# Patient Record
Sex: Female | Born: 1965
Health system: Southern US, Community
[De-identification: ages and names within clinical notes are randomized; demographics above are authoritative.]

## PROBLEM LIST (undated history)

## (undated) DIAGNOSIS — G4733 Obstructive sleep apnea (adult) (pediatric): Secondary | ICD-10-CM

## (undated) DIAGNOSIS — E785 Hyperlipidemia, unspecified: Secondary | ICD-10-CM

## (undated) DIAGNOSIS — R112 Nausea with vomiting, unspecified: Secondary | ICD-10-CM

## (undated) DIAGNOSIS — E0590 Thyrotoxicosis, unspecified without thyrotoxic crisis or storm: Secondary | ICD-10-CM

## (undated) DIAGNOSIS — G2581 Restless legs syndrome: Secondary | ICD-10-CM

## (undated) DIAGNOSIS — K219 Gastro-esophageal reflux disease without esophagitis: Secondary | ICD-10-CM

## (undated) DIAGNOSIS — J189 Pneumonia, unspecified organism: Secondary | ICD-10-CM

## (undated) DIAGNOSIS — I498 Other specified cardiac arrhythmias: Secondary | ICD-10-CM

## (undated) DIAGNOSIS — E538 Deficiency of other specified B group vitamins: Secondary | ICD-10-CM

## (undated) DIAGNOSIS — S129XXA Fracture of neck, unspecified, initial encounter: Secondary | ICD-10-CM

## (undated) DIAGNOSIS — D510 Vitamin B12 deficiency anemia due to intrinsic factor deficiency: Secondary | ICD-10-CM

## (undated) DIAGNOSIS — K760 Fatty (change of) liver, not elsewhere classified: Secondary | ICD-10-CM

## (undated) DIAGNOSIS — Z136 Encounter for screening for cardiovascular disorders: Secondary | ICD-10-CM

## (undated) DIAGNOSIS — G709 Myoneural disorder, unspecified: Secondary | ICD-10-CM

## (undated) DIAGNOSIS — G901 Familial dysautonomia [Riley-Day]: Secondary | ICD-10-CM

## (undated) DIAGNOSIS — G90A Postural orthostatic tachycardia syndrome (POTS): Secondary | ICD-10-CM

## (undated) DIAGNOSIS — I10 Essential (primary) hypertension: Secondary | ICD-10-CM

## (undated) DIAGNOSIS — N281 Cyst of kidney, acquired: Secondary | ICD-10-CM

## (undated) DIAGNOSIS — I951 Orthostatic hypotension: Secondary | ICD-10-CM

## (undated) DIAGNOSIS — R000 Tachycardia, unspecified: Secondary | ICD-10-CM

## (undated) DIAGNOSIS — Z9889 Other specified postprocedural states: Secondary | ICD-10-CM

## (undated) DIAGNOSIS — K5792 Diverticulitis of intestine, part unspecified, without perforation or abscess without bleeding: Secondary | ICD-10-CM

## (undated) DIAGNOSIS — I499 Cardiac arrhythmia, unspecified: Secondary | ICD-10-CM

## (undated) DIAGNOSIS — E041 Nontoxic single thyroid nodule: Secondary | ICD-10-CM

## (undated) DIAGNOSIS — R569 Unspecified convulsions: Secondary | ICD-10-CM

## (undated) DIAGNOSIS — G4730 Sleep apnea, unspecified: Secondary | ICD-10-CM

## (undated) DIAGNOSIS — K7460 Unspecified cirrhosis of liver: Secondary | ICD-10-CM

## (undated) DIAGNOSIS — K7581 Nonalcoholic steatohepatitis (NASH): Secondary | ICD-10-CM

## (undated) DIAGNOSIS — B2790 Infectious mononucleosis, unspecified without complication: Secondary | ICD-10-CM

## (undated) HISTORY — DX: Diverticulitis of intestine, part unspecified, without perforation or abscess without bleeding: K57.92

## (undated) HISTORY — DX: Nontoxic single thyroid nodule: E04.1

## (undated) HISTORY — PX: CHOLECYSTECTOMY: SHX55

## (undated) HISTORY — DX: Hyperlipidemia, unspecified: E78.5

## (undated) HISTORY — DX: Infectious mononucleosis, unspecified without complication: B27.90

## (undated) HISTORY — DX: Deficiency of other specified B group vitamins: E53.8

## (undated) HISTORY — DX: Unspecified cirrhosis of liver: K74.60

## (undated) HISTORY — DX: Fracture of neck, unspecified, initial encounter: S12.9XXA

## (undated) HISTORY — PX: APPENDECTOMY: SHX54

## (undated) HISTORY — DX: Restless legs syndrome: G25.81

## (undated) HISTORY — PX: ABDOMINAL HYSTERECTOMY: SHX81

## (undated) HISTORY — DX: Encounter for screening for cardiovascular disorders: Z13.6

## (undated) HISTORY — DX: Nonalcoholic steatohepatitis (NASH): K75.81

## (undated) HISTORY — DX: Cyst of kidney, acquired: N28.1

## (undated) HISTORY — DX: Familial dysautonomia (riley-day): G90.1

## (undated) HISTORY — DX: Gastro-esophageal reflux disease without esophagitis: K21.9

## (undated) HISTORY — DX: Obstructive sleep apnea (adult) (pediatric): G47.33

## (undated) HISTORY — DX: Sleep apnea, unspecified: G47.30

## (undated) HISTORY — DX: Vitamin B12 deficiency anemia due to intrinsic factor deficiency: D51.0

## (undated) HISTORY — PX: CARDIOVASCULAR STRESS TEST: SHX262

## (undated) HISTORY — PX: ANKLE SURGERY: SHX546

## (undated) HISTORY — PX: TONSILLECTOMY: SUR1361

## (undated) HISTORY — PX: OTHER SURGICAL HISTORY: SHX169

## (undated) HISTORY — PX: TONSILLECTOMY: SHX5217

## (undated) HISTORY — PX: CARDIAC CATHETERIZATION: SHX172

## (undated) HISTORY — PX: DOPPLER ECHOCARDIOGRAPHY: SHX263

---

## 1997-10-10 ENCOUNTER — Encounter: Payer: Self-pay | Admitting: Family Medicine

## 1997-10-10 LAB — CONVERTED CEMR LAB: Pap Smear: NORMAL

## 1999-03-11 ENCOUNTER — Ambulatory Visit (HOSPITAL_COMMUNITY): Admission: RE | Admit: 1999-03-11 | Discharge: 1999-03-11 | Payer: Self-pay | Admitting: Family Medicine

## 2000-01-12 DIAGNOSIS — Z136 Encounter for screening for cardiovascular disorders: Secondary | ICD-10-CM

## 2000-01-12 HISTORY — DX: Encounter for screening for cardiovascular disorders: Z13.6

## 2000-01-15 ENCOUNTER — Encounter: Admission: RE | Admit: 2000-01-15 | Discharge: 2000-01-15 | Payer: Self-pay | Admitting: Family Medicine

## 2000-01-15 ENCOUNTER — Encounter: Payer: Self-pay | Admitting: Family Medicine

## 2000-02-23 ENCOUNTER — Encounter: Payer: Self-pay | Admitting: Family Medicine

## 2000-02-23 ENCOUNTER — Encounter: Admission: RE | Admit: 2000-02-23 | Discharge: 2000-02-23 | Payer: Self-pay | Admitting: Family Medicine

## 2000-03-13 HISTORY — PX: ESOPHAGOGASTRODUODENOSCOPY: SHX1529

## 2000-06-22 ENCOUNTER — Ambulatory Visit (HOSPITAL_COMMUNITY): Admission: RE | Admit: 2000-06-22 | Discharge: 2000-06-22 | Payer: Self-pay | Admitting: Internal Medicine

## 2001-04-13 HISTORY — PX: BLADDER REPAIR: SHX76

## 2001-04-28 ENCOUNTER — Encounter: Admission: RE | Admit: 2001-04-28 | Discharge: 2001-04-28 | Payer: Self-pay | Admitting: Internal Medicine

## 2001-06-02 ENCOUNTER — Observation Stay (HOSPITAL_COMMUNITY): Admission: RE | Admit: 2001-06-02 | Discharge: 2001-06-03 | Payer: Self-pay | Admitting: Urology

## 2001-10-03 ENCOUNTER — Ambulatory Visit (HOSPITAL_BASED_OUTPATIENT_CLINIC_OR_DEPARTMENT_OTHER): Admission: RE | Admit: 2001-10-03 | Discharge: 2001-10-03 | Payer: Self-pay | Admitting: Urology

## 2002-02-28 ENCOUNTER — Encounter: Payer: Self-pay | Admitting: Family Medicine

## 2002-02-28 ENCOUNTER — Encounter: Admission: RE | Admit: 2002-02-28 | Discharge: 2002-02-28 | Payer: Self-pay | Admitting: Family Medicine

## 2002-12-13 HISTORY — PX: OTHER SURGICAL HISTORY: SHX169

## 2003-01-29 ENCOUNTER — Encounter: Payer: Self-pay | Admitting: Family Medicine

## 2003-01-29 ENCOUNTER — Emergency Department (HOSPITAL_COMMUNITY): Admission: EM | Admit: 2003-01-29 | Discharge: 2003-01-29 | Payer: Self-pay | Admitting: Emergency Medicine

## 2003-04-14 DIAGNOSIS — B2790 Infectious mononucleosis, unspecified without complication: Secondary | ICD-10-CM

## 2003-04-14 HISTORY — DX: Infectious mononucleosis, unspecified without complication: B27.90

## 2003-08-13 ENCOUNTER — Encounter: Admission: RE | Admit: 2003-08-13 | Discharge: 2003-08-13 | Payer: Self-pay | Admitting: Cardiology

## 2004-01-20 ENCOUNTER — Emergency Department (HOSPITAL_COMMUNITY): Admission: EM | Admit: 2004-01-20 | Discharge: 2004-01-20 | Payer: Self-pay

## 2004-05-09 ENCOUNTER — Ambulatory Visit: Payer: Self-pay | Admitting: Family Medicine

## 2004-06-02 ENCOUNTER — Ambulatory Visit: Payer: Self-pay | Admitting: Internal Medicine

## 2004-08-21 ENCOUNTER — Ambulatory Visit: Payer: Self-pay | Admitting: Family Medicine

## 2004-08-23 ENCOUNTER — Emergency Department: Payer: Self-pay | Admitting: Emergency Medicine

## 2004-08-26 ENCOUNTER — Other Ambulatory Visit: Admission: RE | Admit: 2004-08-26 | Discharge: 2004-08-26 | Payer: Self-pay | Admitting: Obstetrics and Gynecology

## 2004-09-05 ENCOUNTER — Emergency Department: Payer: Self-pay | Admitting: Internal Medicine

## 2004-09-15 ENCOUNTER — Encounter: Admission: RE | Admit: 2004-09-15 | Discharge: 2004-09-15 | Payer: Self-pay | Admitting: Family Medicine

## 2004-09-15 LAB — HM MAMMOGRAPHY: HM Mammogram: NORMAL

## 2005-01-15 ENCOUNTER — Ambulatory Visit: Payer: Self-pay | Admitting: Family Medicine

## 2005-01-30 ENCOUNTER — Ambulatory Visit: Payer: Self-pay | Admitting: Gastroenterology

## 2005-02-11 ENCOUNTER — Ambulatory Visit: Payer: Self-pay | Admitting: Gastroenterology

## 2005-02-11 ENCOUNTER — Encounter (INDEPENDENT_AMBULATORY_CARE_PROVIDER_SITE_OTHER): Payer: Self-pay | Admitting: Specialist

## 2005-02-17 ENCOUNTER — Encounter: Admission: RE | Admit: 2005-02-17 | Discharge: 2005-04-12 | Payer: Self-pay | Admitting: Gastroenterology

## 2005-02-18 ENCOUNTER — Ambulatory Visit: Payer: Self-pay | Admitting: Gastroenterology

## 2005-02-25 ENCOUNTER — Ambulatory Visit: Payer: Self-pay | Admitting: Gastroenterology

## 2005-03-17 ENCOUNTER — Ambulatory Visit: Payer: Self-pay | Admitting: Gastroenterology

## 2005-03-24 ENCOUNTER — Ambulatory Visit: Payer: Self-pay | Admitting: Cardiology

## 2005-04-02 ENCOUNTER — Ambulatory Visit: Payer: Self-pay | Admitting: Gastroenterology

## 2005-05-12 ENCOUNTER — Ambulatory Visit: Payer: Self-pay | Admitting: Gastroenterology

## 2005-05-21 ENCOUNTER — Ambulatory Visit: Payer: Self-pay | Admitting: Gastroenterology

## 2005-05-22 ENCOUNTER — Ambulatory Visit (HOSPITAL_COMMUNITY): Admission: RE | Admit: 2005-05-22 | Discharge: 2005-05-22 | Payer: Self-pay | Admitting: Gastroenterology

## 2005-05-25 ENCOUNTER — Ambulatory Visit (HOSPITAL_COMMUNITY): Admission: RE | Admit: 2005-05-25 | Discharge: 2005-05-26 | Payer: Self-pay | Admitting: Urology

## 2005-11-21 ENCOUNTER — Emergency Department (HOSPITAL_COMMUNITY): Admission: EM | Admit: 2005-11-21 | Discharge: 2005-11-21 | Payer: Self-pay | Admitting: Family Medicine

## 2005-12-03 ENCOUNTER — Ambulatory Visit: Payer: Self-pay | Admitting: Family Medicine

## 2005-12-18 ENCOUNTER — Ambulatory Visit: Payer: Self-pay | Admitting: Internal Medicine

## 2005-12-30 ENCOUNTER — Ambulatory Visit: Payer: Self-pay | Admitting: Family Medicine

## 2006-02-09 ENCOUNTER — Ambulatory Visit: Payer: Self-pay | Admitting: Gastroenterology

## 2006-07-13 HISTORY — PX: CARDIOVASCULAR STRESS TEST: SHX262

## 2006-08-05 ENCOUNTER — Encounter: Payer: Self-pay | Admitting: Family Medicine

## 2006-08-17 ENCOUNTER — Encounter: Payer: Self-pay | Admitting: Family Medicine

## 2006-09-02 ENCOUNTER — Encounter: Payer: Self-pay | Admitting: Family Medicine

## 2006-09-08 ENCOUNTER — Encounter: Payer: Self-pay | Admitting: Family Medicine

## 2006-09-08 DIAGNOSIS — R079 Chest pain, unspecified: Secondary | ICD-10-CM | POA: Insufficient documentation

## 2006-09-08 DIAGNOSIS — E785 Hyperlipidemia, unspecified: Secondary | ICD-10-CM | POA: Insufficient documentation

## 2006-09-08 DIAGNOSIS — E538 Deficiency of other specified B group vitamins: Secondary | ICD-10-CM | POA: Insufficient documentation

## 2006-09-08 DIAGNOSIS — K219 Gastro-esophageal reflux disease without esophagitis: Secondary | ICD-10-CM | POA: Insufficient documentation

## 2006-09-08 DIAGNOSIS — G4733 Obstructive sleep apnea (adult) (pediatric): Secondary | ICD-10-CM | POA: Insufficient documentation

## 2006-09-08 DIAGNOSIS — G43909 Migraine, unspecified, not intractable, without status migrainosus: Secondary | ICD-10-CM | POA: Insufficient documentation

## 2006-09-08 DIAGNOSIS — N3946 Mixed incontinence: Secondary | ICD-10-CM | POA: Insufficient documentation

## 2006-09-09 ENCOUNTER — Ambulatory Visit: Payer: Self-pay | Admitting: Family Medicine

## 2006-09-09 DIAGNOSIS — I10 Essential (primary) hypertension: Secondary | ICD-10-CM | POA: Insufficient documentation

## 2006-09-09 DIAGNOSIS — R7989 Other specified abnormal findings of blood chemistry: Secondary | ICD-10-CM | POA: Insufficient documentation

## 2006-09-09 DIAGNOSIS — R4589 Other symptoms and signs involving emotional state: Secondary | ICD-10-CM | POA: Insufficient documentation

## 2006-10-24 ENCOUNTER — Encounter: Payer: Self-pay | Admitting: Family Medicine

## 2006-11-04 ENCOUNTER — Encounter: Payer: Self-pay | Admitting: Family Medicine

## 2006-11-19 ENCOUNTER — Ambulatory Visit: Payer: Self-pay | Admitting: Cardiology

## 2006-12-02 ENCOUNTER — Encounter: Payer: Self-pay | Admitting: Family Medicine

## 2007-03-31 ENCOUNTER — Telehealth: Payer: Self-pay | Admitting: Family Medicine

## 2007-07-08 ENCOUNTER — Encounter: Payer: Self-pay | Admitting: Family Medicine

## 2007-07-21 ENCOUNTER — Encounter: Payer: Self-pay | Admitting: Family Medicine

## 2008-01-09 ENCOUNTER — Encounter: Payer: Self-pay | Admitting: Family Medicine

## 2008-01-25 ENCOUNTER — Encounter (INDEPENDENT_AMBULATORY_CARE_PROVIDER_SITE_OTHER): Payer: Self-pay | Admitting: *Deleted

## 2008-04-19 ENCOUNTER — Encounter: Payer: Self-pay | Admitting: Family Medicine

## 2008-06-06 ENCOUNTER — Encounter: Payer: Self-pay | Admitting: Family Medicine

## 2008-08-06 ENCOUNTER — Telehealth: Payer: Self-pay | Admitting: Family Medicine

## 2008-12-20 ENCOUNTER — Encounter: Payer: Self-pay | Admitting: Family Medicine

## 2009-04-13 ENCOUNTER — Emergency Department (HOSPITAL_COMMUNITY): Admission: EM | Admit: 2009-04-13 | Discharge: 2009-04-13 | Payer: Self-pay | Admitting: Emergency Medicine

## 2009-08-02 ENCOUNTER — Encounter: Payer: Self-pay | Admitting: Family Medicine

## 2009-08-05 ENCOUNTER — Telehealth: Payer: Self-pay | Admitting: Family Medicine

## 2009-10-22 ENCOUNTER — Encounter: Admission: RE | Admit: 2009-10-22 | Discharge: 2009-10-22 | Payer: Self-pay | Admitting: Obstetrics and Gynecology

## 2009-12-19 ENCOUNTER — Encounter: Payer: Self-pay | Admitting: Family Medicine

## 2010-05-04 ENCOUNTER — Encounter: Payer: Self-pay | Admitting: Cardiology

## 2010-05-13 NOTE — Letter (Signed)
Summary: [facility]   Imported By: Edmonia James 01/07/2010 08:19:36  _____________________________________________________________________  External Attachment:    Type:   Image     Comment:   External Document

## 2010-05-13 NOTE — Letter (Signed)
Summary: [facility]   Imported By: Edmonia James 01/10/2010 09:23:49  _____________________________________________________________________  External Attachment:    Type:   Image     Comment:   External Document

## 2010-05-13 NOTE — Progress Notes (Signed)
Summary: Rx Vitamin B12  Phone Note From Pharmacy Call back at [phone]   Caller: [facility] Call For: Dr. Glori Bickers  Summary of Call: Received fax from pharmacy stating that a refill is needed on Cyanocobalamin injection 1037mg monthly.  Patient has not had B12 levels drawn recently, please advise. Initial call taken by: NSherrian DiversCMA (Deborra Medina,  August 05, 2009 11:00 AM  Follow-up for Phone Call        that is ok- she has level done at another office  px written on EMR for call in  Follow-up by: MAllena EaringMD,  August 05, 2009 12:57 PM    Prescriptions: CYANOCOBALAMIN 1000 MCG/ML SOLN (CYANOCOBALAMIN) take injections as directed monthly  #3 months x 3   Entered by:   LChristena DeemCMA ([place]   Authorized by:   MAllena EaringMD   Signed by:   LChristena DeemCMA (Deborra Medina on 08/05/2009   Method used:   [facility]        [facility]       [address]      [place]  [address]      Ph: [phone]      Fax: [phone]  RxID:   [id number]

## 2010-06-29 LAB — BASIC METABOLIC PANEL
Calcium: 9.6 mg/dL (ref 8.4–10.5)
GFR calc non Af Amer: >60 mL/min (ref >60)
Sodium: 139 mEq/L (ref 135–145)

## 2010-06-29 LAB — CBC
HCT: 43 % (ref 36.0–46.0)
Hemoglobin: 14.8 g/dL (ref 12.0–15.0)
Platelets: 232 10*3/uL (ref 150–400)
WBC: 6.8 10*3/uL (ref 4.0–10.5)

## 2010-06-29 LAB — DIFFERENTIAL
Eosinophils Relative: 2 % (ref 0–5)
Lymphocytes Relative: 30 % (ref 12–46)
Lymphs Abs: 2.1 10*3/uL (ref 0.7–4.0)
Monocytes Absolute: 0.4 10*3/uL (ref 0.1–1.0)
Monocytes Relative: 6 % (ref 3–12)
Neutro Abs: 4.2 10*3/uL (ref 1.7–7.7)

## 2010-08-14 ENCOUNTER — Ambulatory Visit (INDEPENDENT_AMBULATORY_CARE_PROVIDER_SITE_OTHER): Payer: BC Managed Care – PPO | Admitting: Family Medicine

## 2010-08-14 ENCOUNTER — Encounter: Payer: Self-pay | Admitting: Family Medicine

## 2010-08-14 ENCOUNTER — Telehealth: Payer: Self-pay | Admitting: Gastroenterology

## 2010-08-14 ENCOUNTER — Ambulatory Visit: Payer: Self-pay | Admitting: Family Medicine

## 2010-08-14 VITALS — BP 116/82 | HR 92 | Temp 98.4°F | Wt 199.0 lb

## 2010-08-14 DIAGNOSIS — R109 Unspecified abdominal pain: Secondary | ICD-10-CM

## 2010-08-14 LAB — CBC WITH DIFFERENTIAL/PLATELET
Basophils Absolute: 0 10*3/uL (ref 0.0–0.1)
Basophils Relative: 0 % (ref 0–1)
Eosinophils Absolute: 0.1 10*3/uL (ref 0.0–0.7)
Eosinophils Relative: 1 % (ref 0–5)
HCT: 45.3 % (ref 36.0–46.0)
Hemoglobin: 15.7 g/dL — ABNORMAL HIGH (ref 12.0–15.0)
Lymphocytes Relative: 35 % (ref 12–46)
MCHC: 34.7 g/dL (ref 30.0–36.0)
Monocytes Absolute: 0.5 10*3/uL (ref 0.1–1.0)
Monocytes Relative: 7 % (ref 3–12)
Neutro Abs: 4.2 10*3/uL (ref 1.7–7.7)
Neutrophils Relative %: 57 % (ref 43–77)
RBC: 5.17 MIL/uL — ABNORMAL HIGH (ref 3.87–5.11)
WBC: 7.3 10*3/uL (ref 4.0–10.5)

## 2010-08-14 LAB — POCT URINALYSIS DIPSTICK
Leukocytes, UA: NEGATIVE
Protein, UA: NEGATIVE
Urobilinogen, UA: NEGATIVE
pH, UA: 6

## 2010-08-14 LAB — COMPREHENSIVE METABOLIC PANEL
ALT: 46 U/L — ABNORMAL HIGH (ref 0–35)
CO2: 31 mEq/L (ref 19–32)
Calcium: 10.3 mg/dL (ref 8.4–10.5)
Chloride: 98 mEq/L (ref 96–112)
Creat: 0.83 mg/dL (ref 0.40–1.20)
Glucose, Bld: 148 mg/dL — ABNORMAL HIGH (ref 70–99)
Total Protein: 7.4 g/dL (ref 6.0–8.3)

## 2010-08-14 NOTE — Patient Instructions (Signed)
See Rosaria Ferries about your referral before your leave today. I'll talk to you when they call the report to me.

## 2010-08-14 NOTE — Telephone Encounter (Signed)
Per patient's chart in [facility], she has been seeing Dr. Thana Farr at [facility]. Called patient and requested she call them first for this problem.

## 2010-08-14 NOTE — Assessment & Plan Note (Signed)
>  45 min spent with patient counseling and coordinating care.  Labs reviewed.  She was sent for CT and I have d/w radiology and pt.  She has no overt signs of acute appendicitis, but she does have a prominent appendix with mild LA per report in the RLQ.  I have advised pt to go to ER and have eval there, esp given her h/o RLQ pain.  She may benefit from surgical eval.  I have called the [facility] ER about patient's pending arrival.  I will await further recs from [facility].  I appreciate help of all involved.

## 2010-08-14 NOTE — Progress Notes (Signed)
Abdominal pain.  H/o NASH followed at [facility].  Last week with some pain near the umbilicus that radiated to the R side.  She had had RUQ pain like that before (but not the pain at umbilicus) and now she had some RLQ pain additionally.  Better now than this AM.  H/o diverticulitis 2 years ago, LLQ.  She felt episodically hot but had no fevers over the last few days.  No nausea now, had some prev.  No vomiting.  No change in stools. No BRBPR.  No dark tarry stools.  No burning with urination.  S/p hysterectomy.    Meds, vitals, and allergies reviewed.   ROS: See HPI.  Otherwise, noncontributory.  nad but uncomfortable. Mmm rrr ctab Abdomen soft, ttp at umbilicus and RUQ, more ttp in RLQ.  No peritoneal sign.  Normal bs Ext well perfused

## 2010-08-15 ENCOUNTER — Encounter: Payer: Self-pay | Admitting: Family Medicine

## 2010-08-15 ENCOUNTER — Observation Stay: Payer: Self-pay | Admitting: General Surgery

## 2010-08-15 DIAGNOSIS — K37 Unspecified appendicitis: Secondary | ICD-10-CM | POA: Insufficient documentation

## 2010-08-19 ENCOUNTER — Encounter: Payer: Self-pay | Admitting: Family Medicine

## 2010-08-19 ENCOUNTER — Ambulatory Visit: Payer: Self-pay | Admitting: General Surgery

## 2010-08-29 NOTE — Op Note (Signed)
[facility]  Patient:    [name], [name] Visit Number: [id number] MRN: [id number]          Service Type: Attending:  Shane Crutch. Gaynelle Arabian, M.D. Dictated by:   Shane Crutch Gaynelle Arabian, M.D. Proc. Date: 06/02/01   CC:         Mardene Celeste A. Rocky Link, M.D. [facility]   Operative Report  PREOPERATIVE DIAGNOSES: 1. Urinary incontinence. 2. Vaginal vault prolapse.  POSTOPERATIVE DIAGNOSES: 1. Urinary incontinence. 2. Vaginal vault prolapse.  OPERATION:  SPARC pubovaginal sling and anterior vaginal vault repair.  SURGEON:  Sigmund I. Gaynelle Arabian, M.D.  ANESTHESIA:  General (LMA).  PREPARATION:  After appropriate preanesthesia, the patient is brought to the operating room and placed on the operating table in the dorsal supine position where a general LMA anesthesia was introduced.  She was re-placed in the low Allen stirrup dorsal lithotomy position where the pubis was prepped with Betadine solution and draped in the usual fashion.  REVIEW OF HISTORY:  This 45 year old female, had a combination of hysterectomy and bladder repair with apparent anterior vaginal vault repair and Raz urethropexy in [place], [place], in approximately 1994.  The patient was apparently obstructed at that time, developing severe urinary urgency and frequency.  She was seen at [facility] postoperatively and had not done well since that time.  She has remained with urge incontinence and currently complains of a mixed stress and urge incontinence.  She was originally evaluated in 1998, with a diagnosis of type 3 urinary incontinence, losing urine when she coughs, laughs, and sneezes.  She was not prepared at that time to have surgical repair but has since been counseled to have a permanent urethropexy and anterior vaginal vault repair.  The patient does not smoke, and was ______.  She did have significant bleeding with her prior urologic procedure, necessitating  transfusion.  Note that the patient has a stable, compliant bladder on urodynamics with a leak point pressure of only 16 cc per second and a total bladder volume of 400 cc.  DESCRIPTION OF PROCEDURE:  A total of 8 cc of 0.5% plain Marcaine with epinephrine 1:200,000 was injected in two areas, in the mid urethra area, and also along the longitudinal portion of the vaginal vault prolapse.  Incision was made vertically, subcutaneous tissue dissected laterally.  Two separate incisions were then made in the pubic area, approximately two fingerbreadths from the midline on either side.  Each incision was approximately 1 cm in length.  The [facility] needles were then placed retropubically and through the mid urethral individual.  Once both needles were in position, cystoscopy was performed which showed no suture within the bladder or the urethra.  The Foley was replaced, and the [facility] sling was placed without difficulty, cut in perfect position.  It is noted that the sling was placed with a right-angle clamp behind the urethral loop at all times, and there was a loop left in the [facility] material after placement.  The sides were then cut below the skin and subcu fat layer and could not be palpated or visualized from the suprapubic wound sites.  The wound sites were closed with 4-0 Vicryl.  Benzoin and Steri-Strips and sterile dressing.  It was elected to place a 2 cm x 7 cm portion of Tutoplast fascia over the sling, and this was accomplished without difficulty.  The incision site was then closed in two layers with 4-0 Vicryl suture.  Incision was then made in the anterior vaginal prolapse longitudinally, subcutaneous tissue dissected  lateralward.  Three separate 3-0 Vicryl horizontal mattress sutures were then placed, reducing the cystocele, and a 4 cm x 7 cm portion of Tutoplast fascia was then used to cover the repair. The anterior vaginal wall was then closed in two layers with 3-0  Vicryl suture, interrupted and running.  Following this, vaginal packing was placed. The patient was given IV Toradol, awakened, and taken to the recovery room in good condition. Dictated by:   Shane Crutch Gaynelle Arabian, M.D. Attending:  Shane Crutch. Gaynelle Arabian, M.D. DD:  06/02/01 TD:  06/02/01 Job: [id number]

## 2010-08-29 NOTE — Procedures (Signed)
[facility]  Patient:    [name], [name]                     MRN: [id number] Proc. Date: 06/29/00 Adm. Date:  [id number] Disc. Date: [id number] Attending:  Donnajean Lopes CC:         Loura Pardon, M.D. [facility]   Procedure Report  PROCEDURE PERFORMED:  Esophageal manometry.  1 - Upper esophageal sphincter.  Appears to be normal coordination between pharyngeal contraction and cricopharyngeal relaxation.  2 - Lower esophageal sphincter.  Mean pressure is normal at 14 mmHg with normal relaxation to swallowing.  3 - Motility pattern.  There are normally propagated peristaltic waves of normal amplitude and duration throughout the length of the esophagus.  Mean distal esophageal amplitude is 104 mmHg and there is 100% peristalsis.  ASSESSMENT:  This is normal esophageal manometry without evidence of esophageal motility disorder to account for this patients atypical chest pain.  She also had a normal 24-hour pH probe study performed in our office on May 24, 2000.  RECOMMENDATIONS:  I will refer this patient back to primary care for further evaluation.  I can see no evidence of esophageal abnormalities in this patient.  She also had normal endoscopy performed on March 31, 2000. DD:  06/29/00 TD:  06/30/00 Job: [id number]

## 2010-08-29 NOTE — Op Note (Signed)
[facility]  Patient:    [name], [name] Visit Number: [id number] MRN: [id number]          Service Type: NES Location: [place] Attending Physician:  Veverly Fells Dictated by:   Shane Crutch Gaynelle Arabian, M.D. Proc. Date: 10/03/01 Admit Date:  10/03/2001                             Operative Report  PREOPERATIVE DIAGNOSIS:  Sphincteric urinary incontinence.  POSTOPERATIVE DIAGNOSIS:  Sphincteric urinary incontinence.  OPERATION PERFORMED:  Injection of collagen periurethrally and vaginal scar revision.  SURGEON:  Sigmund I. Gaynelle Arabian, M.D.  ANESTHESIA:  General (standby).  PREPARATION:  After appropriate preanesthesia, the patient was placed on the operating table in the dorsal supine position. The patient had IV sedation given, and her legs were placed in the dorsal lithotomy position where the pubis was prepped with Betadine solution and draped in the usual fashion.  DESCRIPTION OF PROCEDURE:  Vaginal inspection revealed that the patient had immediate neurogenic component to her bladder, with spasming of urine and incontinence when sedated. Her recent pubovaginal sling appeared in good position, but the posterior portion of her vaginal vault appeared quite weak.  Cystoscopy was accomplished, and two vials of collagen were injected circumferentially, yielding a donut shaped mucosal form, which definitely stops the incontinence. In addition, scar was identified from previous sling, and 1 cc of 0.5% Marcaine with epinephrine 1:200,000 was injected into the scarred area, and scar revision was accomplished with incision of the scar, with the finding of scar tissue within. Debridement was accomplished and revision of the scar was accomplished. This area was closed with running 4-0 Vicryl suture.  The patient was given a B&O suppository, awakened and taken to the recovery room in good condition. Dictated by:   Shane Crutch Gaynelle Arabian,  M.D. Attending Physician:  Veverly Fells DD:  10/03/01 TD:  10/03/01 Job: [id number]

## 2010-08-29 NOTE — Letter (Signed)
February 09, 2006    Kara Locket. Love, M.D.  [address], [place] [address]   RE:  [name], [name]  MRN:  [id number]  /  DOB:  [birth year]   Dear Clair Gulling:   I would appreciate it if you would see my most pleasant patient, [name], for evaluation of her neurological complaints.   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  Her liver enzymes have normalized with rather progressive slow  weight loss.  She also has acid reflux symptoms for which she takes AcipHex  20 mg a day.  As part of her workup she has been found to have pernicious  anemia with positive serologies and low B12 levels that have responded to  B12 parenteral replacement therapy.  She also has a history of essential  hypertension and takes Cardizem 180 mg a day and hydrochlorothiazide daily.  She is status post hysterectomy.   She comes in today complaining of dizziness, fatigue, right leg weakness,  and also apparently her family and she have both noticed problems with  expressive aphasia.  She generally does not feel well and is chronically  fatigued.  Her weight today is 164 pounds, which is down some 30 pounds from  several years ago.  Blood pressure is 110/76 and pulse was 72 and regular.  Her chest was clear and she appeared to be a in regular rhythm without  murmurs, gallops or rubs.  There was no hepatosplenomegaly, abdominal masses  or tenderness.   I have sent Izora Gala by the lab to check a followup CBC and liver profile.  I  have asked her to see you for further neurological evaluation to exclude MS  or some other type of demyelinating neurological syndrome.  I look forward  to your evaluation and will continue her other medications as listed above.    Sincerely,      Loralee Pacas. Sharlett Iles, MD, Quentin Ore,  FAGA    DRP/MedQ  DD: 02/09/2006  DT: 02/09/2006  Job #: [id number]   CC:    Marne A. Glori Bickers, MD

## 2010-08-29 NOTE — Op Note (Signed)
[name], [name]                 ACCOUNT NO.:  [id number]   MEDICAL RECORD NO.:  [id number]          PATIENT TYPE:  OIB   LOCATION:  8144                         FACILITY:  [facility]   PHYSICIAN:  Sigmund I. Gaynelle Arabian, M.D.DATE OF BIRTH:  October 03, 1965   DATE OF PROCEDURE:  05/25/2005  DATE OF DISCHARGE:                                 OPERATIVE REPORT   PREOPERATIVE DIAGNOSIS:  Recurrent stress urinary continence.   POSTOPERATIVE DIAGNOSIS:  Recurrent stress urinary continence.   OPERATIONS:  [facility] [employer] pubovaginal sling.   SURGEON:  Sigmund I. Gaynelle Arabian, M.D.   ANESTHESIA:  General LMA.   PREPARATION:  After appropriate preanesthesia, the patient is brought to the  operating room, placed on the operating table in the dorsal supine position  where general LMA anesthesia was introduced. She was then replaced in dorsal  lithotomy position where the pubis was prepped with Betadine solution and  draped in usual fashion.   REVIEW OF HISTORY:  This 45 year old female, has a history of recurrent  stress urinary incontinence, despite SPARC pubovaginal sling in 2001. She  has a weak point pressure of only 54 cm of water with severe volume loss  with coughing and straining. She is now for repeat pubovaginal sling.   DESCRIPTION OF PROCEDURE:  With the patient in dorsal lithotomy position and  the pubis prepped and draped in the usual fashion, 4 mL of  Marcaine 0.5%  was injected into the mid urethral tissue. An incision was made in the mid  urethra and scar tissue was dissected retropubically. A large amount of scar  tissue was noted to be present, except in the retropubic space, where there  was minimal.   Two previously made incision sites were identified, and reincised  suprapubically, and the Ireland needle was placed retropubically in front of an  empty bladder, behind the pubic symphysis. With both needles in place,  cystoscopy was accomplished, and showed that there was  no evidence of any  damage to the bladder.   At no time we will was the [facility] mesh identifiable.   The Ireland mesh was placed without difficulty, and placed behind a right-angle  clamp for tensioning. The Ireland wings were then cut subcutaneously, and after  antibiotic irrigation, the periurethral wound closed in a single layer  with running 3-0 Vicryl suture. The suprapubic wounds were closed with  Dermabond. The patient tolerated the procedure well and was awakened and  taken to the recovery room in good condition. The Foley was left in place  and packing was left in place.      Sigmund I. Gaynelle Arabian, M.D.  Electronically Signed     SIT/MEDQ  D:  05/25/2005  T:  05/25/2005  Job:  [id number]

## 2010-09-02 ENCOUNTER — Other Ambulatory Visit: Payer: Self-pay | Admitting: *Deleted

## 2010-09-02 NOTE — Telephone Encounter (Signed)
Do sched f/u this summer  I thought her B12 was monthly instead of weekly ? -- when did it change? , am I just confused ?

## 2010-09-02 NOTE — Telephone Encounter (Signed)
I thought B12 shot was once monthly instead of weekly? - please update me F/u this summer

## 2010-09-02 NOTE — Telephone Encounter (Signed)
[facility] [place].  Pt is due for follow up, has only been in for one sick visit in last couple of years.

## 2010-09-04 NOTE — Telephone Encounter (Signed)
Left message on pt cell phone to call our office back. (home # disconnected and work# gentleman answered did not know who pt was).

## 2010-09-05 NOTE — Telephone Encounter (Signed)
Patient notified as instructed by telephone. Pt said she has been on B12 injection once a week for years. Pt cannot remember if Dr Sharlett Iles or neurologist or gastroenterologist at [facility] changed her to weekly injections. Pt said she has pernicious anemia. Pt has med to give injections and said next week would be fine for refill. I explained to pt I would call her back next week.

## 2010-09-05 NOTE — Telephone Encounter (Signed)
If she is unreachable -- cannot fill px -- please ask pharmacist to get [place] a working phone # for her Thanks

## 2010-09-08 NOTE — Telephone Encounter (Signed)
I really need the note from that doctor documenting that frequent of an injection and also find out why we are px it .... Were not doing before? Or am I wrong?

## 2010-09-09 ENCOUNTER — Ambulatory Visit: Payer: Self-pay | Admitting: General Surgery

## 2010-09-09 NOTE — Telephone Encounter (Signed)
Left message on cell phone for pt to call back. No answer at work # and home phone disconnected.

## 2010-09-09 NOTE — Telephone Encounter (Signed)
Left message for pt to call back  °

## 2010-09-10 LAB — PATHOLOGY REPORT

## 2010-09-10 NOTE — Telephone Encounter (Signed)
Patient notified as instructed by telephone. Pt is not sure how the refills started coming to Dr Glori Bickers and is not which dr ordered weekly. Pt said she has to have weekly b12 because her liver does not store B12 due to her pernicious anemia and Karlene Lineman. Pt said Dr Erling Cruz the neurologist had told her she had to have B12 injections so pt will contact Dr. Tressia Danas office to see if he will prescribe B12 injections weekly. If pt has any problem getting refill she will call our office back.

## 2010-09-18 ENCOUNTER — Other Ambulatory Visit: Payer: Self-pay | Admitting: *Deleted

## 2010-09-18 NOTE — Telephone Encounter (Signed)
Pt was to call the doctor that had prescribed med initially because of frequency of med. Notified [facility] also.

## 2010-09-18 NOTE — Telephone Encounter (Signed)
Rena--I don't fully remember-- but wasn't she going to get this from her neurologist or other specialist ?

## 2010-11-17 ENCOUNTER — Ambulatory Visit (INDEPENDENT_AMBULATORY_CARE_PROVIDER_SITE_OTHER): Payer: BC Managed Care – PPO | Admitting: Family Medicine

## 2010-11-17 ENCOUNTER — Telehealth: Payer: Self-pay | Admitting: *Deleted

## 2010-11-17 ENCOUNTER — Encounter: Payer: Self-pay | Admitting: Family Medicine

## 2010-11-17 LAB — BASIC METABOLIC PANEL: Glucose: 164 mg/dL

## 2010-11-17 NOTE — Telephone Encounter (Signed)
I agree if Dr. Glori Bickers agrees.

## 2010-11-17 NOTE — Patient Instructions (Signed)
We'll contact you with your lab report. Don't change your meds for now.  Take care.

## 2010-11-17 NOTE — Telephone Encounter (Signed)
Patient would like to switch from Dr. Glori Bickers to Dr. Damita Dunnings. Please advise.

## 2010-11-17 NOTE — Progress Notes (Signed)
She had elevated calcium and sugar at [facility].  She has NASH and is followed at [facility].  Repeat sugar and renal panel was done this AM at [facility].  I don't have the records from that yet.    She needs follow up on the calcium here today.  On HCTZ, dose recently cut to 12.37m a day.  On vitamin D occ.  On moranga, and that supplement has some extra calcium.  Occ feeling of soreness in arms but no muscle spasms.  No FCNAVD.  She's having some sensation of palpitations and is in the midst of w/u with cards.    Meds, vitals, and allergies reviewed.   ROS: See HPI.  Otherwise, noncontributory.  PMH and SH reviewed.  GEN: nad, alert and oriented HEENT: mucous membranes moist NECK: supple w/o LA CV: rrr. PULM: ctab, no inc wob ABD: soft, +bs EXT: no edema SKIN: no acute rash

## 2010-11-18 ENCOUNTER — Encounter: Payer: Self-pay | Admitting: Family Medicine

## 2010-11-18 LAB — HEPATIC FUNCTION PANEL
ALT: 27 U/L (ref 0–35)
AST: 23 U/L (ref 0–37)
Albumin: 4.6 g/dL (ref 3.5–5.2)
Alkaline Phosphatase: 33 U/L — ABNORMAL LOW (ref 39–117)

## 2010-11-18 LAB — PTH, INTACT AND CALCIUM: PTH: 16.8 pg/mL (ref 14.0–72.0)

## 2010-11-18 NOTE — Telephone Encounter (Signed)
That is fine with me.

## 2010-11-18 NOTE — Assessment & Plan Note (Signed)
>  25 min spent with face to face with patient, >50% counseling.  This could be due to false elevation, inc in dietary intake (ie supplement), dec in excretion (ie due to renal/HCTZ), or bone turnover (will check PTH and LFTs).  All d/w pt.  Will address on f/u labs.  Okay for outpatient f/u.

## 2010-11-19 NOTE — Telephone Encounter (Signed)
Left message for patient to return my call.

## 2010-11-24 ENCOUNTER — Telehealth: Payer: Self-pay | Admitting: Family Medicine

## 2010-11-24 ENCOUNTER — Encounter: Payer: Self-pay | Admitting: Family Medicine

## 2010-11-24 ENCOUNTER — Ambulatory Visit (INDEPENDENT_AMBULATORY_CARE_PROVIDER_SITE_OTHER): Payer: BC Managed Care – PPO | Admitting: Family Medicine

## 2010-11-24 VITALS — BP 122/80 | HR 76 | Temp 98.5°F | Wt 190.8 lb

## 2010-11-24 DIAGNOSIS — R739 Hyperglycemia, unspecified: Secondary | ICD-10-CM

## 2010-11-24 DIAGNOSIS — R7309 Other abnormal glucose: Secondary | ICD-10-CM

## 2010-11-24 DIAGNOSIS — R7989 Other specified abnormal findings of blood chemistry: Secondary | ICD-10-CM

## 2010-11-24 DIAGNOSIS — F411 Generalized anxiety disorder: Secondary | ICD-10-CM

## 2010-11-24 DIAGNOSIS — F419 Anxiety disorder, unspecified: Secondary | ICD-10-CM

## 2010-11-24 LAB — HEMOGLOBIN A1C: Hgb A1c MFr Bld: 6.1 % (ref 4.6–6.5)

## 2010-11-24 NOTE — Telephone Encounter (Signed)
D/w pt today at [place].

## 2010-11-24 NOTE — Progress Notes (Signed)
Had labs done at [facility].  Glucose 164 on 09/18/10, fasting.  Her K was elevated and it was redrawn- this result is pending.  She was told to stop her supplements.  I don't have the repeat value yet.  She has NASH and had coffee with cream this AM.  She is already on a low card diet.   She had f/u with the cards pending about the monitor for palpitations.  Per patient she had PVCs on monitor.  Pt was advised to f/u here about her symptoms.  She is fatigued, with episodes of chest tightness and SOB.  She was asked by cards about possible anxiety dx.  She has job related pressures.  No SI/HI.    Meds, vitals, and allergies reviewed.   ROS: See HPI.  Otherwise, noncontributory.  GEN: nad, alert and oriented, wearing cardiac monitor NECK: supple w/o LA CV: rrr. PULM: ctab, no inc wob ABD: soft, +bs EXT: no edema SKIN: no acute rash

## 2010-11-24 NOTE — Patient Instructions (Signed)
You can get your results through our phone system.  Follow the instructions on the blue card. I want you to get a physical set up here after you have your appointment at [facility] (ie this fall). Take care.

## 2010-11-24 NOTE — Telephone Encounter (Signed)
Please call pt.  After I have her labs from cardiology (and after she's had her f/u at [facility]), I'd like her to be schedule for CPE.  Thanks.

## 2010-11-24 NOTE — Telephone Encounter (Signed)
Please call pt.  She is nearly diabetic.  Likely prediabetic.  She doesn't needs meds for sugar at this point.  Goal is diet/exercise and then follow up at [facility].  It is very possible that her symptoms could be related to/exacerbated by anxiety.  One options is counseling, and/or another would be medicine.  I would very much consider counseling first (ie with Dr. Rexene Edison or another psychologist).  I would try that first before starting other meds.  Thanks.

## 2010-11-25 NOTE — Assessment & Plan Note (Signed)
D/w pt.  See notes on labs.

## 2010-11-25 NOTE — Telephone Encounter (Signed)
Please notify pt.  I saw the [place] labs.  Her calcium was one tenth of a point above normal.  Given the labs done here, this doesn't need further w/u.  Thanks.  I'll await update from patient.

## 2010-11-25 NOTE — Telephone Encounter (Signed)
Pt advised.

## 2010-11-25 NOTE — Telephone Encounter (Signed)
Patient notified as instructed by telephone. Patient stated that she would like to talk with her husband about this before making a decision as to what to do. Patient stated that she has an appointment at [facility] in two weeks and will touch base with you after that visit. Patient wanted you to know that she got a call from Dr. Theodosia Blender office yesterday and was told that her calcium level is elevated. Patient stated that she requested that they send you a copy of the lab work. Patient said that she will call back in two weeks after her appointment at [facility].

## 2010-11-25 NOTE — Assessment & Plan Note (Signed)
>  25 min spent with face to face with patient, >50% counseling.  Possible dx with palpitations.  Benign exam o/w.  See phone note.

## 2010-12-08 ENCOUNTER — Emergency Department (HOSPITAL_COMMUNITY): Payer: BC Managed Care – PPO

## 2010-12-08 ENCOUNTER — Observation Stay (HOSPITAL_COMMUNITY)
Admission: EM | Admit: 2010-12-08 | Discharge: 2010-12-10 | DRG: 143 | Disposition: A | Discharge status: Home / Self Care | Payer: BC Managed Care – PPO | Attending: Emergency Medicine | Admitting: Emergency Medicine

## 2010-12-08 DIAGNOSIS — R32 Unspecified urinary incontinence: Secondary | ICD-10-CM | POA: Insufficient documentation

## 2010-12-08 DIAGNOSIS — R079 Chest pain, unspecified: Principal | ICD-10-CM | POA: Insufficient documentation

## 2010-12-08 DIAGNOSIS — K7689 Other specified diseases of liver: Secondary | ICD-10-CM | POA: Insufficient documentation

## 2010-12-08 DIAGNOSIS — R5381 Other malaise: Secondary | ICD-10-CM | POA: Insufficient documentation

## 2010-12-08 DIAGNOSIS — R5383 Other fatigue: Secondary | ICD-10-CM | POA: Insufficient documentation

## 2010-12-08 DIAGNOSIS — K219 Gastro-esophageal reflux disease without esophagitis: Secondary | ICD-10-CM | POA: Insufficient documentation

## 2010-12-08 DIAGNOSIS — R0602 Shortness of breath: Secondary | ICD-10-CM | POA: Insufficient documentation

## 2010-12-08 DIAGNOSIS — I10 Essential (primary) hypertension: Secondary | ICD-10-CM | POA: Insufficient documentation

## 2010-12-08 LAB — COMPREHENSIVE METABOLIC PANEL
Albumin: 4.2 g/dL (ref 3.5–5.2)
Alkaline Phosphatase: 39 U/L (ref 39–117)
BUN: 16 mg/dL (ref 6–23)
CO2: 30 mEq/L (ref 19–32)
Chloride: 102 mEq/L (ref 96–112)
Creatinine, Ser: 0.77 mg/dL (ref 0.50–1.10)
GFR calc Af Amer: >60 mL/min (ref >60)
GFR calc non Af Amer: >60 mL/min (ref >60)
Glucose, Bld: 137 mg/dL — ABNORMAL HIGH (ref 70–99)
Potassium: 3.8 mEq/L (ref 3.5–5.1)
Total Bilirubin: 1 mg/dL (ref 0.3–1.2)

## 2010-12-08 LAB — CBC
Hemoglobin: 14.7 g/dL (ref 12.0–15.0)
MCH: 30.1 pg (ref 26.0–34.0)
MCV: 87.3 fL (ref 78.0–100.0)
Platelets: 273 10*3/uL (ref 150–400)
RBC: 4.89 MIL/uL (ref 3.87–5.11)
WBC: 7.2 10*3/uL (ref 4.0–10.5)

## 2010-12-08 LAB — POCT I-STAT TROPONIN I
Troponin i, poc: 0.02 ng/mL (ref 0.00–0.08)
Troponin i, poc: 0 ng/mL (ref 0.00–0.08)

## 2010-12-09 ENCOUNTER — Observation Stay (HOSPITAL_COMMUNITY): Admit: 2010-12-09 | Payer: BC Managed Care – PPO

## 2010-12-09 LAB — LIPID PANEL
Cholesterol: 233 mg/dL — ABNORMAL HIGH (ref 0–200)
Total CHOL/HDL Ratio: 5.8 RATIO
Triglycerides: 167 mg/dL — ABNORMAL HIGH (ref <150)
VLDL: 33 mg/dL (ref 0–40)

## 2010-12-09 LAB — TSH: TSH: 1.557 u[IU]/mL (ref 0.350–4.500)

## 2010-12-09 LAB — CK TOTAL AND CKMB (NOT AT ARMC): Total CK: 71 U/L (ref 7–177)

## 2010-12-09 LAB — HEMOGLOBIN A1C
Hgb A1c MFr Bld: 5.7 % — ABNORMAL HIGH (ref <5.7)
Mean Plasma Glucose: 117 mg/dL — ABNORMAL HIGH (ref <117)

## 2010-12-09 LAB — TROPONIN I: Troponin I: <0.3 ng/mL (ref <0.30)

## 2010-12-09 LAB — CARDIAC PANEL(CRET KIN+CKTOT+MB+TROPI): CK, MB: 1.7 ng/mL (ref 0.3–4.0)

## 2010-12-10 ENCOUNTER — Inpatient Hospital Stay (HOSPITAL_COMMUNITY): Payer: BC Managed Care – PPO

## 2010-12-10 LAB — CARDIAC PANEL(CRET KIN+CKTOT+MB+TROPI): Total CK: 58 U/L (ref 7–177)

## 2010-12-10 MED ORDER — TECHNETIUM TC 99M TETROFOSMIN IV KIT
30.0000 | PACK | Freq: Once | INTRAVENOUS | Status: AC | PRN
  Administered 2010-12-10: 30 via INTRAVENOUS

## 2010-12-10 MED ORDER — TECHNETIUM TC 99M TETROFOSMIN IV KIT
10.0000 | PACK | Freq: Once | INTRAVENOUS | Status: AC | PRN
  Administered 2010-12-10: 10 via INTRAVENOUS

## 2010-12-11 ENCOUNTER — Encounter: Payer: Self-pay | Admitting: Family Medicine

## 2010-12-11 ENCOUNTER — Ambulatory Visit (INDEPENDENT_AMBULATORY_CARE_PROVIDER_SITE_OTHER): Payer: BC Managed Care – PPO | Admitting: Family Medicine

## 2010-12-11 VITALS — BP 116/84 | HR 76 | Temp 98.1°F | Wt 187.0 lb

## 2010-12-11 DIAGNOSIS — R079 Chest pain, unspecified: Secondary | ICD-10-CM

## 2010-12-11 NOTE — Progress Notes (Signed)
Review of recent history.  Was on HCTZ for extended period of time.  Had routine labs with elevated Ca++ and potassium, resolved with lower dose of HCTZ.  Since then she has had exertional SOB, chest pain (positional, improved but not resolved with maalox), and fatigue.  She has had mult normal calciums level and PTH wnl in meantime.  Potassium has normalized.  Also prev with benign holter with occ PVC.    Recently in hospital for chest pain.  Labs d/w pt.  Calcium and bilirubin wnl in hospital.  Likely prediabetic, but this isn't new.  A1c <6.  D dimer prev neg and ischemic w/u negative (nuclear test w/o ischemia, troponins were negative).  Also with small effusion on echo.    Still with SOB and chest pain.  Fatigued.    She has f/u with cardiology for second opinion tomorrow.  In the interval, she took some maalox with some partial relief of chest pain.  She continues to have some positional component of the chest pain.  She notes that she didn't have SOB or chest pain until after the dec in HCTZ.   She asked about getting a second opinion about the effusion.    She's had (and continues to have) fatigue and exertional SOB that has limited her ability to work.    ROS:  See HPI.  Otherwise negative.  Filed Vitals:   12/11/10 1124  BP: 116/84  Pulse: 76  Temp: 98.1 F (36.7 C)   Current Outpatient Prescriptions on File Prior to Visit  Medication Sig Dispense Refill  . atenolol (TENORMIN) 50 MG tablet Take 50 mg by mouth daily.       . Cholecalciferol (VITAMIN D) 1000 UNITS capsule Take 1,000 Units by mouth daily.        . cyanocobalamin (,VITAMIN B-12,) 1000 MCG/ML injection Inject 1,000 mcg into the muscle once a week.       . esomeprazole (NEXIUM) 40 MG capsule Take 40 mg by mouth daily before breakfast.         Also on HCTZ 12.8m a day.   Allergies  Allergen Reactions  . Barbiturates     REACTION: reaction not known  . Cefuroxime Axetil     REACTION: GI upset  . Clarithromycin      REACTION: nausea  . Oxybutynin     REACTION: fluid retention, headaches   Past Medical History  Diagnosis Date  . GERD (gastroesophageal reflux disease)   . Hyperlipidemia   . Sleep apnea   . Thyroid nodule   . RLS (restless legs syndrome)   . Compression fracture of cervical vertebra     C5-6  . Treadmill stress test negative for angina pectoris 01/2000  . Mononucleosis 2005  . NASH (nonalcoholic steatohepatitis)   . Kidney stones   . B12 deficiency     Past Surgical History  Procedure Date  . Abdominal hysterectomy   . Bladder repair 2003  . Esophagogastroduodenoscopy 03/2000    Negative  . Stress cardiolite 12/2002    Negative  . Cardiovascular stress test 07/2006    Negative, (equivocal), increased uptake of radiotracer mid back  . [place]abdominal 08/2003 & 01/2005    Fatty liver (both times)  . Appendectomy   . Cardiovascular stress test     11/2010, no ischemia  . Doppler echocardiography     small effusion, but normal o/w    History   Social History  . Marital Status: Married    Spouse Name: N/A  Number of Children: 2  . Years of Education: N/A   Occupational History  .    [employer]     Social History Main Topics  . Smoking status: Never Smoker   . Smokeless tobacco: Never Used  . Alcohol Use: No  . Drug Use: No  . Sexually Active: Not on file   Other Topics Concern  . Not on file   Social History Narrative   Married.    GEN: nad, alert and oriented HEENT: mucous membranes moist NECK: supple w/o LA CV: rrr.  no murmur, rub.  No ectopy, chest wall not ttp PULM: ctab, no inc wob ABD: soft, +bs EXT: no edema SKIN: no acute rash  Recent labs reviewed from gyn clinic.  Minimal elevation in bili at 1.4.  A1c is 6, estradiol and progesterone at expected levels.  CBC unremarkable, TSH/T4 wnl, and K/Ca++ normal.  Cr 0.85.

## 2010-12-11 NOTE — Assessment & Plan Note (Addendum)
>  45 min spent with face to face with patient, >50% counseling and/or coordinating care.  Will see Dr. Gwenlyn Found tomorrow.  I would like cards input on the importance of the cardiac effusion.  I think this is the current crux of the matter.  I don't think this is large enough to affect cardiac output and cause SOB, but it may be related to the pain she is having.  If it is causing the symptoms, I would like to know if it should be treated with NSAIDS or possibly with other agents (ie higher dose of HCTZ).  If treated with NSAIDS, she'll likely need continued treatment for GERD.  If HCTZ is increased, we'll likely have to monitor her electrolytes.    If cards feels that the effusion is not causative for her sx, then I would like to avoid inc in HCTZ and NSAID use due to the above potential for electrolyte changes and GERD exacerbation.  If cards feels that the effusion is not causative for her sx, then we'll need to investigate other causes, ie GERD, hiatal hernia.  In any event, pt continues to have SOB, CP and fatigue that limit her ability to work (ie walk distances required for work), so she'll get her work papers sent to me to fill out.    I have reviewed recent labs from Dr. Helane Rima.  I appreciate help of all involved.

## 2010-12-11 NOTE — Discharge Summary (Signed)
NAMEFAREEDA, [name]                 ACCOUNT NO.:  [id number]  MEDICAL RECORD NO.:  [id number]  LOCATION:  2001                         FACILITY:  [facility]  PHYSICIAN:  Jerline Pain, MD      DATE OF BIRTH:  11/16/1965  DATE OF ADMISSION:  12/08/2010 DATE OF DISCHARGE:  12/10/2010                              DISCHARGE SUMMARY   CARDIOLOGIST:  Fransico Him, MD  PRIMARY PHYSICIAN:  Dr. Marian Sorrow.  FINAL DIAGNOSES: 1. Chest pain. 2. Shortness of breath. 3. Fatigue. 4. Gastroesophageal reflux disease. 5. Prior premature ventricular contractions. 6. Nonalcoholic steatohepatitis or nonalcoholic fatty liver disease. 7. Urinary incontinence.  STUDIES:  Nuclear stress test performed on December 10, 2010, showed normal ejection fraction of 76% with no ischemia.  Labs demonstrated normal cardiac biomarkers.  D-dimer was normal.  Hemoglobin A1c 5.7. TSH was normal.  LDL-cholesterol was 160, triglycerides 167, HDL 40. Liver enzymes were all normal on this admission.  EKG showed sinus rhythm rate 61 with PR interval of 232 milliseconds, first-degree AV block, no ST-segment changes.  No changes on EKG throughout this admission.  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  Her pain was noticed during the hospitalization, last when lying down in bed, described as a sharp like pain somewhat of a pressure that seemed to be better when sitting up in the chair.  She normally is very active and recently has been very fatigued.  No recent fevers or chills, melena.  She has had a hysterectomy in the past, sees GI for nonalcoholic fatty liver disease. An echocardiogram was done during this admission, which showed a  very trivial pericardial effusion, but normal pumping function.  EKG showed no evidence of pericarditis.  She was afebrile throughout the admission. Chest pain did not seem to be worse with deep inspiration.  I had a lengthy discussion with her about her symptoms and she is concerned about what is causing the symptoms.  We went over her nuclear stress test as well as echocardiogram findings.  I stated that with the positional change in her chest discomfort perhaps there is a musculoskeletal component or even perhaps a pericardial inflammation component.  Empiric treatment for either with ibuprofen 600 mg three times a day for the next 7 days with meals, I have told her would be perhaps beneficial.  She is still concerned about the intermittent shortness of breath.  Some day, she has good days and some days, she has bad days.  Thus far, her workup has been unremarkable and reassuring including D-dimer.  I encouraged her to refrain from heavy caffeine use or supplement use and to try to increase her exercise slowly.  She has been tested twice for sleep apnea and they are both been negative. Obviously, she was concerned about her symptoms and concerned about her ability to perform her tasks at work.  Based upon her hospitalization, there does not appear  to be any cardiac condition at this point that would warrant discontinuation from work.  I asked her to continue to work closely with her primary physician in this regard.  DISCHARGE MEDICATIONS: 1. Ibuprofen 600 mg three times a day for 7 days. 2. Atenolol 25 mg every morning. 3. Hydrochlorothiazide 25 mg half tablet daily. 4. Nexium 40 mg every day. 5. Vitamin B12 monthly.  PRIOR MEDICATION ALLERGIES: 1. MEPERIDINE. 2. CODEINE. 3. VICODIN. 4. OXYCODONE, all of them cause nausea and vomiting.  I have encouraged her to seek her primary doctor, Dr. Damita Dunnings over the next week.  She did state that he was going to be on paternity  leave throughout September and I encouraged her to see one of his partners.  I have also made her an appointment in 1 month for follow up of chest pain symptoms with Dr. Radford Pax.  Discharge time 35 minutes spent with the patient, the patient instructions, review of medical records.     Jerline Pain, MD     MCS/MEDQ  D:  12/10/2010  T:  12/10/2010  Job:  [id number]  cc:   Fransico Him, M.D. Dr. Damita Dunnings  Electronically Signed by Candee Furbish MD on 12/11/2010 06:16:38 AM

## 2010-12-11 NOTE — Patient Instructions (Signed)
Keep the appointment with Dr. Gwenlyn Found and I'll send the notes along.  Take care.  Don't change your meds in the meantime, except for talking maalox as needed.

## 2010-12-19 ENCOUNTER — Ambulatory Visit (HOSPITAL_COMMUNITY)
Admission: RE | Admit: 2010-12-19 | Discharge: 2010-12-19 | Disposition: A | Discharge status: Home / Self Care | Payer: BC Managed Care – PPO | Source: Ambulatory Visit | Attending: Cardiovascular Disease | Admitting: Cardiovascular Disease

## 2010-12-19 DIAGNOSIS — R002 Palpitations: Secondary | ICD-10-CM | POA: Insufficient documentation

## 2010-12-19 DIAGNOSIS — R0609 Other forms of dyspnea: Secondary | ICD-10-CM | POA: Insufficient documentation

## 2010-12-19 DIAGNOSIS — R079 Chest pain, unspecified: Secondary | ICD-10-CM | POA: Insufficient documentation

## 2010-12-19 DIAGNOSIS — R0989 Other specified symptoms and signs involving the circulatory and respiratory systems: Secondary | ICD-10-CM | POA: Insufficient documentation

## 2010-12-19 LAB — GLUCOSE, CAPILLARY: Glucose-Capillary: 119 mg/dL — ABNORMAL HIGH (ref 70–99)

## 2010-12-22 ENCOUNTER — Encounter: Payer: Self-pay | Admitting: Family Medicine

## 2010-12-23 ENCOUNTER — Telehealth: Payer: Self-pay | Admitting: *Deleted

## 2010-12-23 NOTE — Telephone Encounter (Signed)
Note continued from below, disability forms have been faxed over, these are on your desk. Pt says this needs to be completed asap, since you will be going out soon.

## 2010-12-23 NOTE — Telephone Encounter (Signed)
Pt is asking if you have gotten any short term disability papers from her insurance company.  She says they have told her they have tried to fax them for a week and they are not going through.    I gave her [facility]'s fax number to give to them.

## 2010-12-24 ENCOUNTER — Ambulatory Visit: Payer: BC Managed Care – PPO | Admitting: Family Medicine

## 2010-12-24 ENCOUNTER — Encounter: Payer: Self-pay | Admitting: Family Medicine

## 2010-12-24 ENCOUNTER — Ambulatory Visit (INDEPENDENT_AMBULATORY_CARE_PROVIDER_SITE_OTHER): Payer: BC Managed Care – PPO | Admitting: Family Medicine

## 2010-12-24 DIAGNOSIS — R0602 Shortness of breath: Secondary | ICD-10-CM

## 2010-12-24 DIAGNOSIS — R079 Chest pain, unspecified: Secondary | ICD-10-CM

## 2010-12-24 NOTE — Telephone Encounter (Signed)
I'l address the hard copy, please scan and then give to pt. Thanks.

## 2010-12-24 NOTE — Progress Notes (Signed)
H/o exertional CP/SOB, h/o some SOB at rest.  Now s/p cath, which was normal.  She's back on HCTZ for 62m for 1 week, recheck with cards pending.  Nexium helped the tightness in chest.  Much improved in last week but not resolved fully.  Still tired and gets out of breath (with exertion usually), but is getting better.  CP improved. Palpitations are better now.    Lipids were up on recent check.  She has NASH- consider recheck fasting sugar after she works on weight.  We talked about this today.    Cath report and [facility] records reviewed.  Past Medical History  Diagnosis Date  . GERD (gastroesophageal reflux disease)   . Hyperlipidemia   . Sleep apnea   . Thyroid nodule   . RLS (restless legs syndrome)   . Compression fracture of cervical vertebra     C5-6  . Treadmill stress test negative for angina pectoris 01/2000  . Mononucleosis 2005  . NASH (nonalcoholic steatohepatitis)   . Kidney stones   . B12 deficiency   . Diverticulitis     04/2008   Past Surgical History  Procedure Date  . Abdominal hysterectomy   . Bladder repair 2003  . Esophagogastroduodenoscopy 03/2000    Negative  . Stress cardiolite 12/2002    Negative  . Cardiovascular stress test 07/2006    Negative, (equivocal), increased uptake of radiotracer mid back  . [place]dominal 08/2003 & 01/2005    Fatty liver (both times)  . Appendectomy   . Cardiovascular stress test     11/2010, no ischemia  . Doppler echocardiography     small effusion, but normal o/w  . Tonsillectomy   . Pubic vaginal sling     2002, 2006  . Cardiac catheterization     12/2010 with Dr. BGwenlyn Found normal   History   Social History  . Marital Status: Married    Spouse Name: N/A    Number of Children: 2  . Years of Education: N/A   Occupational History  .    .[employer]    Social History Main Topics  . Smoking status: Never Smoker   . Smokeless tobacco: Never Used  . Alcohol Use: No  . Drug Use: No  . Sexually Active: Not on file   Other  Topics Concern  . Not on file   Social History Narrative   Married.    Current Outpatient Prescriptions on File Prior to Visit  Medication Sig Dispense Refill  . atenolol (TENORMIN) 50 MG tablet Take 1-1/2 tablets by mouth each day.      . Cholecalciferol (VITAMIN D) 1000 UNITS capsule Take 1,000 Units by mouth daily.        . cyanocobalamin (,VITAMIN B-12,) 1000 MCG/ML injection Inject 1,000 mcg into the muscle every 30 (thirty) days.       .Marland Kitchenesomeprazole (NEXIUM) 40 MG capsule Take 40 mg by mouth 2 (two) times daily.       . hydrochlorothiazide (,MICROZIDE/HYDRODIURIL,) 12.5 MG capsule Take 12.5 mg by mouth daily.         Allergies  Allergen Reactions  . Barbiturates     REACTION: reaction not known  . Cefuroxime Axetil     REACTION: GI upset  . Clarithromycin     REACTION: nausea  . Oxybutynin     REACTION: fluid retention, headaches   ROS:  See HPI.  Otherwise negative.  nad ncat Mmm Neck supple w/o bruit RRR ctab abd benign Ext w/o edema

## 2010-12-24 NOTE — Patient Instructions (Signed)
We'll get your papers ready and I'll get Lugene to call you when they are ready.  I want to recheck your sugar at a fasting office visit in 1/13.  21mn visit.  Try to gradually lose weight.   Take care.

## 2010-12-25 ENCOUNTER — Encounter: Payer: Self-pay | Admitting: Family Medicine

## 2010-12-25 DIAGNOSIS — R0602 Shortness of breath: Secondary | ICD-10-CM | POA: Insufficient documentation

## 2010-12-25 NOTE — Assessment & Plan Note (Signed)
See below

## 2010-12-25 NOTE — Assessment & Plan Note (Signed)
Not related to anxiety, improved on PPI with neg cath.  She isn't fully w/o symptoms.  She has f/u with cards pending.  I would continue as is for now, and anticipate her being out of work for 1-2 more weeks (to allow symptom resolution and f/u with cards).  If resolved in 1-2 weeks, then likely return to work at that point.  D/w pt and she agreed with tentative plan.  >25 min spent with face to face with patient, >50% counseling and/or coordinating care.

## 2010-12-25 NOTE — Telephone Encounter (Signed)
Form copied for scanning.  Patient advised that paperwork is ready and placed at front desk for pick up.

## 2010-12-28 NOTE — H&P (Signed)
[name], [name]                 ACCOUNT NO.:  [id number]  MEDICAL RECORD NO.:  [id number]  LOCATION:  1899                         FACILITY:  [facility]  PHYSICIAN:  Fransico Him, M.D.     DATE OF BIRTH:  1965-10-18  DATE OF ADMISSION:  12/08/2010 DATE OF DISCHARGE:                             HISTORY & PHYSICAL   REFERRING PHYSICIAN:  Dr. Alvino Chapel.  CHIEF COMPLAINT:  Chest pain, shortness of breath.  HISTORY OF PRESENT ILLNESS:  This is a 45 year old female with history of hypertension and PVCs, who presented to the emergency room with complaints of chest pain and shortness of breath.  Her symptoms have been occurring intermittently for 2 weeks.  She describes the pain as a band-like sensation around her chest.  This discomfort is also associated with sharp small pain.  Discomfort can last up to 1 hour at a time.  It is somewhat exacerbated by lying down.  She also complains of shortness of breath with severe dyspnea on exertion to the point she cannot do her activities of daily living.  She is very active at baseline, but has noticed severe exertional fatigue.  Yesterday around 1:30 p.m., she was unloading her dishwasher and developed chest pressure and presented to the emergency room.  All cardiac enzymes thus far have been negative which includes three sets of troponins.  She denies any fever, chills, nausea, vomiting, cough, or syncope.  She is also complained of palpitations and fluttering in her chest and is currently wearing a heart monitor, but the only thing her heart monitor thus far showed up in our office PVCs.  PAST MEDICAL HISTORY:  Includes hypertension, lower extremity edema, GERD, palpitations with PVCs, nonalcoholic steatosis hepatitis syndrome, urinary incontinence.  SOCIAL HISTORY:  She is married.  She denies any tobacco or alcohol use. No IV drug use.  PAST SURGICAL HISTORY:  Total abdominal hysterectomy.  ALLERGIES:  DEMEROL and CODEINE causes  vomiting.  MEDICATIONS:  Include B12, HCTZ 25 mg one half tablet daily, atenolol 25 mg b.i.d., Nexium 40 mg daily.  REVIEW OF SYSTEMS:  Other than what was stated in the HPI is negative.  PHYSICAL EXAM:  VITAL SIGNS:  Blood pressure is 128/80, heart rate 72, respirations 18. GENERAL:  He is a well-developed, well-nourished white female in no acute distress. HEENT: Benign. NECK:  Supple without lymphadenopathy.  Carotid upstrokes are +2 bilaterally, no bruits. LUNGS:  Clear to auscultation throughout. HEART:  Regular rate and rhythm.  No murmurs, rubs or gallops. Normal S1 and S2. ABDOMEN:  Soft, nontender, nondistended.  Normoactive bowel sounds.  No hepatosplenomegaly. EXTREMITIES:  No edema.  LABORATORY DATA:  Sodium 139, potassium 3.8, chloride 102, bicarb 30, BUN 16, creatinine 0.79.  Glucose 137.  White cell count 7.2, hemoglobin 14.7, hematocrit 42.7, platelet count 213.  LFTs are normal.  Troponins are 0.02 and 0.  Full cardiac enzymes included CPK 71, MB 1.7, troponin less than 0.3.  Chest x-ray shows no active disease.  EKG shows normal sinus rhythm with no ST changes.  ASSESSMENT: 1. Chest pain with typical and atypical components associated with     shortness of breath and exertional fatigue.  Cardiac  enzymes are     negative x1.  EKG is nonischemic. 2. Hypertension, uncontrol. 3. Palpitations with premature ventricular contractions diagnosed on     monitor.  She is currently on atenolol.  An atenolol recently was     increased to b.i.d. without any improvement of palpitations. 4. Nonalcoholic steatohepatitis.  PLAN:  Admit to telemetry bed.  Cycle cardiac enzymes.  N.p.o. after midnight.  Lexiscan and Cardiolite in morning if all enzymes are negative, this scheduled as well today.  Check a 2-D echocardiogram to rule out pericardial effusion.  Continue current medications.  We will start aspirin 325 mg daily and IV heparin drip per pharmacy until rule out  complete.     Fransico Him, M.D.     TT/MEDQ  D:  12/09/2010  T:  12/09/2010  Job:  [id number]  Electronically Signed by Fransico Him M.D. on 12/28/2010 03:14:49 PM

## 2010-12-31 ENCOUNTER — Telehealth: Payer: Self-pay | Admitting: *Deleted

## 2010-12-31 NOTE — Telephone Encounter (Signed)
Pt is asking the problems she has been having could be caused by asthma.  States she is still having shortness of breath with exertion.  Appointment made to see Dr. Council Mechanic this afternoon.

## 2011-01-01 ENCOUNTER — Encounter: Payer: Self-pay | Admitting: Family Medicine

## 2011-01-01 ENCOUNTER — Ambulatory Visit (INDEPENDENT_AMBULATORY_CARE_PROVIDER_SITE_OTHER): Payer: BC Managed Care – PPO | Admitting: Family Medicine

## 2011-01-01 DIAGNOSIS — R079 Chest pain, unspecified: Secondary | ICD-10-CM

## 2011-01-01 DIAGNOSIS — R0602 Shortness of breath: Secondary | ICD-10-CM

## 2011-01-01 MED ORDER — ATENOLOL 50 MG PO TABS: ORAL_TABLET | ORAL | Status: DC

## 2011-01-01 NOTE — Assessment & Plan Note (Signed)
?  Viral cosochondritis vs pericarditis. Just had significant cardiac workup, including Echo. Will follow for now. Pt reports she had this discomfort before the cardiac workup.

## 2011-01-01 NOTE — Progress Notes (Signed)
  Subjective:    Patient ID: [name], female    DOB: June 10, 1965, 45 y.o.   MRN: [id number]  HPI Pt of Dr Josefine Class here with her husband as acute appt for SOB. She was seen by Dr Damita Dunnings recently and felt to be improving in regard to SOB with exertion and Chest Pain. She was recently seen for cardiac issues, had workup to include Echo and Cath and told has great heart. She has had no further problems with tachycardia, now on slightly increased dose of Atenolol, 46m, 1/2 in the AM, whole at night. However, walking into church on Sunday and then singing, she felt like she might pass out. She feels like she is having acute tightness which began about the first of August, described as a band around her chest that tightens. She also has a pain in a very focal area just to the right of the sternum, approx the level of T4.  She has been on Atenolol for years. She has no known asthma or other lung disorder. She does not think she wheezes but certainly has difficulty inspiring air.     Review of SystemsNoncontributory except as above.       Objective:   Physical Exam  Constitutional: She appears well-developed and well-nourished. No distress.       Comfortable and nontoxic appearing.  HENT:  Head: Normocephalic and atraumatic.  Right Ear: External ear normal.  Left Ear: External ear normal.  Nose: Nose normal.  Mouth/Throat: Oropharynx is clear and moist. No oropharyngeal exudate.  Eyes: Conjunctivae and EOM are normal. Pupils are equal, round, and reactive to light.  Neck: Normal range of motion. Neck supple. No thyromegaly present.  Cardiovascular: Normal rate, regular rhythm and normal heart sounds.        Pain with A/P pressure at the right T4-5/sternal junction.  Pulmonary/Chest: Effort normal and breath sounds normal. She has no wheezes. She has no rales. She exhibits tenderness (see above.).  Abdominal: Soft. Bowel sounds are normal. She exhibits no distension. There is no tenderness.    Lymphadenopathy:    She has no cervical adenopathy.  Skin: She is not diaphoretic.          Assessment & Plan:

## 2011-01-01 NOTE — Assessment & Plan Note (Addendum)
Will try treating as asthmatic component as clinical trial.  Trial of Advair bid regularly. Given one month of samples. Discussed and demonstrated technique. If worsens while trying this, return. COuld be early presentation of cardiomyopathy. Will refer back to cardiology if no improvement from the respiratory side.

## 2011-01-01 NOTE — Patient Instructions (Addendum)
Start Advair today, twice a day, as demonstrated. RTC one month for followup.  Return sooner if sxs worsen. 30 mins spent with pt and husband.

## 2011-01-07 ENCOUNTER — Telehealth: Payer: Self-pay | Admitting: *Deleted

## 2011-01-07 NOTE — Telephone Encounter (Signed)
Pt would like to discuss her work status with you.  She saw her cardiologist yesterday, he did labs, 24 hour urine.  She is waiting to hear back from those.  She was very sick yesterday with palpitations, exhaustion, foggy head.  She feels better today but doesn't think she's ready to go back to work.  Do you need to see her again, or discuss this over the phone.

## 2011-01-07 NOTE — Telephone Encounter (Signed)
I called and LMOVM.  I'll call her tomorrow.

## 2011-01-08 NOTE — Telephone Encounter (Signed)
I called pt.  Her Ca++ was elevated again and she is awaiting f/u labs from this week with cards.  She continues to have episodic profound exhaustion/fatigue and palpitations, "some days are better than others."  Her BP has been low on the  BB and HCTZ.  I asked her to notify cards about this.  She had palpitations on the advair, so I advised her to stop this.  It is reasonable to extend her time out of work due to the sx above. She'll let me know if she needs other forms for work.

## 2011-01-11 NOTE — Cardiovascular Report (Signed)
[name], [name] NO.:  [id number]  MEDICAL RECORD NO.:  [id number]  LOCATION:  [facility]                         FACILITY:  [facility]  PHYSICIAN:  Quay Burow, M.D.   DATE OF BIRTH:  10-Sep-1965  DATE OF PROCEDURE:  12/19/2010 DATE OF DISCHARGE:                           CARDIAC CATHETERIZATION   PROCEDURE:  Diagnostic coronary arteriogram.  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  She and her husband live in [place].  She is referred through the courtesy of Dr. Dian Queen for cardiovascular evaluation because of onset of chest heaviness associated dyspnea on exertion and palpitations.  She did have a normal 2-D echo and Myoview stress test.  Risk factors include family history with a father that had an MI in his 82s as well as moderate hyperlipidemia.  DESCRIPTION OF PROCEDURE:  The patient was brought to the second floor [facility] cardiac cath lab in the postabsorptive state.  She is premedicated with p.o. Valium, IV Versed, and fentanyl.  Her right wrist was prepped and shaved in the usual sterile fashion.  Xylocaine 1% was used for local anesthesia.  A 5-French sheath was inserted into the right radial artery using standard back-wall pullback technique.  The patient received 5000 units of heparin intravenously.  A 5 mL of radial cocktail was administered via the side-arm sheath.  A 5-French TIG-4 catheter along with pigtail catheter were used for selective coronary angiography and left ventriculography respectively.  Visipaque dye was used for  the entirety of the case.  Retrograde aortic, left ventricular, and pull-back pressures were recorded.  HEMODYNAMICS: 1. Aortic systolic pressure 88, diastolic pressure 59. 2. Left ventricular systolic pressure 93 and  end-diastolic pressure 3.     The patient was mildly hypotensive during the case and she did have     a low EDP and she was given IV fluid bolus.  SELECTIVE CORONARY ANGIOGRAPHY: 1. Left main normal. 2. LAD normal. 3. Left circumflex normal. 4. Right coronary artery was dominant and normal.  Left Ventriculography; RAO left ventriculogram was performed using 25 mL of Visipaque dye at 12 mL per second.  The overall LVEF was estimated greater than 60% without focal wall motion abnormalities.  IMPRESSION:  [name] coronary anatomy is completely normal as is her left ventricular function.  I believe her Carlton Adam was accurate and her symptoms noncardiac.  The right radial sheath was pulled and TR band was placed on the right wrist with 12 mL of air to achieve patent hemostasis.  The patient left the lab in stable condition.  She will remain in the hospital for the next 2 hours while her TR band is gradually deflated and removed.  She will be discharged home.  She will see me back in the office in 1 week for a followup.  Dr. Claudie Revering notified of these results.     Quay Burow, M.D.     Geralynn Rile  D:  12/19/2010  T:  12/19/2010  Job:  [id number]  cc:   Second Floor  [facility] Michelle L. Helane Rima, M.D. Elveria Rising Damita Dunnings, M.D. Nelda Severe Kellie Simmering, M.D.  Electronically Signed by Quay Burow M.D. on 01/11/2011 11:45:21 AM

## 2011-01-16 ENCOUNTER — Ambulatory Visit (INDEPENDENT_AMBULATORY_CARE_PROVIDER_SITE_OTHER): Payer: BC Managed Care – PPO | Admitting: Family Medicine

## 2011-01-16 ENCOUNTER — Encounter: Payer: Self-pay | Admitting: Family Medicine

## 2011-01-16 DIAGNOSIS — R0602 Shortness of breath: Secondary | ICD-10-CM

## 2011-01-16 NOTE — Progress Notes (Signed)
BP has been different laying, sitting, standing (pre-metoprolol and even with metoprolol added on).  Pulse is also different with position change.  She can get dizzy with heart racing with exertion.  Was started on metoprolol and her pulse has not been racing as much on the 55m dose.  Breathing is some better but she had to catch her breath after waking into the clinic today.  She still gets lightheaded.  The sx are bad enough to keep her out of work still.    Resting heart rate has been up to 111, even early in the day.  She notes inc in pulse with standing.   Relatives with POTTS and sister with h/o tachycardia.    Meds, vitals, and allergies reviewed.   ROS: See HPI.  Otherwise, noncontributory.  GEN: nad, alert and oriented HEENT: mucous membranes moist NECK: supple w/o LA CV: rrr with pulse ~80 sitting but pulse noticeably increased with standing.  PULM: ctab, no inc wob EXT: no edema SKIN: no acute rash

## 2011-01-16 NOTE — Patient Instructions (Signed)
Continue the 15m of toprol.  If you continue to have high pulse rates, let Dr. BGwenlyn Foundknow.  See if he thinks the EP eval would be useful.

## 2011-01-18 ENCOUNTER — Encounter: Payer: Self-pay | Admitting: Family Medicine

## 2011-01-18 NOTE — Assessment & Plan Note (Signed)
She still needs to be out of work due to [employer].  She may need a higher dose on the betablocker.  This is her first day on the 11m dose.  Goal to limit tachycardia and not induce hypotension.  She'll continue with the higher dose of metoprolol and will notify cards if not improved. She may or may not need EP eval, but I'll await further input from cards.  All d/w pt and she understood. >25 min spent with face to face with patient, >50% counseling.

## 2011-01-21 ENCOUNTER — Telehealth: Payer: Self-pay | Admitting: *Deleted

## 2011-01-21 DIAGNOSIS — R000 Tachycardia, unspecified: Secondary | ICD-10-CM

## 2011-01-21 NOTE — Telephone Encounter (Signed)
Referral ordered

## 2011-01-21 NOTE — Telephone Encounter (Signed)
Pt states her pulse is still running around 110.  She would like referral to specialist that deals with electrical impulses.  She wants to see Dr. Rayann Heman in [place], who Dr. Naida Sleight office recommends.  Dr Alvester Chou is out of the office until Tuesday, so she would like for you to get the referral process started.

## 2011-01-22 NOTE — Telephone Encounter (Signed)
Had to send an in basket message to Lorenda Hatchet his scheduler and also his nurse Janan Halter to request the consult. I will be waiting to hear back from their office  About the appt with Dr Rayann Heman.

## 2011-01-27 ENCOUNTER — Telehealth: Payer: Self-pay | Admitting: *Deleted

## 2011-01-27 NOTE — Telephone Encounter (Signed)
Noted, appreciate help of all involved.

## 2011-01-27 NOTE — Telephone Encounter (Signed)
Pt called to let you know that she saw Dr. Alvester Chou and he is to send you his note.  He is re-working her meds to see if that helps her condition.  He is also wondering if dehydration is the issue.  She cancelled her appt with Dr. Rayann Heman because Dr. Alvester Chou told her that if he doesn't fix her he would want her to see someone at [facility]- he thinks she will get more attention there.

## 2011-01-29 ENCOUNTER — Telehealth: Payer: Self-pay | Admitting: *Deleted

## 2011-01-29 NOTE — Telephone Encounter (Signed)
Pt called to report that she is going to see Dr. Alvester Chou on 10/31.  She feels that she needs to stay out of work until then.  She says she is feeling better with the changes that he has made in her meds.  [employer] is to send disability papers for you to complete, supposed to be sending them today.

## 2011-01-29 NOTE — Telephone Encounter (Signed)
Will address hard copy.

## 2011-02-06 ENCOUNTER — Institutional Professional Consult (permissible substitution): Payer: BC Managed Care – PPO | Admitting: Internal Medicine

## 2011-02-12 ENCOUNTER — Ambulatory Visit (INDEPENDENT_AMBULATORY_CARE_PROVIDER_SITE_OTHER): Payer: BC Managed Care – PPO | Admitting: Family Medicine

## 2011-02-12 ENCOUNTER — Encounter: Payer: Self-pay | Admitting: Family Medicine

## 2011-02-12 DIAGNOSIS — R0602 Shortness of breath: Secondary | ICD-10-CM

## 2011-02-12 NOTE — Patient Instructions (Signed)
See how you feel with the new dose of medicine and let me know if you have concerns.

## 2011-02-12 NOTE — Progress Notes (Signed)
Seen at uro for cystitis, improved now but had reaction to macrobid.   Tachycardia.

## 2011-02-12 NOTE — Progress Notes (Signed)
Seen at uro for cystitis, improved now but had reaction to macrobid.   Tachycardia.  Saw Dr. Gwenlyn Found yesterday.  Per pt, Dr. Gwenlyn Found would like for pt to be seen at [facility] if she didn't improve. She's now on toprol xl 38m po bid.  She still notes a difference in sx/pulse between sitting and standing but she's improved.  She'd like to get back to work and per pt Dr. BGwenlyn Foundwas okay with this.  She's not planning on flying out of town in the near future.  She has f/u with cards in about 2 weeks. She still has some fatigue (but much better than prev) and pulse had been up to 120s last week.  No syncope. No presyncope no (she did have this prev).  As her BB was increased, the sx decreased as did the difference between standing and sitting pulse.  Meds, vitals, and allergies reviewed.   ROS: See HPI.  Otherwise, noncontributory.  nad ncat Mmm rrr ctab Ext w/o edema

## 2011-02-13 ENCOUNTER — Encounter: Payer: Self-pay | Admitting: Family Medicine

## 2011-02-13 NOTE — Assessment & Plan Note (Signed)
Likely related to tachycardia. Improved. Continue current meds, f/u prn here for this, keep f/u with cards.  Return to work next week after she's had time to adjust to new BB dose.

## 2011-02-16 ENCOUNTER — Telehealth: Payer: Self-pay | Admitting: *Deleted

## 2011-02-16 NOTE — Telephone Encounter (Signed)
Noted  

## 2011-02-16 NOTE — Telephone Encounter (Signed)
Pt just wanted you to know that she has not yet gone back to work.  Her heart rate has been going up while she does normal activities and Dr. Gwenlyn Found is following this.  She doesn't want to go back to soon, and then have to stay out again and start all over.  She will keep you updated, since you are the one who is completing her forms.

## 2011-02-16 NOTE — Telephone Encounter (Signed)
Noted, please get pt to request Dr. Kennon Holter notes sent here.  I'll complete another work note for her when needed.  Thanks.

## 2011-02-16 NOTE — Telephone Encounter (Signed)
Records requested

## 2011-02-19 ENCOUNTER — Telehealth: Payer: Self-pay | Admitting: *Deleted

## 2011-02-19 NOTE — Telephone Encounter (Signed)
I'll await the forms and an update from patient.  Hold off on scheduling her for now.  Thanks.

## 2011-02-19 NOTE — Telephone Encounter (Signed)
Note continued from below.  Pt called back, her insurance has suggested that she go back to work on a 30 day trial period.  Her employer will have to agree to this first.  Her insurance company will be faxing a form for you to complete.  Pt is asking that you hold off on completing this form until she hears back from her employer and advises [place].  Pt says she will be glad to come in to discuss this with you if needed.

## 2011-02-19 NOTE — Telephone Encounter (Signed)
Pt called with update-  Dr Gwenlyn Found is referring her to doctor at [facility], but her appt is 3 weeks away.  She wants to go back to work on Monday, if you are agreeable.  She says that if she has a set back then she will start the process all over again. Also, she wants you to know that she saw her dermatologist today for the rash that she has.

## 2011-02-19 NOTE — Telephone Encounter (Signed)
Patient notified as instructed by telephone. Pt will call back with further info after she talks with her employer.

## 2011-02-20 NOTE — Telephone Encounter (Signed)
Patient advised as instructed via telephone.  She said thank you.

## 2011-02-20 NOTE — Telephone Encounter (Signed)
I have the papers and I'll fill them out, get them sent in.  Plan to go back to work on Monday.

## 2011-02-20 NOTE — Telephone Encounter (Signed)
Pt called back, states her employer has agreed for her to come back for a 30 day trial.  If she has problems within that time she can go back out on the same claim.  [employer] was supposed to have faxed paperwork yesterday.  If they did, she needs this filled out and faxed back in so that she can go back to work on Monday.  Please let me know if you havent seen this and I will let her know.

## 2011-02-22 NOTE — Telephone Encounter (Signed)
I'll address the hard copy.

## 2011-02-24 ENCOUNTER — Telehealth: Payer: Self-pay | Admitting: *Deleted

## 2011-02-24 NOTE — Telephone Encounter (Signed)
Pt is asking if the date on her work note from 11/1 can be changed to state that she can go back to work on 11/12.  She has gone back, and her employer needs a note.  She said the note is very nice and the only thing she wants changed is the date.  She wants this faxed to [facility], attn Gennette Pac, fax number [phone].

## 2011-02-25 NOTE — Telephone Encounter (Signed)
Please fax the printed note. I signed the hard copy and it's in my office.  Thanks.

## 2011-02-25 NOTE — Telephone Encounter (Signed)
Faxed and sent for scanning.

## 2011-04-08 ENCOUNTER — Encounter: Payer: Self-pay | Admitting: Family Medicine

## 2011-04-08 DIAGNOSIS — R000 Tachycardia, unspecified: Secondary | ICD-10-CM

## 2011-04-08 DIAGNOSIS — I951 Orthostatic hypotension: Secondary | ICD-10-CM

## 2011-04-08 DIAGNOSIS — I498 Other specified cardiac arrhythmias: Secondary | ICD-10-CM | POA: Insufficient documentation

## 2011-04-08 DIAGNOSIS — G90A Postural orthostatic tachycardia syndrome (POTS): Secondary | ICD-10-CM | POA: Insufficient documentation

## 2011-08-28 ENCOUNTER — Other Ambulatory Visit: Payer: Self-pay | Admitting: Obstetrics and Gynecology

## 2011-11-12 ENCOUNTER — Ambulatory Visit: Payer: BC Managed Care – PPO

## 2011-12-10 ENCOUNTER — Encounter: Payer: Self-pay | Admitting: *Deleted

## 2011-12-10 ENCOUNTER — Encounter: Payer: BC Managed Care – PPO | Attending: Endocrinology | Admitting: *Deleted

## 2011-12-10 VITALS — Ht 64.5 in | Wt 187.3 lb

## 2011-12-10 DIAGNOSIS — E119 Type 2 diabetes mellitus without complications: Secondary | ICD-10-CM | POA: Insufficient documentation

## 2011-12-10 DIAGNOSIS — Z713 Dietary counseling and surveillance: Secondary | ICD-10-CM | POA: Insufficient documentation

## 2011-12-10 NOTE — Progress Notes (Signed)
Hg A1C 6.1%

## 2011-12-10 NOTE — Patient Instructions (Signed)
Goals:  Follow Diabetes Meal Plan as instructed  Eat 3 meals and 2 snacks, every 3-5 hrs  Limit carbohydrate intake to 30-45 grams carbohydrate/meal  Limit carbohydrate intake to 15 grams carbohydrate/snack  Add lean protein foods to meals/snacks  Monitor glucose levels as instructed by your doctor  Aim for 30 mins of physical activity daily  Bring food record and glucose log to your next nutrition visit 

## 2011-12-10 NOTE — Progress Notes (Signed)
  Patient was seen on 12/10/11 for the first of a series of three diabetes self-management courses at the [facility]. The following learning objectives were met by the patient during this course:   Defines the role of glucose and insulin  Identifies type of diabetes and pathophysiology  Defines the diagnostic criteria for diabetes and prediabetes  States the risk factors for Type 2 Diabetes  States the symptoms of Type 2 Diabetes  Defines Type 2 Diabetes treatment goals  Defines Type 2 Diabetes treatment options  States the rationale for glucose monitoring  Identifies A1C, glucose targets, and testing times  Identifies proper sharps disposal  Defines the purpose of a diabetes food plan  Identifies carbohydrate food groups  Defines effects of carbohydrate foods on glucose levels  Identifies carbohydrate choices/grams/food labels  States benefits of physical activity and effect on glucose  Review of suggested activity guidelines  Handouts given during class include:  Type 2 Diabetes: Basics Book  [employer] [place] and Activity Log   Follow-Up Plan: Attend core 2 and 3

## 2011-12-15 ENCOUNTER — Encounter: Payer: BC Managed Care – PPO | Attending: Endocrinology | Admitting: *Deleted

## 2011-12-15 DIAGNOSIS — E119 Type 2 diabetes mellitus without complications: Secondary | ICD-10-CM

## 2011-12-15 DIAGNOSIS — Z713 Dietary counseling and surveillance: Secondary | ICD-10-CM | POA: Insufficient documentation

## 2011-12-16 ENCOUNTER — Encounter: Payer: BC Managed Care – PPO | Admitting: Dietician

## 2011-12-16 ENCOUNTER — Encounter: Payer: Self-pay | Admitting: *Deleted

## 2011-12-16 DIAGNOSIS — E119 Type 2 diabetes mellitus without complications: Secondary | ICD-10-CM

## 2011-12-16 NOTE — Patient Instructions (Signed)
Goals:  Follow Diabetes Meal Plan as instructed  Eat 3 meals and 2 snacks, every 3-5 hrs  Limit carbohydrate intake to 30-45 grams carbohydrate/meal  Limit carbohydrate intake to 15 grams carbohydrate/snack  Add lean protein foods to meals/snacks  Monitor glucose levels as instructed by your doctor  Aim for 30 mins of physical activity daily  Bring food record and glucose log to your next nutrition visit 

## 2011-12-16 NOTE — Progress Notes (Signed)
  Patient was seen on 12/15/11 for the second of a series of three diabetes self-management courses at the [facility]. The following learning objectives were met by the patient during this course:   Explain basic nutrition maintenance and quality assurance  Describe causes, symptoms and treatment of hypoglycemia and hyperglycemia  Explain how to manage diabetes during illness  Describe the importance of good nutrition for health and healthy eating strategies  List strategies to follow meal plan when dining out  Describe the effects of alcohol on glucose and how to use it safely  Describe problem solving skills for day-to-day glucose challenges  Describe strategies to use when treatment plan needs to change  Identify important factors involved in successful weight loss  Describe ways to remain physically active  Describe the impact of regular activity on insulin resistance   Handouts given in class:  Refrigerator magnet for Sick Day Guidelines  [facility] Oral medication/insulin handout  Follow-Up Plan: Patient will attend the final class of the ADA Diabetes Self-Care Education.

## 2011-12-17 NOTE — Progress Notes (Signed)
  Patient was seen on 12/16/2011 for the third of a series of three diabetes self-management courses at the [facility]. The following learning objectives were met by the patient during this course:    Describe how diabetes changes over time   Identify diabetes complications and ways to prevent them   Describe strategies that can promote heart health including lowering blood pressure and cholesterol   Describe strategies to lower dietary fat and sodium in the diet   Identify physical activities that benefit cardiovascular health   Evaluate success in meeting personal goal   Describe the belief that they can live successfully with diabetes day to day   Establish 2-3 goals that they will plan to diligently work on until they return for the free 50-monthfollow-up visit  The following handouts were given in class:  3 Month Follow Up Visit handout  Goal setting handout  Class evaluation form  Your patient has established the following 3 month goals for diabetes self-care:  Count carbs at most of my meals and snacks.  Be active 15 minutes or more 5 times per week.  To help manage my stress, I will take off work at least one day a week.  Test my glucose at least 3 times a day.   Follow-Up Plan: Patient will attend a 3 month follow-up visit for diabetes self-management education.

## 2012-03-16 ENCOUNTER — Ambulatory Visit: Payer: BC Managed Care – PPO | Admitting: Dietician

## 2012-03-16 ENCOUNTER — Encounter: Payer: Self-pay | Admitting: Family Medicine

## 2012-03-16 ENCOUNTER — Ambulatory Visit (INDEPENDENT_AMBULATORY_CARE_PROVIDER_SITE_OTHER): Payer: BC Managed Care – PPO | Admitting: Family Medicine

## 2012-03-16 VITALS — BP 110/74 | HR 64 | Temp 98.9°F | Wt 195.5 lb

## 2012-03-16 DIAGNOSIS — M549 Dorsalgia, unspecified: Secondary | ICD-10-CM

## 2012-03-16 DIAGNOSIS — R202 Paresthesia of skin: Secondary | ICD-10-CM

## 2012-03-16 DIAGNOSIS — M545 Low back pain, unspecified: Secondary | ICD-10-CM | POA: Insufficient documentation

## 2012-03-16 DIAGNOSIS — R209 Unspecified disturbances of skin sensation: Secondary | ICD-10-CM

## 2012-03-16 NOTE — Patient Instructions (Addendum)
Take aleve with food.  See Rosaria Ferries about your referral before you leave today. Take care.

## 2012-03-16 NOTE — Progress Notes (Signed)
Episodic pain since May.  2 aleve bid, some relief but not 100% relief.  L sided, rare R sided lower back pain.  Has been going to chiropractor.  Pain occ radiating down L leg and now with tingling in the bottom of the L foot mult times.  No weakness, no trauma.  Worse if prolonged sitting.    Meds, vitals, and allergies reviewed.   ROS: See HPI.  Otherwise, noncontributory.  nad ncat rrr ctab Back w/o midline pain but ttp in L>R paraspinal L spine muscles SLR neg Distally w/o true weakness but resisted L hip flexion limited by pain DTR wnl BLE

## 2012-03-16 NOTE — Assessment & Plan Note (Addendum)
With tingling in L foot and recurrent pain, ongoing for months.  Will arrange MRI, continue aleve in meantime and will await MRI.  She agrees.   Addendum- see MRI, refer to neurosurgery.

## 2012-03-20 ENCOUNTER — Ambulatory Visit
Admission: RE | Admit: 2012-03-20 | Discharge: 2012-03-20 | Disposition: A | Discharge status: Home / Self Care | Payer: BC Managed Care – PPO | Source: Ambulatory Visit | Attending: Family Medicine | Admitting: Family Medicine

## 2012-03-20 DIAGNOSIS — R202 Paresthesia of skin: Secondary | ICD-10-CM

## 2012-03-20 DIAGNOSIS — M549 Dorsalgia, unspecified: Secondary | ICD-10-CM

## 2012-03-21 NOTE — Addendum Note (Signed)
Addended by: Tonia Ghent on: 03/21/2012 09:05 AM   Modules accepted: Orders

## 2012-03-30 ENCOUNTER — Other Ambulatory Visit: Payer: Self-pay | Admitting: Neurosurgery

## 2012-04-08 ENCOUNTER — Encounter (HOSPITAL_COMMUNITY): Payer: Self-pay | Admitting: Respiratory Therapy

## 2012-04-15 ENCOUNTER — Encounter (HOSPITAL_COMMUNITY)
Admission: RE | Admit: 2012-04-15 | Discharge: 2012-04-15 | Payer: BC Managed Care – PPO | Source: Ambulatory Visit | Attending: Neurosurgery | Admitting: Neurosurgery

## 2012-04-15 NOTE — Pre-Procedure Instructions (Signed)
[place]  04/15/2012   Your procedure is scheduled on:  January 8  Report to [facility] at 06:30 AM.  Call this number if you have problems the morning of surgery: [phone]   Remember:   Do not eat or drink:After Midnight.  Take these medicines the morning of surgery with A SIP OF WATER: Propranolol   STOP Aleve and Multiple vitamins today  Do not wear jewelry, make-up or nail polish.  Do not wear lotions, powders, or perfumes. You may wear deodorant.  Do not shave 48 hours prior to surgery. Men may shave face and neck.  Do not bring valuables to the hospital.  Contacts, dentures or bridgework may not be worn into surgery.  Leave suitcase in the car. After surgery it may be brought to your room.  For patients admitted to the hospital, checkout time is 11:00 AM the day of discharge.   Special Instructions: Shower using CHG 2 nights before surgery and the night before surgery.  If you shower the day of surgery use CHG.  Use special wash - you have one bottle of CHG for all showers.  You should use approximately 1/3 of the bottle for each shower.   Please read over the following fact sheets that you were given: Pain Booklet, Coughing and Deep Breathing and Surgical Site Infection Prevention

## 2012-04-18 ENCOUNTER — Ambulatory Visit (HOSPITAL_COMMUNITY)
Admission: RE | Admit: 2012-04-18 | Discharge: 2012-04-18 | Disposition: A | Discharge status: Home / Self Care | Payer: BC Managed Care – PPO | Source: Ambulatory Visit | Attending: Neurosurgery | Admitting: Neurosurgery

## 2012-04-18 ENCOUNTER — Encounter (HOSPITAL_COMMUNITY)
Admission: RE | Admit: 2012-04-18 | Discharge: 2012-04-18 | Disposition: A | Discharge status: Home / Self Care | Payer: BC Managed Care – PPO | Source: Ambulatory Visit | Attending: Neurosurgery | Admitting: Neurosurgery

## 2012-04-18 ENCOUNTER — Encounter (HOSPITAL_COMMUNITY): Payer: Self-pay

## 2012-04-18 DIAGNOSIS — Z01812 Encounter for preprocedural laboratory examination: Secondary | ICD-10-CM | POA: Insufficient documentation

## 2012-04-18 DIAGNOSIS — E119 Type 2 diabetes mellitus without complications: Secondary | ICD-10-CM | POA: Insufficient documentation

## 2012-04-18 DIAGNOSIS — I10 Essential (primary) hypertension: Secondary | ICD-10-CM | POA: Insufficient documentation

## 2012-04-18 DIAGNOSIS — Z01810 Encounter for preprocedural cardiovascular examination: Secondary | ICD-10-CM | POA: Insufficient documentation

## 2012-04-18 DIAGNOSIS — Z01818 Encounter for other preprocedural examination: Secondary | ICD-10-CM | POA: Insufficient documentation

## 2012-04-18 HISTORY — DX: Nausea with vomiting, unspecified: R11.2

## 2012-04-18 HISTORY — DX: Myoneural disorder, unspecified: G70.9

## 2012-04-18 HISTORY — DX: Thyrotoxicosis, unspecified without thyrotoxic crisis or storm: E05.90

## 2012-04-18 HISTORY — DX: Cardiac arrhythmia, unspecified: I49.9

## 2012-04-18 HISTORY — DX: Essential (primary) hypertension: I10

## 2012-04-18 HISTORY — DX: Pneumonia, unspecified organism: J18.9

## 2012-04-18 HISTORY — DX: Other specified postprocedural states: Z98.890

## 2012-04-18 LAB — COMPREHENSIVE METABOLIC PANEL
Alkaline Phosphatase: 38 U/L — ABNORMAL LOW (ref 39–117)
Chloride: 100 mEq/L (ref 96–112)
Creatinine, Ser: 0.75 mg/dL (ref 0.50–1.10)
GFR calc Af Amer: >90 mL/min (ref >90)
GFR calc non Af Amer: >90 mL/min (ref >90)
Potassium: 4.1 mEq/L (ref 3.5–5.1)
Sodium: 137 mEq/L (ref 135–145)
Total Protein: 7.8 g/dL (ref 6.0–8.3)

## 2012-04-18 LAB — CBC
HCT: 45.6 % (ref 36.0–46.0)
Hemoglobin: 15.6 g/dL — ABNORMAL HIGH (ref 12.0–15.0)
MCHC: 34.2 g/dL (ref 30.0–36.0)
RBC: 5.21 MIL/uL — ABNORMAL HIGH (ref 3.87–5.11)
WBC: 8 10*3/uL (ref 4.0–10.5)

## 2012-04-18 LAB — SURGICAL PCR SCREEN
MRSA, PCR: NEGATIVE
Staphylococcus aureus: NEGATIVE

## 2012-04-18 NOTE — Progress Notes (Signed)
PATIENT SEES DR BERRY - CARDIOLOGIST. DR Lurene Shadow AT [facility] FOR "POTTS" PATIENT STATES SHE HAS HAD 2 SLEEP STUDIES, FIRST SHOWED MILD SLEEP APNEA.  LAST SLEEP STUDY DONE 2-3 YEARS AGO AT [facility] WAS NEGATIVE PER PATIENT.

## 2012-04-18 NOTE — Progress Notes (Signed)
REQUESTED SLEEP STUDY DONE AT [facility]  2-3 YEARS AGO , PATIENT STATED WAS NEGATIVE

## 2012-04-18 NOTE — Pre-Procedure Instructions (Signed)
[place]  04/18/2012   Your procedure is scheduled on: Wednesday  04/20/12   Report to [facility] at 630 AM.  Call this number if you have problems the morning of surgery: [phone]   Remember:   Do not eat food OR DRINK :After Midnight.   Take these medicines the morning of surgery with A SIP OF WATER: INDERAL (PROPANOLOL) (STOP ASPIRIN, COUMADIN, PLAVIX, EFFIENT, HERBAL MEDICINES)   Do not wear jewelry, make-up or nail polish.  Do not wear lotions, powders, or perfumes. You may wear deodorant.  Do not shave 48 hours prior to surgery. Men may shave face and neck.  Do not bring valuables to the hospital.  Contacts, dentures or bridgework may not be worn into surgery.  Leave suitcase in the car. After surgery it may be brought to your room.  For patients admitted to the hospital, checkout time is 11:00 AM the day of discharge.   Patients discharged the day of surgery will not be allowed to drive home.  Name and phone number of your driver:  Special Instructions: Shower using CHG 2 nights before surgery and the night before surgery.  If you shower the day of surgery use CHG.  Use special wash - you have one bottle of CHG for all showers.  You should use approximately 1/3 of the bottle for each shower.   Please read over the following fact sheets that you were given: Pain Booklet, Coughing and Deep Breathing, MRSA Information and Surgical Site Infection Prevention

## 2012-04-19 ENCOUNTER — Encounter (HOSPITAL_COMMUNITY): Payer: Self-pay | Admitting: Vascular Surgery

## 2012-04-19 MED ORDER — VANCOMYCIN HCL IN DEXTROSE 1-5 GM/200ML-% IV SOLN
1000.0000 mg | INTRAVENOUS | Status: AC
  Administered 2012-04-20: 1000 mg via INTRAVENOUS
  Filled 2012-04-19: qty 200

## 2012-04-19 NOTE — Consult Note (Signed)
Anesthesia chart review: Patient is a 47 year old female scheduled for left L5-S1 discectomy by Dr. Christella Noa on 04/20/2012. History includes nonsmoker, postoperative nausea vomiting, nonalcoholic steatohepatitis, hyperthyroidism (with normal TSH 12/09/10), GERD, hyperlipidemia, diverticulitis, pneumonia, palpitations (POTS: postural orthostatic tachycardia syndrome), RLS, history of compression fracture of C5-6 appendectomy, hysterectomy, cholecystectomy, bladder repair, tonsillectomy.  PCP is listed as Dr. Elsie Stain.  She has been evaluated by both Dr. Fransico Him [facility]) and Dr. Gwenlyn Found, with last cardiology visit with Dr. Gwenlyn Found [facility]).  Reportedly, she also saw Dr. Clydene Laming at [facility] for POTS.  (Records requested, but are still pending.)  EKG on 04/18/12 showed SR with first degree AVB, LAD, low voltage QRS, cannot rule out anterior infarct (age undetermined).  When compared to an EKG from 11/06/10 from [facility], the left axis deviation is new, otherwise I don't think it is significantly changed.  Cardiac cath on 12/19/10 showed normal coronaries, overall LVEF was estimated > 60% without focal wall motion abnormalities.  Nuclear stress test on 12/10/10 showed no evidence of ischemia or infarction, EF 76%.  Echo on 12/09/10 showed normal LV systolic function, EF 42-35%, normal wall motion, no regional wall abnormalities.  Trivial , free flowing residual pericardial effusion posterior to the heart.  No evidence of hemodynamic compromise.  Mild pulmonic regurgitation.  Holter monitor from 11/17/10-12/16/10 [facility]) showed SR, ST, PACs, intermittent first degree AVB.  CXR on 04/18/12 showed no acute cardiopulmonary disease.  Preoperative labs noted.  If no significant change in her status then anticipate she can proceed as planned.  Myra Gianotti, PA-C 04/19/12 1129

## 2012-04-20 ENCOUNTER — Ambulatory Visit (HOSPITAL_COMMUNITY): Payer: BC Managed Care – PPO

## 2012-04-20 ENCOUNTER — Ambulatory Visit (HOSPITAL_COMMUNITY)
Admission: RE | Admit: 2012-04-20 | Discharge: 2012-04-21 | Disposition: A | Discharge status: Home / Self Care | Payer: BC Managed Care – PPO | Source: Ambulatory Visit | Attending: Neurosurgery | Admitting: Neurosurgery

## 2012-04-20 ENCOUNTER — Encounter (HOSPITAL_COMMUNITY): Payer: Self-pay | Admitting: Vascular Surgery

## 2012-04-20 ENCOUNTER — Ambulatory Visit (HOSPITAL_COMMUNITY): Payer: BC Managed Care – PPO | Admitting: Vascular Surgery

## 2012-04-20 ENCOUNTER — Encounter (HOSPITAL_COMMUNITY)
Admission: RE | Disposition: A | Discharge status: Home / Self Care | Payer: Self-pay | Source: Ambulatory Visit | Attending: Neurosurgery

## 2012-04-20 DIAGNOSIS — M5126 Other intervertebral disc displacement, lumbar region: Secondary | ICD-10-CM | POA: Insufficient documentation

## 2012-04-20 DIAGNOSIS — E119 Type 2 diabetes mellitus without complications: Secondary | ICD-10-CM | POA: Insufficient documentation

## 2012-04-20 DIAGNOSIS — I10 Essential (primary) hypertension: Secondary | ICD-10-CM | POA: Insufficient documentation

## 2012-04-20 HISTORY — PX: LUMBAR LAMINECTOMY/DECOMPRESSION MICRODISCECTOMY: SHX5026

## 2012-04-20 LAB — GLUCOSE, CAPILLARY
Glucose-Capillary: 118 mg/dL — ABNORMAL HIGH (ref 70–99)
Glucose-Capillary: 149 mg/dL — ABNORMAL HIGH (ref 70–99)

## 2012-04-20 SURGERY — LUMBAR LAMINECTOMY/DECOMPRESSION MICRODISCECTOMY 1 LEVEL
Anesthesia: General | Site: Spine Lumbar | Laterality: Left | Wound class: Clean

## 2012-04-20 MED ORDER — ONDANSETRON HCL 4 MG/2ML IJ SOLN: 4.0000 mg | Freq: Once | INTRAMUSCULAR | Status: DC | PRN

## 2012-04-20 MED ORDER — GLYCOPYRROLATE 0.2 MG/ML IJ SOLN
INTRAMUSCULAR | Status: DC | PRN
  Administered 2012-04-20: 0.4 mg via INTRAVENOUS

## 2012-04-20 MED ORDER — LACTATED RINGERS IV SOLN
INTRAVENOUS | Status: DC | PRN
  Administered 2012-04-20 (×2): via INTRAVENOUS

## 2012-04-20 MED ORDER — MENTHOL 3 MG MT LOZG: 1.0000 | LOZENGE | OROMUCOSAL | Status: DC | PRN

## 2012-04-20 MED ORDER — PROPOFOL 10 MG/ML IV BOLUS
INTRAVENOUS | Status: DC | PRN
  Administered 2012-04-20: 300 mg via INTRAVENOUS

## 2012-04-20 MED ORDER — LIDOCAINE-EPINEPHRINE 0.5 %-1:200000 IJ SOLN
INTRAMUSCULAR | Status: DC | PRN
  Administered 2012-04-20: 10 mL

## 2012-04-20 MED ORDER — CYANOCOBALAMIN 1000 MCG/ML IJ SOLN
1000.0000 ug | INTRAMUSCULAR | Status: DC
  Administered 2012-04-20: 1000 ug via INTRAMUSCULAR
  Filled 2012-04-20: qty 1

## 2012-04-20 MED ORDER — DEXAMETHASONE SODIUM PHOSPHATE 4 MG/ML IJ SOLN
INTRAMUSCULAR | Status: DC | PRN
  Administered 2012-04-20: 8 mg via INTRAVENOUS

## 2012-04-20 MED ORDER — ONE-DAILY MULTI VITAMINS PO TABS: 1.0000 | ORAL_TABLET | Freq: Every day | ORAL | Status: DC

## 2012-04-20 MED ORDER — HYDROMORPHONE HCL PF 1 MG/ML IJ SOLN
INTRAMUSCULAR | Status: AC
  Filled 2012-04-20: qty 1

## 2012-04-20 MED ORDER — PROPRANOLOL HCL 40 MG PO TABS
40.0000 mg | ORAL_TABLET | Freq: Two times a day (BID) | ORAL | Status: DC
  Administered 2012-04-20: 40 mg via ORAL
  Filled 2012-04-20 (×3): qty 1

## 2012-04-20 MED ORDER — 0.9 % SODIUM CHLORIDE (POUR BTL) OPTIME
TOPICAL | Status: DC | PRN
  Administered 2012-04-20: 1000 mL

## 2012-04-20 MED ORDER — FENTANYL CITRATE 0.05 MG/ML IJ SOLN
INTRAMUSCULAR | Status: DC | PRN
  Administered 2012-04-20 (×2): 50 ug via INTRAVENOUS

## 2012-04-20 MED ORDER — EPHEDRINE SULFATE 50 MG/ML IJ SOLN
INTRAMUSCULAR | Status: DC | PRN
  Administered 2012-04-20: 10 mg via INTRAVENOUS

## 2012-04-20 MED ORDER — HYDROMORPHONE HCL PF 1 MG/ML IJ SOLN
0.2500 mg | INTRAMUSCULAR | Status: DC | PRN
  Administered 2012-04-20 (×4): 0.5 mg via INTRAVENOUS

## 2012-04-20 MED ORDER — ADULT MULTIVITAMIN W/MINERALS CH
1.0000 | ORAL_TABLET | Freq: Every day | ORAL | Status: DC
Start: 2012-04-21 — End: 2012-04-21
  Filled 2012-04-20: qty 1

## 2012-04-20 MED ORDER — BUPIVACAINE LIPOSOME 1.3 % IJ SUSP
20.0000 mL | INTRAMUSCULAR | Status: AC
  Administered 2012-04-20: 20 mL
  Filled 2012-04-20: qty 20

## 2012-04-20 MED ORDER — MORPHINE SULFATE 2 MG/ML IJ SOLN
1.0000 mg | INTRAMUSCULAR | Status: DC | PRN
  Filled 2012-04-20: qty 1

## 2012-04-20 MED ORDER — NEOSTIGMINE METHYLSULFATE 1 MG/ML IJ SOLN
INTRAMUSCULAR | Status: DC | PRN
  Administered 2012-04-20: 3 mg via INTRAVENOUS

## 2012-04-20 MED ORDER — PHENOL 1.4 % MT LIQD: 1.0000 | OROMUCOSAL | Status: DC | PRN

## 2012-04-20 MED ORDER — HYDROMORPHONE HCL PF 1 MG/ML IJ SOLN
INTRAMUSCULAR | Status: AC
  Administered 2012-04-20: 0.5 mg via INTRAVENOUS
  Filled 2012-04-20: qty 1

## 2012-04-20 MED ORDER — THROMBIN 5000 UNITS EX KIT
PACK | CUTANEOUS | Status: DC | PRN
  Administered 2012-04-20 (×2): 5000 [IU] via TOPICAL

## 2012-04-20 MED ORDER — LIDOCAINE HCL (CARDIAC) 20 MG/ML IV SOLN
INTRAVENOUS | Status: DC | PRN
  Administered 2012-04-20: 80 mg via INTRAVENOUS

## 2012-04-20 MED ORDER — ROCURONIUM BROMIDE 100 MG/10ML IV SOLN
INTRAVENOUS | Status: DC | PRN
  Administered 2012-04-20: 50 mg via INTRAVENOUS

## 2012-04-20 MED ORDER — CYCLOBENZAPRINE HCL 10 MG PO TABS: 10.0000 mg | ORAL_TABLET | Freq: Three times a day (TID) | ORAL | Status: DC | PRN

## 2012-04-20 MED ORDER — ONDANSETRON HCL 4 MG/2ML IJ SOLN: 4.0000 mg | INTRAMUSCULAR | Status: DC | PRN

## 2012-04-20 MED ORDER — ONDANSETRON HCL 4 MG/2ML IJ SOLN
INTRAMUSCULAR | Status: DC | PRN
  Administered 2012-04-20 (×2): 4 mg via INTRAVENOUS

## 2012-04-20 MED ORDER — SODIUM CHLORIDE 0.9 % IJ SOLN: 3.0000 mL | INTRAMUSCULAR | Status: DC | PRN

## 2012-04-20 MED ORDER — PROCHLORPERAZINE 25 MG RE SUPP
25.0000 mg | Freq: Two times a day (BID) | RECTAL | Status: DC | PRN
  Administered 2012-04-20: 25 mg via RECTAL
  Filled 2012-04-20 (×2): qty 1

## 2012-04-20 MED ORDER — OXYCODONE HCL 5 MG PO TABS: 5.0000 mg | ORAL_TABLET | ORAL | Status: DC | PRN

## 2012-04-20 MED ORDER — KETOROLAC TROMETHAMINE 30 MG/ML IJ SOLN
30.0000 mg | Freq: Four times a day (QID) | INTRAMUSCULAR | Status: DC
  Administered 2012-04-20 – 2012-04-21 (×2): 30 mg via INTRAVENOUS
  Filled 2012-04-20 (×6): qty 1

## 2012-04-20 MED ORDER — SODIUM CHLORIDE 0.9 % IJ SOLN
3.0000 mL | Freq: Two times a day (BID) | INTRAMUSCULAR | Status: DC
  Administered 2012-04-20: 3 mL via INTRAVENOUS

## 2012-04-20 MED ORDER — ACETAMINOPHEN 10 MG/ML IV SOLN
1000.0000 mg | Freq: Four times a day (QID) | INTRAVENOUS | Status: DC
  Administered 2012-04-20 – 2012-04-21 (×3): 1000 mg via INTRAVENOUS
  Filled 2012-04-20 (×4): qty 100

## 2012-04-20 MED ORDER — POTASSIUM CHLORIDE IN NACL 20-0.9 MEQ/L-% IV SOLN
INTRAVENOUS | Status: DC
  Filled 2012-04-20 (×3): qty 1000

## 2012-04-20 MED ORDER — ONDANSETRON HCL 4 MG/2ML IJ SOLN
INTRAMUSCULAR | Status: AC
  Administered 2012-04-20: 4 mg
  Filled 2012-04-20: qty 2

## 2012-04-20 MED ORDER — HEMOSTATIC AGENTS (NO CHARGE) OPTIME
TOPICAL | Status: DC | PRN
  Administered 2012-04-20: 1 via TOPICAL

## 2012-04-20 SURGICAL SUPPLY — 60 items
ADH SKN CLS APL DERMABOND .7 (GAUZE/BANDAGES/DRESSINGS)
ADH SKN CLS LQ APL DERMABOND (GAUZE/BANDAGES/DRESSINGS) ×1
APL SKNCLS STERI-STRIP NONHPOA (GAUZE/BANDAGES/DRESSINGS)
BAG DECANTER FOR FLEXI CONT (MISCELLANEOUS) ×2 IMPLANT
BENZOIN TINCTURE PRP APPL 2/3 (GAUZE/BANDAGES/DRESSINGS) IMPLANT
BLADE SURG ROTATE 9660 (MISCELLANEOUS) IMPLANT
BUR MATCHSTICK NEURO 3.0 LAGG (BURR) ×2 IMPLANT
CANISTER SUCTION 2500CC (MISCELLANEOUS) ×2 IMPLANT
CLOTH BEACON ORANGE TIMEOUT ST (SAFETY) ×2 IMPLANT
CONT SPEC 4OZ CLIKSEAL STRL BL (MISCELLANEOUS) ×2 IMPLANT
DECANTER SPIKE VIAL GLASS SM (MISCELLANEOUS) ×2 IMPLANT
DERMABOND ADHESIVE PROPEN (GAUZE/BANDAGES/DRESSINGS) ×1
DERMABOND ADVANCED (GAUZE/BANDAGES/DRESSINGS)
DERMABOND ADVANCED .7 DNX12 (GAUZE/BANDAGES/DRESSINGS) ×1 IMPLANT
DERMABOND ADVANCED .7 DNX6 (GAUZE/BANDAGES/DRESSINGS) IMPLANT
DRAPE LAPAROTOMY 100X72X124 (DRAPES) ×2 IMPLANT
DRAPE MICROSCOPE LEICA (MISCELLANEOUS) ×2 IMPLANT
DRAPE POUCH INSTRU U-SHP 10X18 (DRAPES) ×2 IMPLANT
DRAPE SURG 17X23 STRL (DRAPES) ×2 IMPLANT
DURAPREP 26ML APPLICATOR (WOUND CARE) ×2 IMPLANT
ELECT REM PT RETURN 9FT ADLT (ELECTROSURGICAL) ×2
ELECTRODE REM PT RTRN 9FT ADLT (ELECTROSURGICAL) ×1 IMPLANT
GAUZE SPONGE 4X4 16PLY XRAY LF (GAUZE/BANDAGES/DRESSINGS) IMPLANT
GLOVE BIO SURGEON STRL SZ8 (GLOVE) ×1 IMPLANT
GLOVE BIOGEL PI IND STRL 8.5 (GLOVE) IMPLANT
GLOVE BIOGEL PI INDICATOR 8.5 (GLOVE) ×2
GLOVE ECLIPSE 6.5 STRL STRAW (GLOVE) ×2 IMPLANT
GLOVE EXAM NITRILE LRG STRL (GLOVE) IMPLANT
GLOVE EXAM NITRILE MD LF STRL (GLOVE) IMPLANT
GLOVE EXAM NITRILE XL STR (GLOVE) IMPLANT
GLOVE EXAM NITRILE XS STR PU (GLOVE) IMPLANT
GLOVE SURG SS PI 8.0 STRL IVOR (GLOVE) ×3 IMPLANT
GOWN BRE IMP SLV AUR LG STRL (GOWN DISPOSABLE) ×3 IMPLANT
GOWN BRE IMP SLV AUR XL STRL (GOWN DISPOSABLE) ×1 IMPLANT
GOWN STRL REIN 2XL LVL4 (GOWN DISPOSABLE) ×2 IMPLANT
KIT BASIN OR (CUSTOM PROCEDURE TRAY) ×2 IMPLANT
KIT ROOM TURNOVER OR (KITS) ×2 IMPLANT
NDL HYPO 21X1.5 SAFETY (NEEDLE) IMPLANT
NDL HYPO 25X1 1.5 SAFETY (NEEDLE) ×1 IMPLANT
NDL SPNL 18GX3.5 QUINCKE PK (NEEDLE) IMPLANT
NEEDLE HYPO 21X1.5 SAFETY (NEEDLE) ×2 IMPLANT
NEEDLE HYPO 25X1 1.5 SAFETY (NEEDLE) ×2 IMPLANT
NEEDLE SPNL 18GX3.5 QUINCKE PK (NEEDLE) ×2 IMPLANT
NS IRRIG 1000ML POUR BTL (IV SOLUTION) ×2 IMPLANT
PACK LAMINECTOMY NEURO (CUSTOM PROCEDURE TRAY) ×2 IMPLANT
PAD ARMBOARD 7.5X6 YLW CONV (MISCELLANEOUS) ×8 IMPLANT
RUBBERBAND STERILE (MISCELLANEOUS) ×4 IMPLANT
SPONGE GAUZE 4X4 12PLY (GAUZE/BANDAGES/DRESSINGS) IMPLANT
SPONGE LAP 4X18 X RAY DECT (DISPOSABLE) IMPLANT
SPONGE SURGIFOAM ABS GEL SZ50 (HEMOSTASIS) ×2 IMPLANT
STRIP CLOSURE SKIN 1/2X4 (GAUZE/BANDAGES/DRESSINGS) IMPLANT
SUT VIC AB 0 CT1 18XCR BRD8 (SUTURE) ×1 IMPLANT
SUT VIC AB 0 CT1 8-18 (SUTURE) ×2
SUT VIC AB 2-0 CT1 18 (SUTURE) ×2 IMPLANT
SUT VIC AB 3-0 SH 8-18 (SUTURE) ×3 IMPLANT
SYR 20CC LL (SYRINGE) ×1 IMPLANT
SYR 20ML ECCENTRIC (SYRINGE) ×2 IMPLANT
TOWEL OR 17X24 6PK STRL BLUE (TOWEL DISPOSABLE) ×2 IMPLANT
TOWEL OR 17X26 10 PK STRL BLUE (TOWEL DISPOSABLE) ×2 IMPLANT
WATER STERILE IRR 1000ML POUR (IV SOLUTION) ×2 IMPLANT

## 2012-04-20 NOTE — Preoperative (Signed)
Beta Blockers   Reason not to administer Beta Blockers:Not Applicable 

## 2012-04-20 NOTE — Anesthesia Procedure Notes (Signed)
Procedure Name: Intubation Date/Time: 04/20/2012 1:22 PM Performed by: Julian Reil Pre-anesthesia Checklist: Patient identified, Emergency Drugs available, Suction available and Patient being monitored Patient Re-evaluated:Patient Re-evaluated prior to inductionOxygen Delivery Method: Circle system utilized Preoxygenation: Pre-oxygenation with 100% oxygen Intubation Type: IV induction Ventilation: Mask ventilation without difficulty Laryngoscope Size: Mac and 3 Grade View: Grade III Tube type: Oral Tube size: 7.5 mm Number of attempts: 1 Airway Equipment and Method: Stylet Placement Confirmation: ETT inserted through vocal cords under direct vision,  positive ETCO2 and breath sounds checked- equal and bilateral Secured at: 22 cm Tube secured with: Tape Dental Injury: Teeth and Oropharynx as per pre-operative assessment

## 2012-04-20 NOTE — Transfer of Care (Signed)
Immediate Anesthesia Transfer of Care Note  Patient: [name]  Procedure(s) Performed: Procedure(s) (LRB) with comments: LUMBAR LAMINECTOMY/DECOMPRESSION MICRODISCECTOMY 1 LEVEL (Left) - LEFT Lumbar five-Sacral one diskectomy  Patient Location: PACU  Anesthesia Type:General  Level of Consciousness: awake, alert , oriented and patient cooperative  Airway & Oxygen Therapy: Patient Spontanous Breathing and Patient connected to nasal cannula oxygen  Post-op Assessment: Report given to PACU RN, Post -op Vital signs reviewed and stable and Patient moving all extremities  Post vital signs: Reviewed and stable  Complications: No apparent anesthesia complications

## 2012-04-20 NOTE — H&P (Signed)
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  She presents today with a history since May of 2013 of pain in her back and left lower extremity.  She says she thinks this may have started when she went to [employer] without any assistance and did a lot of lifting and moving of objects and loading her car and the like.  She has pain right after that.  It has been very severe on the left side. She first started with a walk-in clinic and received some medication which did not help.  She then went back to the hospital. She was told she possibly had a kidney stone.   She was evaluated by the urologist and was told that "no, I think your problem is in the back".  She has undergone deep massage and has had chiropractic care.  She is just in a great deal of pain. She says it was worse in June and July but it is still bad enough that she just is unable to do the things she needs to do.  She feels at times like the left foot is asleep.  She had a scary episode within the last two weeks where she felt she could not use the left lower extremity.  She finally at the behest of the chiropractor underwent an MRI of the lumbar spine which reveals a herniated disc.  She is here today.  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  She said the left leg does feel weak.  It is painful especially in the thigh.  She says sitting is also quite painful.  She has no symptoms on the right side.  She has some bladder issues.  She has felt a urinary urgency which is new.    PAST MEDICAL HISTORY:  Significant for hypertension, diabetes, colon cancer which is outside the colon and uterus, and NASH.    FAMILY HISTORY:    Mother is 57 in good health and has had a stroke.  Father died at age 5 and had cancer.    PAST SURGICAL HISTORY:  She has undergone a tonsillectomy, had a hysterectomy, bladder surgery, and had a  pubovaginal sling.  She has undergone another sling, had an appendectomy and small tumor removed in the past.    ALLERGIES:    SHE IS ALLERGIC TO MACROBID; DEMEROL CAUSES NAUSEA AND VOMITING, AND SHE IS VERY SENSITIVE TO PAIN MEDICATIONS BUT IS NOT ALLERGIC TO THEM.     [name]  #[id number] DOB:  08/19/1965   March 29, 2012 Page 2  SOCIAL HISTORY:    She does not smoke.  She does not use alcohol.  She does not have a history of substance abuse. She is 169 cm. and weighs 189 pounds.    REVIEW OF SYSTEMS:   Positive for eye glasses, cataracts, hypertension, hypercholesterolemia, leg pain with walking, nausea, vomiting, liver disease, abdominal pain, leg weakness, back pain and diabetes.  She denies allergic, hematologic neurological, psychiatric, skin, constitutional, respiratory, or GU problems.    MEDICATIONS:    Propranolol, vitamin B injections and Aleve.  PHYSICAL EXAMINATION:  On examination, she is alert and oriented x four and answering all questions appropriately.  Memory, language, attention span and fund of knowledge are normal.  2+ reflexes right ankle, 2+ at the knees, not elicited at the left  ankle.  She has 2+ reflexes in the biceps, triceps, brachioradialis, knees and ankles.  She can heel walk and toe walk though she does it with some grimacing.  Positive straight leg raising on the left side.  Downgoing toes to plantar stimulation. Intact proprioception.  Normal muscle tone, bulk and coordination.  PERRL.  Full EOM's.  Full visual fields.  Hearing is intact to finger rub bilaterally.  Symmetric facial sensation and movement.  Uvula elevates in the midline.  Shoulder shrug is normal.  Tongue protrudes in the midline.  No cervical masses or bruits.  Lungs are clear.  Heart: regular rate and rhythm.  No murmurs or rubs.  Pulses are good at the wrists bilaterally.  DIAGNOSTIC STUDIES:   MRI is reviewed showing degenerative disc at 3-4, 4-5 and 5-1, herniated disc at 5-1 on the left  side.  Conus is normal.  Cauda equina is normal.  No paraspinous soft tissue abnormalities appreciated.  DIAGNOSIS:     Displaced disc left L5-S1, left S1 radiculopathy.    SUMMARY:     She would like to proceed with operative decompression on January 8th.  The risks and benefits were explained.  I gave her a very detailed instruction sheet going over the procedure.

## 2012-04-20 NOTE — Anesthesia Preprocedure Evaluation (Signed)
Anesthesia Evaluation    History of Anesthesia Complications (+) PONV  Airway       Dental   Pulmonary sleep apnea ,          Cardiovascular hypertension, + dysrhythmias     Neuro/Psych  Headaches,    GI/Hepatic GERD-  ,(+) Hepatitis -  Endo/Other  diabetesHyperthyroidism   Renal/GU Renal disease     Musculoskeletal   Abdominal   Peds  Hematology   Anesthesia Other Findings   Reproductive/Obstetrics                           Anesthesia Physical Anesthesia Plan  ASA: III  Anesthesia Plan: General   Post-op Pain Management:    Induction: Intravenous  Airway Management Planned: Oral ETT  Additional Equipment:   Intra-op Plan:   Post-operative Plan: Extubation in OR  Informed Consent:   Plan Discussed with: CRNA, Anesthesiologist and Surgeon  Anesthesia Plan Comments:         Anesthesia Quick Evaluation

## 2012-04-20 NOTE — Plan of Care (Signed)
Problem: Consults Goal: Diagnosis - Spinal Surgery Outcome: Completed/Met Date Met:  04/20/12 Microdiscectomy

## 2012-04-20 NOTE — Op Note (Signed)
04/20/2012  2:57 PM  PATIENT:  [name]  47 y.o. female  PRE-OPERATIVE DIAGNOSIS:  lumbar herniated disc lumbar radiculopathy left L5/S1  POST-OPERATIVE DIAGNOSIS:  lumbar herniated disc,lumbar radiculopathy Left L5/S1 PROCEDURE:  Procedure(s): LUMBAR  DISCECTOMY 1 LEVEL L5/S1, left Microdissection  SURGEON:  Surgeon(s): Winfield Cunas, MD Eustace Moore, MD  ASSISTANTS:David Ronnald Ramp  ANESTHESIA:   general  EBL:  Total I/O In: 1250 [I.V.:1250] Out: -   BLOOD ADMINISTERED:none  CELL SAVER GIVEN:none  COUNT:per nursing  DRAINS: none   SPECIMEN:  No Specimen  DICTATION: [name] was taken to the operating room intubated and placed under a general anesthetic. She was positioned on a Wilson frame with all pressure points properly padded. Her back was prepped and draped in a sterile fashion. I infiltrated 10cc lidocaine into the lumbar incision. I opened the incision with a  10 blade and sharply dissected down to the thoracolumbar fascia. I exposed the L5 and S1 lamina on the left side and confirmed my location with xray. I then opened the ligamentum flavum with a 15 blade and removed enough ligament to expose the thecal sac. I with microdissection and Dr. Ronnald Ramp' assistance retracted the thecal sac medially and exposed a large lump of disc contained within the annulus. I opened the annulus and started to remove the disc. We were able to remove disc to decompress the Left S1 root. We inspected the disc space and nerve root medially, laterally, rostrally, and caudally. I felt the decompression of the disc space and nerve root were adequate. I irrigated the wound. I infiltrated exparel into the paraspinous musculature on the left side before closing. I closed the wound with vicryl sutures approximating the thoracolumbar opening done in a semicircular manner, subcutaneous, and subcuticular tissue. I used dermabond for the dressing.   PLAN OF CARE: Admit for overnight  observation  PATIENT DISPOSITION:  PACU - hemodynamically stable.   Delay start of Pharmacological VTE agent (>24hrs) due to surgical blood loss or risk of bleeding:  yes

## 2012-04-20 NOTE — Anesthesia Postprocedure Evaluation (Signed)
  Anesthesia Post-op Note  Patient: [name]  Procedure(s) Performed: Procedure(s) (LRB) with comments: LUMBAR LAMINECTOMY/DECOMPRESSION MICRODISCECTOMY 1 LEVEL (Left) - LEFT Lumbar five-Sacral one diskectomy  Patient Location: PACU  Anesthesia Type:General  Level of Consciousness: awake, alert , oriented and patient cooperative  Airway and Oxygen Therapy: Patient Spontanous Breathing  Post-op Pain: mild  Post-op Assessment: Post-op Vital signs reviewed, Patient's Cardiovascular Status Stable, Respiratory Function Stable, Patent Airway, No signs of Nausea or vomiting and Pain level controlled  Post-op Vital Signs: stable  Complications: No apparent anesthesia complications

## 2012-04-21 ENCOUNTER — Encounter (HOSPITAL_COMMUNITY): Payer: Self-pay | Admitting: Neurosurgery

## 2012-04-21 LAB — GLUCOSE, CAPILLARY: Glucose-Capillary: 102 mg/dL — ABNORMAL HIGH (ref 70–99)

## 2012-04-21 MED ORDER — HYDROCODONE-ACETAMINOPHEN 5-325 MG PO TABS: 1.0000 | ORAL_TABLET | Freq: Four times a day (QID) | ORAL | Status: DC | PRN

## 2012-04-21 MED ORDER — CYCLOBENZAPRINE HCL 10 MG PO TABS: 10.0000 mg | ORAL_TABLET | Freq: Three times a day (TID) | ORAL | Status: DC | PRN

## 2012-04-21 NOTE — Progress Notes (Signed)
Pt given D/C instructions with Rx's, verbal understanding given. Pt D/C'd home via wheelchair @ 1030 with husband per MD order. Holli Humbles, RN

## 2012-09-30 DIAGNOSIS — H47329 Drusen of optic disc, unspecified eye: Secondary | ICD-10-CM | POA: Insufficient documentation

## 2012-10-26 DIAGNOSIS — Z9849 Cataract extraction status, unspecified eye: Secondary | ICD-10-CM | POA: Insufficient documentation

## 2012-10-26 DIAGNOSIS — Z961 Presence of intraocular lens: Secondary | ICD-10-CM | POA: Insufficient documentation

## 2012-12-27 ENCOUNTER — Other Ambulatory Visit: Payer: Self-pay | Admitting: *Deleted

## 2012-12-27 NOTE — Telephone Encounter (Signed)
Faxed refill request. Please advise.

## 2012-12-28 MED ORDER — CYANOCOBALAMIN 1000 MCG/ML IJ SOLN: 1000.0000 ug | INTRAMUSCULAR | Status: DC

## 2012-12-28 NOTE — Telephone Encounter (Signed)
Sent, needs CPE.  Thanks.

## 2012-12-28 NOTE — Telephone Encounter (Signed)
Left a message on answering machine and voicemail to call back.

## 2012-12-29 NOTE — Telephone Encounter (Signed)
Left detailed message on voicemail at home and cell.

## 2013-02-14 ENCOUNTER — Other Ambulatory Visit: Payer: Self-pay | Admitting: Obstetrics and Gynecology

## 2013-03-20 ENCOUNTER — Other Ambulatory Visit: Payer: Self-pay | Admitting: Family Medicine

## 2013-03-20 DIAGNOSIS — I10 Essential (primary) hypertension: Secondary | ICD-10-CM

## 2013-03-27 ENCOUNTER — Other Ambulatory Visit (INDEPENDENT_AMBULATORY_CARE_PROVIDER_SITE_OTHER): Payer: BC Managed Care – PPO

## 2013-03-27 DIAGNOSIS — I10 Essential (primary) hypertension: Secondary | ICD-10-CM

## 2013-03-27 LAB — LIPID PANEL
Cholesterol: 223 mg/dL — ABNORMAL HIGH (ref 0–200)
HDL: 39.1 mg/dL (ref >39.00)
Triglycerides: 195 mg/dL — ABNORMAL HIGH (ref 0.0–149.0)
VLDL: 39 mg/dL (ref 0.0–40.0)

## 2013-03-27 LAB — COMPREHENSIVE METABOLIC PANEL
Alkaline Phosphatase: 33 U/L — ABNORMAL LOW (ref 39–117)
BUN: 23 mg/dL (ref 6–23)
CO2: 27 mEq/L (ref 19–32)
Creatinine, Ser: 1 mg/dL (ref 0.4–1.2)
GFR: 65.5 mL/min (ref >60.00)
Glucose, Bld: 133 mg/dL — ABNORMAL HIGH (ref 70–99)
Total Bilirubin: 1 mg/dL (ref 0.3–1.2)

## 2013-03-29 ENCOUNTER — Ambulatory Visit (INDEPENDENT_AMBULATORY_CARE_PROVIDER_SITE_OTHER): Payer: BC Managed Care – PPO | Admitting: Family Medicine

## 2013-03-29 ENCOUNTER — Encounter: Payer: Self-pay | Admitting: Family Medicine

## 2013-03-29 VITALS — BP 110/78 | HR 66 | Temp 97.9°F | Ht 67.5 in | Wt 203.0 lb

## 2013-03-29 DIAGNOSIS — Z0000 Encounter for general adult medical examination without abnormal findings: Secondary | ICD-10-CM

## 2013-03-29 DIAGNOSIS — I10 Essential (primary) hypertension: Secondary | ICD-10-CM

## 2013-03-29 DIAGNOSIS — R739 Hyperglycemia, unspecified: Secondary | ICD-10-CM

## 2013-03-29 DIAGNOSIS — D519 Vitamin B12 deficiency anemia, unspecified: Secondary | ICD-10-CM

## 2013-03-29 DIAGNOSIS — R7309 Other abnormal glucose: Secondary | ICD-10-CM

## 2013-03-29 DIAGNOSIS — R7989 Other specified abnormal findings of blood chemistry: Secondary | ICD-10-CM

## 2013-03-29 DIAGNOSIS — D518 Other vitamin B12 deficiency anemias: Secondary | ICD-10-CM

## 2013-03-29 DIAGNOSIS — E538 Deficiency of other specified B group vitamins: Secondary | ICD-10-CM

## 2013-03-29 MED ORDER — CYANOCOBALAMIN 1000 MCG/ML IJ SOLN: 1000.0000 ug | INTRAMUSCULAR | Status: DC

## 2013-03-29 MED ORDER — HYDROCHLOROTHIAZIDE 25 MG PO TABS: 25.0000 mg | ORAL_TABLET | Freq: Every day | ORAL | Status: DC | PRN

## 2013-03-29 NOTE — Progress Notes (Signed)
Pre-visit discussion using our clinic review tool. No additional management support is needed unless otherwise documented below in the visit note.  CPE- See plan.  Routine anticipatory guidance given to patient.  See health maintenance. Tetanus <10 years per patient, with a cut that needed sutures.  Flu shot encouraged.   Breast and pelvic exam at gyn clinic.   DXA done today.  Mammogram done prev in 2014 Living will d/w pt.  Husband designated if incapacitated.    H/o B12 anemia and due for labs.  On replacement, every other week, IM.   H/o NASH and hyperglycemia.  Discussed diet and exercise.  Due for A1c.  See notes on labs.  She is going to f/u with endo in the future.   HCTZ prn for edema.  Taking BB for POTS followed at [facility].    PMH and SH reviewed  Meds, vitals, and allergies reviewed.   ROS: See HPI.  Otherwise negative.    GEN: nad, alert and oriented HEENT: mucous membranes moist NECK: supple w/o LA CV: rrr. PULM: ctab, no inc wob ABD: soft, +bs EXT: no edema SKIN: no acute rash

## 2013-03-29 NOTE — Patient Instructions (Addendum)
Go to the lab on the way out.  We'll contact you with your lab report. I would get a flu shot each fall.   Work on your schedule/diet/exercise routine in the meantime.

## 2013-03-30 DIAGNOSIS — Z0000 Encounter for general adult medical examination without abnormal findings: Secondary | ICD-10-CM | POA: Insufficient documentation

## 2013-03-30 LAB — HEMOGLOBIN A1C: Hgb A1c MFr Bld: 6.3 % (ref 4.6–6.5)

## 2013-03-30 LAB — CBC WITH DIFFERENTIAL/PLATELET
Basophils Absolute: 0 10*3/uL (ref 0.0–0.1)
Basophils Relative: 0.6 % (ref 0.0–3.0)
Eosinophils Absolute: 0.1 10*3/uL (ref 0.0–0.7)
MCHC: 34.1 g/dL (ref 30.0–36.0)
MCV: 88.3 fl (ref 78.0–100.0)
Monocytes Absolute: 0.5 10*3/uL (ref 0.1–1.0)
Neutrophils Relative %: 55.4 % (ref 43.0–77.0)
Platelets: 267 10*3/uL (ref 150.0–400.0)
RBC: 4.94 Mil/uL (ref 3.87–5.11)
RDW: 13.1 % (ref 11.5–14.6)

## 2013-03-30 NOTE — Assessment & Plan Note (Signed)
D/w pt about diet and exercise, weight.  See notes on labs.

## 2013-03-30 NOTE — Assessment & Plan Note (Signed)
Continue as is, encouraged diet and exercise.

## 2013-03-30 NOTE — Assessment & Plan Note (Signed)
Routine anticipatory guidance given to patient.  See health maintenance. Tetanus <10 years per patient, with a cut that needed sutures.  Flu shot encouraged.   Breast and pelvic exam at gyn clinic.   DXA done today.  Mammogram done prev in 2014 Living will d/w pt.  Husband designated if incapacitated.

## 2013-03-30 NOTE — Assessment & Plan Note (Signed)
See notes on labs. 

## 2013-04-10 ENCOUNTER — Encounter: Payer: Self-pay | Admitting: Family Medicine

## 2013-04-10 ENCOUNTER — Ambulatory Visit (INDEPENDENT_AMBULATORY_CARE_PROVIDER_SITE_OTHER): Payer: BC Managed Care – PPO | Admitting: Family Medicine

## 2013-04-10 ENCOUNTER — Ambulatory Visit (INDEPENDENT_AMBULATORY_CARE_PROVIDER_SITE_OTHER)
Admission: RE | Admit: 2013-04-10 | Discharge: 2013-04-10 | Disposition: A | Discharge status: Home / Self Care | Payer: BC Managed Care – PPO | Source: Ambulatory Visit | Attending: Family Medicine | Admitting: Family Medicine

## 2013-04-10 VITALS — BP 108/66 | HR 95 | Temp 99.7°F | Wt 204.5 lb

## 2013-04-10 DIAGNOSIS — R05 Cough: Secondary | ICD-10-CM

## 2013-04-10 DIAGNOSIS — R059 Cough, unspecified: Secondary | ICD-10-CM

## 2013-04-10 MED ORDER — HYDROCODONE-HOMATROPINE 5-1.5 MG/5ML PO SYRP: 5.0000 mL | ORAL_SOLUTION | Freq: Three times a day (TID) | ORAL | Status: DC | PRN

## 2013-04-10 NOTE — Progress Notes (Signed)
Pre-visit discussion using our clinic review tool. No additional management support is needed unless otherwise documented below in the visit note.  Sick for the last 5 days.  ST, then voice was hoarse.  Since then with fatigue, cough, fever (101.6).  Chest is sore but better today- prev with pain with a cough or deep breath.  Taking mucinex in the meantime with some relief of cough.  Taking tylenol.  Food doesn't take normal. Still drinking fluids well.   Meds, vitals, and allergies reviewed.   ROS: See HPI.  Otherwise, noncontributory.  GEN: nad, alert and oriented, she looks like she doesn't feel well but not toxic appearing HEENT: mucous membranes moist, tm w/o erythema, nasal exam w/o erythema, scant clear discharge noted,  OP wnl NECK: supple w/o LA CV: rrr.   PULM: ctab, no inc wob, cough noted, R anterior/lateral chest wall ttp EXT: no edema SKIN: no acute rash

## 2013-04-10 NOTE — Assessment & Plan Note (Signed)
H/o PNA, check CXR today.  If infiltrate, then start abx.  Likely viral.  Out of window for tamiflu treatment, so we didn't check flu today.  Nontoxic. Supportive care o/w.  D/w pt.  She agrees.   Okay for outpatient f/u.

## 2013-04-10 NOTE — Patient Instructions (Signed)
Take plain mucinex and the hycodan for the cough.  Go to the lab on the way out.  We'll contact you with your xray report. Drink plenty of fluids, take tylenol as needed, and gargle with warm salt water for your throat.  This should gradually improve.  Take care.  Let us know if you have other concerns.

## 2013-04-14 ENCOUNTER — Telehealth: Payer: Self-pay

## 2013-04-14 ENCOUNTER — Other Ambulatory Visit: Payer: Self-pay | Admitting: Internal Medicine

## 2013-04-14 MED ORDER — AZITHROMYCIN 250 MG PO TABS: ORAL_TABLET | ORAL | Status: DC

## 2013-04-14 NOTE — Telephone Encounter (Signed)
Pt left v/m; pt was seen 04/10/13; pt said had CXR which did not show pneumonia; but pts cough has become productive with yellow phlegm. Pt request antibiotic to [facility]. Unable to reach pt for further details.Please advise.

## 2013-04-14 NOTE — Telephone Encounter (Signed)
Patient notified as instructed by telephone that z pak sent to [facility]. Pt will cb if symptoms are not completely resolved.

## 2013-04-14 NOTE — Telephone Encounter (Signed)
Sent in zpack

## 2013-07-06 ENCOUNTER — Encounter: Payer: Self-pay | Admitting: *Deleted

## 2013-10-03 ENCOUNTER — Other Ambulatory Visit: Payer: Self-pay

## 2013-10-03 MED ORDER — GLUCOSE BLOOD VI STRP: ORAL_STRIP | Status: DC

## 2013-10-03 MED ORDER — ONETOUCH DELICA LANCETS FINE MISC: Status: DC

## 2013-10-03 NOTE — Telephone Encounter (Signed)
Pt discussed with Dr Damita Dunnings about prescribing her diabetic supplies to [facility]. Advised done.

## 2014-01-26 ENCOUNTER — Other Ambulatory Visit: Payer: Self-pay

## 2014-03-12 ENCOUNTER — Other Ambulatory Visit: Payer: Self-pay | Admitting: Dermatology

## 2014-04-01 ENCOUNTER — Other Ambulatory Visit: Payer: Self-pay | Admitting: Family Medicine

## 2014-04-03 ENCOUNTER — Other Ambulatory Visit: Payer: Self-pay | Admitting: Family Medicine

## 2014-08-02 ENCOUNTER — Telehealth: Payer: Self-pay | Admitting: Family Medicine

## 2014-08-02 NOTE — Telephone Encounter (Signed)
Noted catheterization normal 12/2010 per chart. This has been 4 yrs ago. If persistent chest pain do recommend ER evaluation. Otherwise plz schedule next available appt in office, but needs evaluation.

## 2014-08-02 NOTE — Telephone Encounter (Signed)
Patient Name: [name] DOB: [birth year] Initial Comment Caller states been having indigestion and chest pain Nurse Assessment Nurse: Vallery Sa, RN, Cathy Date/Time (Eastern Time): 08/02/2014 11:48:31 AM Confirm and document reason for call. If symptomatic, describe symptoms. ---Caller states she developed indigestion and chest pain yesterday that is still present. No severe breathing or swallowing difficulty. No injury in the past 3 days. Has the patient traveled out of the country within the last 30 days? ---No Does the patient require triage? ---Yes Related visit to physician within the last 2 weeks? ---No Does the PT have any chronic conditions? (i.e. diabetes, asthma, etc.) ---Yes List chronic conditions. ---Tacycardia, Liver problems, Diabetes Did the patient indicate they were pregnant? ---No Guidelines Guideline Title Affirmed Question Affirmed Notes Chest Pain [1] Chest pain lasts > 5 minutes AND [2] history of heart disease (i.e., heart attack, bypass surgery, angina, angioplasty, CHF; not just a heart murmur) Tachycardia Final Disposition User Call EMS 911 Now [place], RN, [employer] Comments Called the office backline and notified Neoma Laming, LPN who will notify MD  PT refused 911

## 2014-08-02 NOTE — Telephone Encounter (Signed)
Unable to reach pt by phone but spoke with Mr Loletha Grayer and pt is already being seen at [facility]; a hospital in [place]. Mr Loletha Grayer appreciated call.

## 2014-08-02 NOTE — Telephone Encounter (Signed)
Juliann Pulse with [employer] said pt called and is on way home from beach trip; pt is about 2 hours from home; on 08/01/14 started with CP and indigestion; CP is more tolerable but continuing today but no SOB. Pt did not give level of pain. Pt has hx of tachycardia, liver problems and diabetes. Pt was advised by [facility] to go to nearest ED for eval. Pt will talk with husband and decide what to do; pt declined 911. Dr Damita Dunnings out of office today.Please advise.

## 2014-08-02 NOTE — Telephone Encounter (Signed)
Noted, thanks!

## 2014-08-06 ENCOUNTER — Encounter: Payer: Self-pay | Admitting: Family Medicine

## 2014-08-06 ENCOUNTER — Ambulatory Visit (INDEPENDENT_AMBULATORY_CARE_PROVIDER_SITE_OTHER): Payer: BLUE CROSS/BLUE SHIELD | Admitting: Family Medicine

## 2014-08-06 VITALS — BP 102/70 | HR 75 | Temp 98.6°F | Wt 207.5 lb

## 2014-08-06 DIAGNOSIS — R0789 Other chest pain: Secondary | ICD-10-CM

## 2014-08-06 DIAGNOSIS — Z1211 Encounter for screening for malignant neoplasm of colon: Secondary | ICD-10-CM | POA: Diagnosis not present

## 2014-08-06 NOTE — Assessment & Plan Note (Signed)
Likely GERD related, with possible esophageal spasm/stricture.  Had been off PPI, with sx at rest. Improved with NTG but EKG and troponin wnl.  Okay for outpatient f/u.  Clearly improved now.  D/w pt.  Would need colon cancer screening anyway, so refer to GI.  She would like to be see at [facility], will refer.  Would be candidate for colonoscopy with consideration for EGD per GI.  Continue PPI for now.  She agrees.  >25 minutes spent in face to face time with patient, >50% spent in counselling or coordination of care.

## 2014-08-06 NOTE — Progress Notes (Signed)
Pre visit review using our clinic review tool, if applicable. No additional management support is needed unless otherwise documented below in the visit note.  ER f/u.  Was coming back from [facility], stopped in [place] with chest pain.  It actually started the night before but was getting worse and she went to ER in [place].  It started in the chest, then up the L side of neck and through to her back.  She had the feeling of something stuck in her throat. She had to sit up in the bed and took some malox with some relief, the night before.   NTG in the ER helped a lot with the pain.  CXR neg, EKG wnl per patient. Labs reviewed, scanned.  Cmet and cbc unremarkable.  Troponin neg x2.    She had been off omeprazole for the last few months.  She restarted that in the meantime.  No similar sx now, not nearly as severe.  She still notes some irritation with swallowing, esp cold liquids.   No voice changes.  No cough but more throat clearing with/after eating.  Not vomiting or passing blood.  No black stools.    Meds, vitals, and allergies reviewed.   ROS: See HPI.  Otherwise, noncontributory.  GEN: nad, alert and oriented HEENT: mucous membranes moist NECK: supple w/o LA CV: rrr.  PULM: ctab, no inc wob ABD: soft, +bs EXT: no edema SKIN: no acute rash

## 2014-08-06 NOTE — Patient Instructions (Addendum)
[name] will call about your referral. Stay on the omeprazole in the meantime.   Take care.  Glad to see you.

## 2014-08-10 ENCOUNTER — Ambulatory Visit (INDEPENDENT_AMBULATORY_CARE_PROVIDER_SITE_OTHER): Payer: BLUE CROSS/BLUE SHIELD | Admitting: Family Medicine

## 2014-08-10 ENCOUNTER — Encounter: Payer: Self-pay | Admitting: Family Medicine

## 2014-08-10 VITALS — BP 104/80 | HR 67 | Temp 98.5°F

## 2014-08-10 DIAGNOSIS — K219 Gastro-esophageal reflux disease without esophagitis: Secondary | ICD-10-CM

## 2014-08-10 MED ORDER — OMEPRAZOLE 20 MG PO CPDR: 20.0000 mg | DELAYED_RELEASE_CAPSULE | Freq: Two times a day (BID) | ORAL | Status: DC

## 2014-08-10 NOTE — Assessment & Plan Note (Signed)
Add on tums prn, inc ppi to bid, elevated head of bed.  I talked with Rosaria Ferries and our info has been passed along to GI re: referral.  I'll await GI input.

## 2014-08-10 NOTE — Patient Instructions (Signed)
Take the prilosec twice a day and take tums if needed on top of that.  Elevate the head of your bed and I'll check on the GI referral, to make sure our part has gone through.  Take care.

## 2014-08-10 NOTE — Progress Notes (Signed)
Pre visit review using our clinic review tool, if applicable. No additional management support is needed unless otherwise documented below in the visit note.   See prev note re: GERD sx and chest sx.  On omeprazole now.  Still with burning in the throat, mouth and lips are irritated.  No vomiting but has acidic material bubbling into her throat with burping.  She can still swallow w/o food sticking most of the time but some trouble with a cracker today.  She doesn't have the atypical chest pain like last week.  Sleeping on a flat mattress, HOB not elevated.  Taking omeprazole qhs, 6m.    Meds, vitals, and allergies reviewed.   ROS: See HPI.  Otherwise, noncontributory.  nad Tm wnl mmm Lips chapped, slightly erythema of the OP.   Neck supple, no LA rrr abd soft, not ttp

## 2014-08-16 ENCOUNTER — Ambulatory Visit: Payer: BLUE CROSS/BLUE SHIELD | Admitting: Primary Care

## 2014-08-16 ENCOUNTER — Encounter: Payer: Self-pay | Admitting: Primary Care

## 2014-08-16 ENCOUNTER — Ambulatory Visit (INDEPENDENT_AMBULATORY_CARE_PROVIDER_SITE_OTHER)
Admission: RE | Admit: 2014-08-16 | Discharge: 2014-08-16 | Disposition: A | Discharge status: Home / Self Care | Payer: BLUE CROSS/BLUE SHIELD | Source: Ambulatory Visit | Attending: Primary Care | Admitting: Primary Care

## 2014-08-16 ENCOUNTER — Ambulatory Visit (INDEPENDENT_AMBULATORY_CARE_PROVIDER_SITE_OTHER): Payer: BLUE CROSS/BLUE SHIELD | Admitting: Primary Care

## 2014-08-16 VITALS — BP 110/80 | HR 72 | Temp 98.0°F | Ht 67.5 in | Wt 206.4 lb

## 2014-08-16 DIAGNOSIS — M545 Low back pain, unspecified: Secondary | ICD-10-CM

## 2014-08-16 DIAGNOSIS — G8911 Acute pain due to trauma: Secondary | ICD-10-CM

## 2014-08-16 MED ORDER — TRAMADOL HCL 50 MG PO TABS: 50.0000 mg | ORAL_TABLET | Freq: Three times a day (TID) | ORAL | Status: DC | PRN

## 2014-08-16 MED ORDER — CYCLOBENZAPRINE HCL 5 MG PO TABS: 5.0000 mg | ORAL_TABLET | Freq: Three times a day (TID) | ORAL | Status: DC | PRN

## 2014-08-16 NOTE — Progress Notes (Signed)
Pre visit review using our clinic review tool, if applicable. No additional management support is needed unless otherwise documented below in the visit note. 

## 2014-08-16 NOTE — Patient Instructions (Signed)
Complete xray(s) prior to leaving today. I will contact you regarding your results. You may take the cyclobenzaprine tablets three times daily as needed for muscle spasms. This may make you drowsy. You may take the Tramadol as needed for pain. This may make you drowsy, so start at bedtime. This injury will take several weeks to completely heal. Continue the application of ice. Follow up as needed.

## 2014-08-16 NOTE — Progress Notes (Signed)
Subjective:    Patient ID: [name], female    DOB: 10-08-1965, 49 y.o.   MRN: [id number]  HPI  [name] is a 49 year old female who presents today with a chief complaint of pain to her bilateral mid lower back, lef hip and thigh, and right foot since falling 2 days ago. She fell backwards off of a step stool and hit the side of the dresser with the left side of her back and hip. She's applied ice for 2 hours Tuesday night and throughout the day on Wednesday. She's also been taking Aleve with some temporary relief, but her pain has started to worsen and the Aleve is no longer helping. She denies radiculopathy, headache, syncope.   Review of Systems  Respiratory: Negative for shortness of breath.   Cardiovascular: Negative for chest pain.  Musculoskeletal: Positive for myalgias and back pain.  Neurological: Negative for dizziness and light-headedness.       Past Medical History  Diagnosis Date  . GERD (gastroesophageal reflux disease)   . Hyperlipidemia   . Thyroid nodule   . RLS (restless legs syndrome)   . Compression fracture of cervical vertebra     C5-6  . Treadmill stress test negative for angina pectoris 01/2000  . Mononucleosis 2005  . B12 deficiency   . Diverticulitis     04/2008  . Hypertension   . Pneumonia     HX PNEUMONIA   . Diabetes mellitus     DIET CONTROLLED  . Kidney stones   . Neuromuscular disorder     DR LOVE HAD 1 EPISODE 2010 INVOL MOVEMENT  LT ARM   . NASH (nonalcoholic steatohepatitis)   . Hyperthyroidism     PATIENT DENIES THYROID NODULE  . PONV (postoperative nausea and vomiting)     1994 HYST  WOKE UP    . Dysrhythmia     POTS (postural orthostatic tachycardia syndrome)    History   Social History  . Marital Status: Married    Spouse Name: N/A  . Number of Children: 2  . Years of Education: N/A   Occupational History  .    [employer]     Social History Main Topics  . Smoking status: Never Smoker   . Smokeless tobacco: Never Used    . Alcohol Use: No  . Drug Use: No  . Sexual Activity: Not on file   Other Topics Concern  . Not on file   Social History Narrative   Married.     Past Surgical History  Procedure Laterality Date  . Abdominal hysterectomy    . Bladder repair  2003  . Esophagogastroduodenoscopy  03/2000    Negative  . Stress cardiolite  12/2002    Negative  . Cardiovascular stress test  07/2006    Negative, (equivocal), increased uptake of radiotracer mid back  . US abdominal  08/2003 & 01/2005    Fatty liver (both times)  . Appendectomy    . Cardiovascular stress test      11/2010, no ischemia  . Doppler echocardiography      small effusion, but normal o/w  . Tonsillectomy    . Pubic vaginal sling      2002, 2006  . Cardiac catheterization      12/2010 with Dr. Gwenlyn Found, normal  . Cholecystectomy      04/14/12   IN [facility]  . Lumbar laminectomy/decompression microdiscectomy  04/20/2012    Procedure: LUMBAR LAMINECTOMY/DECOMPRESSION MICRODISCECTOMY 1 LEVEL;  Surgeon:  Winfield Cunas, MD;  Location: [place] [facility];  Service: Neurosurgery;  Laterality: Left;  LEFT Lumbar five-Sacral one diskectomy    Family History  Problem Relation Age of Onset  . Stroke Mother   . Heart disease Father     2 MI's  . Bladder Cancer Father   . Diabetes Other 80  . Cancer Other   . Hypertension Other   . Colon cancer Neg Hx   . Breast cancer Neg Hx     Allergies  Allergen Reactions  . Nitrofurantoin Rash  . Advair Diskus [Fluticasone-Salmeterol]     Intolerant- palpitations.   . Barbiturates     Hallucinations  . Cefuroxime Axetil     REACTION: GI upset  . Clarithromycin     REACTION: nausea  . Demerol [Meperidine] Nausea Only  . Food     [place]  . Oxybutynin     REACTION: fluid retention, headaches    Current Outpatient Prescriptions on File Prior to Visit  Medication Sig Dispense Refill  . cyanocobalamin (,VITAMIN B-12,) 1000 MCG/ML injection Inject 1 mL (1,000 mcg total) into the  muscle every 14 (fourteen) days. 10 mL 1  . hydrochlorothiazide (HYDRODIURIL) 25 MG tablet Take 1 tablet (25 mg total) by mouth daily as needed. * Needs office visit for additional refills* 90 tablet 0  . Multiple Vitamin (MULTIVITAMIN) tablet Take 1 tablet by mouth daily.      [employer] omeprazole (PRILOSEC) 20 MG capsule Take 1 capsule (20 mg total) by mouth 2 (two) times daily before a meal.    . ONE TOUCH ULTRA TEST test strip USE ONE TWICE DAILY  200 each 0  . ONETOUCH DELICA LANCETS FINE MISC check blood sugar twice daily and as directed  200 each 0  . propranolol (INDERAL) 20 MG tablet Take 20 mg by mouth as needed.     . propranolol ER (INDERAL LA) 80 MG 24 hr capsule Take 80 mg by mouth 2 (two) times daily.     No current facility-administered medications on file prior to visit.    BP 110/80 mmHg  Pulse 72  Temp(Src) 98 F (36.7 C) (Oral)  Ht 5' 7.5" (1.715 m)  Wt 206 lb 6.4 oz (93.622 kg)  BMI 31.83 kg/m2  SpO2 96%    Objective:   Physical Exam  Constitutional: She is oriented to person, place, and time. She appears well-developed.  Appears very uncomfortable and in pain  Neck: Normal range of motion.  Cardiovascular: Normal rate and regular rhythm.   Pulmonary/Chest: Effort normal and breath sounds normal.  Musculoskeletal:       Left hip: She exhibits tenderness.       Lumbar back: She exhibits decreased range of motion and tenderness.  Neurological: She is alert and oriented to person, place, and time. She has normal reflexes.  Skin: Skin is warm and dry.     Abrasion to mid lower back  Brusing to right lateral foot          Assessment & Plan:  Acute low back pain due to trauma:  Fall 2 days ago, landing on lower back and left hip. No radiculopathy. Due to history of ruptured disc, will obtain xray of lumbar spine. RX for Flexeril and Tramadol PRN. Education provided to ice and keep body in motion to prevent stiffness. Also instructed that this may take several  weeks to heal. Follow up as needed.

## 2014-08-24 ENCOUNTER — Ambulatory Visit (INDEPENDENT_AMBULATORY_CARE_PROVIDER_SITE_OTHER): Payer: BLUE CROSS/BLUE SHIELD | Admitting: Family Medicine

## 2014-08-24 ENCOUNTER — Encounter: Payer: Self-pay | Admitting: Family Medicine

## 2014-08-24 VITALS — BP 104/70 | HR 79 | Temp 98.4°F | Wt 206.0 lb

## 2014-08-24 DIAGNOSIS — N3090 Cystitis, unspecified without hematuria: Secondary | ICD-10-CM | POA: Diagnosis not present

## 2014-08-24 DIAGNOSIS — R300 Dysuria: Secondary | ICD-10-CM | POA: Diagnosis not present

## 2014-08-24 LAB — POCT URINALYSIS DIPSTICK
Bilirubin, UA: NEGATIVE
Glucose, UA: NEGATIVE
Ketones, UA: NEGATIVE
NITRITE UA: NEGATIVE
Spec Grav, UA: >=1.03
UROBILINOGEN UA: 0.2
pH, UA: 6

## 2014-08-24 MED ORDER — SULFAMETHOXAZOLE-TRIMETHOPRIM 800-160 MG PO TABS: 1.0000 | ORAL_TABLET | Freq: Two times a day (BID) | ORAL | Status: DC

## 2014-08-24 MED ORDER — OMEPRAZOLE 20 MG PO CPDR: 20.0000 mg | DELAYED_RELEASE_CAPSULE | Freq: Every day | ORAL | Status: DC

## 2014-08-24 NOTE — Progress Notes (Signed)
Pre visit review using our clinic review tool, if applicable. No additional management support is needed unless otherwise documented below in the visit note.  Dysuria:yes, urgency, bloody urine, frequency.   duration of symptoms: 1-2 days abdominal pain:no fevers:no back pain: some lower pain attributed to a fall, not from UTI.  vomiting:no   Meds, vitals, and allergies reviewed.   ROS: See HPI.  Otherwise negative.    GEN: nad, alert and oriented HEENT: mucous membranes moist NECK: supple CV: rrr.  PULM: ctab, no inc wob ABD: soft, +bs, suprapubic area tender EXT: no edema SKIN: no acute rash BACK: no CVA pain

## 2014-08-24 NOTE — Patient Instructions (Signed)
Drink plenty of water and start the antibiotics today.  We'll contact you with your lab report.  Take care.   

## 2014-08-26 DIAGNOSIS — N3090 Cystitis, unspecified without hematuria: Secondary | ICD-10-CM | POA: Insufficient documentation

## 2014-08-26 NOTE — Assessment & Plan Note (Signed)
Nontoxic, urine culture, fluids, septra, f/u prn.  D/w pt.

## 2014-08-27 LAB — URINE CULTURE: Colony Count: 50000

## 2014-09-05 ENCOUNTER — Other Ambulatory Visit: Payer: Self-pay | Admitting: Family Medicine

## 2014-09-05 DIAGNOSIS — E538 Deficiency of other specified B group vitamins: Secondary | ICD-10-CM

## 2014-09-05 DIAGNOSIS — R739 Hyperglycemia, unspecified: Secondary | ICD-10-CM | POA: Insufficient documentation

## 2014-09-05 DIAGNOSIS — E119 Type 2 diabetes mellitus without complications: Secondary | ICD-10-CM

## 2014-09-11 ENCOUNTER — Encounter: Payer: Self-pay | Admitting: Family Medicine

## 2014-09-11 LAB — CHOLESTEROL, TOTAL
Cholesterol, Total: 221
HDL: 43 mg/dL (ref 35–70)
LDL (calc): 129
Triglycerides: 244

## 2014-09-18 ENCOUNTER — Other Ambulatory Visit (INDEPENDENT_AMBULATORY_CARE_PROVIDER_SITE_OTHER): Payer: BLUE CROSS/BLUE SHIELD

## 2014-09-18 DIAGNOSIS — E119 Type 2 diabetes mellitus without complications: Secondary | ICD-10-CM | POA: Diagnosis not present

## 2014-09-18 DIAGNOSIS — E538 Deficiency of other specified B group vitamins: Secondary | ICD-10-CM | POA: Diagnosis not present

## 2014-09-18 LAB — MICROALBUMIN / CREATININE URINE RATIO
Creatinine,U: 290.2 mg/dL
Microalb Creat Ratio: 0.5 mg/g (ref 0.0–30.0)
Microalb, Ur: 1.4 mg/dL (ref 0.0–1.9)

## 2014-09-18 LAB — COMPREHENSIVE METABOLIC PANEL
ALBUMIN: 4.7 g/dL (ref 3.5–5.2)
ALK PHOS: 39 U/L (ref 39–117)
ALT: 32 U/L (ref 0–35)
AST: 22 U/L (ref 0–37)
BUN: 19 mg/dL (ref 6–23)
CHLORIDE: 102 meq/L (ref 96–112)
CO2: 28 meq/L (ref 19–32)
Calcium: 10 mg/dL (ref 8.4–10.5)
Creatinine, Ser: 0.89 mg/dL (ref 0.40–1.20)
GFR: 71.05 mL/min (ref >60.00)
Glucose, Bld: 112 mg/dL — ABNORMAL HIGH (ref 70–99)
Potassium: 4.1 mEq/L (ref 3.5–5.1)
Sodium: 137 mEq/L (ref 135–145)
Total Bilirubin: 1.3 mg/dL — ABNORMAL HIGH (ref 0.2–1.2)
Total Protein: 7.3 g/dL (ref 6.0–8.3)

## 2014-09-18 LAB — VITAMIN B12: Vitamin B-12: 409 pg/mL (ref 211–911)

## 2014-09-18 LAB — HEMOGLOBIN A1C: HEMOGLOBIN A1C: 6.3 % (ref 4.6–6.5)

## 2014-10-02 ENCOUNTER — Ambulatory Visit (INDEPENDENT_AMBULATORY_CARE_PROVIDER_SITE_OTHER): Payer: BLUE CROSS/BLUE SHIELD | Admitting: Family Medicine

## 2014-10-02 ENCOUNTER — Encounter: Payer: Self-pay | Admitting: Family Medicine

## 2014-10-02 VITALS — BP 102/76 | HR 75 | Temp 98.5°F | Ht 68.0 in | Wt 193.5 lb

## 2014-10-02 DIAGNOSIS — G90A Postural orthostatic tachycardia syndrome (POTS): Secondary | ICD-10-CM

## 2014-10-02 DIAGNOSIS — R739 Hyperglycemia, unspecified: Secondary | ICD-10-CM

## 2014-10-02 DIAGNOSIS — I498 Other specified cardiac arrhythmias: Secondary | ICD-10-CM

## 2014-10-02 DIAGNOSIS — Z0000 Encounter for general adult medical examination without abnormal findings: Secondary | ICD-10-CM | POA: Diagnosis not present

## 2014-10-02 DIAGNOSIS — R000 Tachycardia, unspecified: Secondary | ICD-10-CM

## 2014-10-02 DIAGNOSIS — Z7189 Other specified counseling: Secondary | ICD-10-CM

## 2014-10-02 DIAGNOSIS — I951 Orthostatic hypotension: Secondary | ICD-10-CM

## 2014-10-02 MED ORDER — PROPRANOLOL HCL ER 80 MG PO CP24
80.0000 mg | ORAL_CAPSULE | Freq: Two times a day (BID) | ORAL | Status: DC | PRN
Start: 2014-10-02 — End: 2016-09-25

## 2014-10-02 NOTE — Progress Notes (Signed)
CPE- See plan.  Routine anticipatory guidance given to patient.  See health maintenance.  Tetanus 2008- done at outside hospital.   Flu encouraged PNA not due Shingles not due yet Colonoscopy- she has f/u pending about that (pre-procedure visit) Pap NA.  Mammogram- last done last summer.   DXA not due.  She will have f/u with GYN later this year.  Living will d/w pt.  Husband designated if patient were incapacitated.   Diet and exercise d/w pt.  Intentional weight loss.   Lipids prev done at [facility].    POTTs per [facility].  Overall improved with intentional weight loss.   Hyperglycemia- not DM2 at this point.  Working on diet.  Weight is down intentionally.    PMH and SH reviewed  Meds, vitals, and allergies reviewed.   ROS: See HPI.  Otherwise negative.    GEN: nad, alert and oriented HEENT: mucous membranes moist NECK: supple w/o LA CV: rrr. PULM: ctab, no inc wob ABD: soft, +bs EXT: no edema SKIN: no acute rash

## 2014-10-02 NOTE — Patient Instructions (Signed)
Take care. Keep working on [employer] and.  Glad to see you.  I'll await your GI report.

## 2014-10-03 DIAGNOSIS — Z7189 Other specified counseling: Secondary | ICD-10-CM | POA: Insufficient documentation

## 2014-10-03 NOTE — Assessment & Plan Note (Signed)
POTTs per [facility]. Overall improved with intentional weight loss.

## 2014-10-03 NOTE — Assessment & Plan Note (Signed)
Tetanus 2008- done at outside hospital.  Flu encouraged  PNA not due  Shingles not due yet  Colonoscopy- she has f/u pending about that (pre-procedure visit)  Pap NA.  Mammogram- last done last summer.  DXA not due.  She will have f/u with GYN later this year.  Living will d/w pt. Husband designated if patient were incapacitated.  Diet and exercise d/w pt. Intentional weight loss.  Lipids prev done at [facility].   Plan is to continue with weight loss.

## 2014-10-03 NOTE — Assessment & Plan Note (Signed)
Hyperglycemia- not DM2 at this point. Working on diet. Weight is down intentionally.

## 2014-11-05 ENCOUNTER — Ambulatory Visit: Payer: BLUE CROSS/BLUE SHIELD | Admitting: Anesthesiology

## 2014-11-05 ENCOUNTER — Ambulatory Visit
Admission: RE | Admit: 2014-11-05 | Discharge: 2014-11-05 | Disposition: A | Discharge status: Home / Self Care | Payer: BLUE CROSS/BLUE SHIELD | Source: Ambulatory Visit | Attending: Gastroenterology | Admitting: Gastroenterology

## 2014-11-05 ENCOUNTER — Encounter: Payer: Self-pay | Admitting: Anesthesiology

## 2014-11-05 ENCOUNTER — Encounter
Admission: RE | Disposition: A | Discharge status: Home / Self Care | Payer: Self-pay | Source: Ambulatory Visit | Attending: Gastroenterology

## 2014-11-05 DIAGNOSIS — Z1211 Encounter for screening for malignant neoplasm of colon: Secondary | ICD-10-CM | POA: Insufficient documentation

## 2014-11-05 DIAGNOSIS — E119 Type 2 diabetes mellitus without complications: Secondary | ICD-10-CM | POA: Insufficient documentation

## 2014-11-05 DIAGNOSIS — I10 Essential (primary) hypertension: Secondary | ICD-10-CM | POA: Insufficient documentation

## 2014-11-05 DIAGNOSIS — D124 Benign neoplasm of descending colon: Secondary | ICD-10-CM | POA: Insufficient documentation

## 2014-11-05 DIAGNOSIS — E785 Hyperlipidemia, unspecified: Secondary | ICD-10-CM | POA: Insufficient documentation

## 2014-11-05 DIAGNOSIS — Z8371 Family history of colonic polyps: Secondary | ICD-10-CM | POA: Insufficient documentation

## 2014-11-05 DIAGNOSIS — D123 Benign neoplasm of transverse colon: Secondary | ICD-10-CM | POA: Insufficient documentation

## 2014-11-05 HISTORY — DX: Unspecified convulsions: R56.9

## 2014-11-05 HISTORY — DX: Other specified cardiac arrhythmias: I49.8

## 2014-11-05 HISTORY — DX: Orthostatic hypotension: I95.1

## 2014-11-05 HISTORY — PX: COLONOSCOPY WITH PROPOFOL: SHX5780

## 2014-11-05 HISTORY — DX: Tachycardia, unspecified: R00.0

## 2014-11-05 HISTORY — DX: Fatty (change of) liver, not elsewhere classified: K76.0

## 2014-11-05 HISTORY — DX: Postural orthostatic tachycardia syndrome (POTS): G90.A

## 2014-11-05 LAB — PROTIME-INR
INR: 0.98
Prothrombin Time: 13.2 seconds (ref 11.4–15.0)

## 2014-11-05 LAB — HM COLONOSCOPY

## 2014-11-05 LAB — PLATELET COUNT: Platelets: 258 10*3/uL (ref 150–440)

## 2014-11-05 LAB — GLUCOSE, CAPILLARY: Glucose-Capillary: 99 mg/dL (ref 65–99)

## 2014-11-05 SURGERY — COLONOSCOPY WITH PROPOFOL
Anesthesia: General

## 2014-11-05 MED ORDER — PHENYLEPHRINE HCL 10 MG/ML IJ SOLN
INTRAMUSCULAR | Status: DC | PRN
  Administered 2014-11-05: 100 ug via INTRAVENOUS

## 2014-11-05 MED ORDER — LIDOCAINE HCL (CARDIAC) 20 MG/ML IV SOLN
INTRAVENOUS | Status: DC | PRN
  Administered 2014-11-05: 60 mg via INTRAVENOUS

## 2014-11-05 MED ORDER — SODIUM CHLORIDE 0.9 % IV SOLN: INTRAVENOUS | Status: DC

## 2014-11-05 MED ORDER — MIDAZOLAM HCL 2 MG/2ML IJ SOLN
INTRAMUSCULAR | Status: DC | PRN
  Administered 2014-11-05: 1 mg via INTRAVENOUS

## 2014-11-05 MED ORDER — PROPOFOL INFUSION 10 MG/ML OPTIME
INTRAVENOUS | Status: DC | PRN
  Administered 2014-11-05: 140 ug/kg/min via INTRAVENOUS

## 2014-11-05 MED ORDER — SODIUM CHLORIDE 0.9 % IV SOLN
INTRAVENOUS | Status: DC
  Administered 2014-11-05: 16:00:00 via INTRAVENOUS

## 2014-11-05 MED ORDER — PROPOFOL 10 MG/ML IV BOLUS
INTRAVENOUS | Status: DC | PRN
  Administered 2014-11-05: 25 mg via INTRAVENOUS
  Administered 2014-11-05: 20 mg via INTRAVENOUS

## 2014-11-05 NOTE — H&P (Addendum)
Outpatient short stay form Pre-procedure 11/05/2014 4:18 PM Lollie Sails MD  Primary Physician: Dr. Elsie Stain  Reason for visit:  Colonoscopy  History of present illness:  Family history of colon polyps and a primary relative patient is a 49 year old female presenting today for colonoscopy. She last had a colonoscopy at [facility]. At that time was in follow-up of diverticulitis and a apparent self-limited right [place] that responded anti-biotics. She was having abdominal pain left lower quadrant.    Current facility-administered medications:  .  0.9 %  sodium chloride infusion, , Intravenous, Continuous, Lollie Sails, MD .  0.9 %  sodium chloride infusion, , Intravenous, Continuous, Lollie Sails, MD .  0.9 %  sodium chloride infusion, , Intravenous, Continuous, Lollie Sails, MD .  0.9 %  sodium chloride infusion, , Intravenous, Continuous, Lollie Sails, MD  Prescriptions prior to admission  Medication Sig Dispense Refill Last Dose  . Cyanocobalamin (B-12 COMPLIANCE INJECTION) 1000 MCG/ML KIT Inject 1,000 mcg as directed every 30 (thirty) days.   Past Week at Unknown time  . hydrochlorothiazide (HYDRODIURIL) 25 MG tablet Take 1 tablet (25 mg total) by mouth daily as needed. * Needs office visit for additional refills* 90 tablet 0 Past Week at Unknown time  . Multiple Vitamin (MULTIVITAMIN) tablet Take 1 tablet by mouth daily.     Past Week at Unknown time  . ONE TOUCH ULTRA TEST test strip USE ONE TWICE DAILY  200 each 0 Past Week at Unknown time  . ONETOUCH DELICA LANCETS FINE MISC check blood sugar twice daily and as directed  200 each 0 11/05/2014 at 800am  . propranolol (INDERAL) 20 MG tablet Take 20 mg by mouth as needed.    Past Week at Unknown time  . propranolol ER (INDERAL LA) 80 MG 24 hr capsule Take 1 capsule (80 mg total) by mouth 2 (two) times daily as needed.   11/05/2014 at 830am     Allergies  Allergen Reactions  . Nitrofurantoin Rash  .  Advair Diskus [Fluticasone-Salmeterol]     Intolerant- palpitations.   . Barbiturates     Hallucinations  . Cefuroxime Axetil     REACTION: GI upset  . Clarithromycin     REACTION: nausea  . Codeine   . Demerol [Meperidine] Nausea Only  . Food     [place]  . Oxybutynin     REACTION: fluid retention, headaches     Past Medical History  Diagnosis Date  . GERD (gastroesophageal reflux disease)   . Hyperlipidemia   . RLS (restless legs syndrome)   . Compression fracture of cervical vertebra     C5-6  . Treadmill stress test negative for angina pectoris 01/2000  . Mononucleosis 2005  . B12 deficiency   . Diverticulitis     04/2008  . Hypertension   . Pneumonia     HX PNEUMONIA   . Diabetes mellitus     DIET CONTROLLED  . Kidney stones   . Neuromuscular disorder     DR LOVE HAD 1 EPISODE 2010 INVOL MOVEMENT  LT ARM   . NASH (nonalcoholic steatohepatitis)   . PONV (postoperative nausea and vomiting)     1994 HYST  WOKE UP    . Dysrhythmia     POTS (postural orthostatic tachycardia syndrome)  . Thyroid nodule   . Hyperthyroidism     f/u testing neg  . Seizures   . Sleep apnea   . Steatosis of liver   .  Fatty liver   . POTS (postural orthostatic tachycardia syndrome)   . Pneumonia     Review of systems:      Physical Exam    Heart and lungs: Regular rate and rhythm without rub or gallop lungs are bilaterally clear    HEENT: Normocephalic atraumatic eyes are anicteric    Other:     Pertinant exam for procedure: Soft nontender nondistended bowel sounds positive normoactive    Planned proceedures: Colonoscopy and indicated procedures I have discussed the risks benefits and complications of procedures to include not limited to bleeding, infection, perforation and the risk of sedation and the patient wishes to proceed.   Labs obtained today included a platelet count and pro time with INR. The platelet count was 258. The pro time was 13.2 with an INR of  0.98. Lollie Sails, MD Gastroenterology 11/05/2014  4:18 PM

## 2014-11-05 NOTE — Op Note (Signed)
Hospital Gastroenterology Patient Name: [name] Procedure Date: 11/05/2014 4:17 PM MRN: [id number] Account #: [id number] Date of Birth: October 06, 1965 Admit Type: Outpatient Age: 49 Room: [facility] ENDO ROOM 3 Gender: Female Note Status: Finalized Procedure:         Colonoscopy Indications:       Colon cancer screening in patient at increased risk:                     Family history of colon polyps Providers:         Lollie Sails, MD Referring MD:      Elveria Rising. Damita Dunnings (Referring MD) Medicines:         Monitored Anesthesia Care Complications:     No immediate complications. Procedure:         Pre-Anesthesia Assessment:                    - ASA Grade Assessment: III - A patient with severe                     systemic disease.                    After obtaining informed consent, the colonoscope was                     passed under direct vision. Throughout the procedure, the                     patient's blood pressure, pulse, and oxygen saturations                     were monitored continuously. The Colonoscope was                     introduced through the anus and advanced to the the cecum,                     identified by appendiceal orifice and ileocecal valve. The                     colonoscopy was performed with moderate difficulty due to                     significant looping. Successful completion of the                     procedure was aided by using manual pressure. Findings:      A scattered area of mildly congested and erythematous mucosa was found       in the rectum and in the sigmoid colon. Biopsies were taken with a cold       forceps for histology, right colon, left colon and rectum, placed into       separate containers.      A 2 mm polyp was found in the transverse colon. The polyp was sessile.       The polyp was removed with a cold biopsy forceps. Resection and       retrieval were complete.      The digital rectal exam was  normal. Impression:        - Congested and erythematous mucosa in the rectum and in                     the sigmoid  colon. Biopsied.                    - One 2 mm polyp in the transverse colon. Resected and                     retrieved. Recommendation:    - Await pathology results.                    - Telephone GI clinic for pathology results in 1 week. Procedure Code(s): --- Professional ---                    [phone], Colonoscopy, flexible; with biopsy, single or                     multiple Diagnosis Code(s): --- Professional ---                    V18.51, Family history of colonic polyps                    569.49, Other specified disorders of rectum and anus                    569.89, Other specified disorders of intestine                    211.3, Benign neoplasm of colon CPT copyright 2014 American Medical Association. All rights reserved. The codes documented in this report are preliminary and upon coder review may  be revised to meet current compliance requirements. Lollie Sails, MD 11/05/2014 5:12:53 PM This report has been signed electronically. Number of Addenda: 0 Note Initiated On: 11/05/2014 4:17 PM Scope Withdrawal Time: 0 hours 12 minutes 17 seconds  Total Procedure Duration: 0 hours 27 minutes 44 seconds       [facility]

## 2014-11-05 NOTE — Anesthesia Postprocedure Evaluation (Signed)
  Anesthesia Post-op Note  Patient: [name]  Procedure(s) Performed: Procedure(s): COLONOSCOPY WITH PROPOFOL (N/A)  Anesthesia type:General  Patient location: PACU  Post pain: Pain level controlled  Post assessment: Post-op Vital signs reviewed, Patient's Cardiovascular Status Stable, Respiratory Function Stable, Patent Airway and No signs of Nausea or vomiting  Post vital signs: Reviewed and stable  Last Vitals:  Filed Vitals:   11/05/14 1748  BP: 107/86  Pulse: 58  Temp:   Resp: 18    Level of consciousness: awake, alert  and patient cooperative  Complications: No apparent anesthesia complications

## 2014-11-05 NOTE — Anesthesia Preprocedure Evaluation (Signed)
Anesthesia Evaluation  Patient identified by MRN, date of birth, ID band Patient awake    Reviewed: Allergy & Precautions, H&P , NPO status , Patient's Chart, lab work & pertinent test results, reviewed documented beta blocker date and time   History of Anesthesia Complications (+) PONV, AWARENESS UNDER ANESTHESIA and history of anesthetic complications  Airway Mallampati: II  TM Distance: >3 FB Neck ROM: full    Dental no notable dental hx. (+) Teeth Intact   Pulmonary sleep apnea , resolved,  breath sounds clear to auscultation  Pulmonary exam normal       Cardiovascular Exercise Tolerance: Good hypertension, Normal cardiovascular exam+ dysrhythmias Rhythm:regular Rate:Normal     Neuro/Psych  Headaches, PSYCHIATRIC DISORDERS  Neuromuscular disease negative neurological ROS     GI/Hepatic GERD-  Controlled,(+) Hepatitis -  Endo/Other  diabetesHyperthyroidism   Renal/GU Renal disease  negative genitourinary   Musculoskeletal   Abdominal   Peds  Hematology negative hematology ROS (+)   Anesthesia Other Findings Past Medical History:   GERD (gastroesophageal reflux disease)                       Hyperlipidemia                                               RLS (restless legs syndrome)                                 Compression fracture of cervical vertebra                      Comment:C5-6   Treadmill stress test negative for angina pect* 01/2000      Mononucleosis                                   2005         B12 deficiency                                               Diverticulitis                                                 Comment:04/2008   Hypertension                                                 Pneumonia                                                      Comment:HX PNEUMONIA    Diabetes mellitus  Comment:DIET CONTROLLED   Kidney stones                                                 Neuromuscular disorder                                         Comment:DR LOVE HAD 1 EPISODE 2010 INVOL MOVEMENT  LT               ARM    NASH (nonalcoholic steatohepatitis)                          PONV (postoperative nausea and vomiting)                       Comment:1994 HYST  WOKE UP     Dysrhythmia                                                    Comment:POTS (postural orthostatic tachycardia               syndrome)   Thyroid nodule                                               Hyperthyroidism                                                Comment:f/u testing neg   Seizures                                                     Sleep apnea                                                  Steatosis of liver                                           Fatty liver                                                  POTS (postural orthostatic tachycardia syndrom*              Pneumonia  Reproductive/Obstetrics negative OB ROS                             Anesthesia Physical Anesthesia Plan  ASA: III  Anesthesia Plan: General   Post-op Pain Management:    Induction:   Airway Management Planned:   Additional Equipment:   Intra-op Plan:   Post-operative Plan:   Informed Consent: I have reviewed the patients History and Physical, chart, labs and discussed the procedure including the risks, benefits and alternatives for the proposed anesthesia with the patient or authorized representative who has indicated his/her understanding and acceptance.   Dental Advisory Given  Plan Discussed with: Anesthesiologist, CRNA and Surgeon  Anesthesia Plan Comments:         Anesthesia Quick Evaluation

## 2014-11-05 NOTE — Transfer of Care (Signed)
Immediate Anesthesia Transfer of Care Note  Patient: [name]  Procedure(s) Performed: Procedure(s): COLONOSCOPY WITH PROPOFOL (N/A)  Patient Location: PACU  Anesthesia Type:General  Level of Consciousness: awake, alert  and oriented  Airway & Oxygen Therapy: Patient Spontanous Breathing and Patient connected to nasal cannula oxygen  Post-op Assessment: Report given to RN and Post -op Vital signs reviewed and stable  Post vital signs: Reviewed and stable  Last Vitals:  Filed Vitals:   11/05/14 1718  BP: 104/78  Pulse: 73  Temp: 36 C  Resp:     Complications: No apparent anesthesia complications

## 2014-11-06 ENCOUNTER — Encounter: Payer: Self-pay | Admitting: Gastroenterology

## 2014-11-07 ENCOUNTER — Ambulatory Visit (INDEPENDENT_AMBULATORY_CARE_PROVIDER_SITE_OTHER): Payer: BLUE CROSS/BLUE SHIELD | Admitting: Family Medicine

## 2014-11-07 ENCOUNTER — Encounter: Payer: Self-pay | Admitting: Family Medicine

## 2014-11-07 ENCOUNTER — Telehealth: Payer: Self-pay

## 2014-11-07 VITALS — BP 100/66 | HR 76 | Temp 98.4°F | Wt 189.2 lb

## 2014-11-07 DIAGNOSIS — N3001 Acute cystitis with hematuria: Secondary | ICD-10-CM

## 2014-11-07 DIAGNOSIS — N390 Urinary tract infection, site not specified: Secondary | ICD-10-CM | POA: Insufficient documentation

## 2014-11-07 DIAGNOSIS — R319 Hematuria, unspecified: Secondary | ICD-10-CM | POA: Diagnosis not present

## 2014-11-07 LAB — POCT URINALYSIS DIPSTICK
PH UA: 6
Protein, UA: NEGATIVE
SPEC GRAV UA: 1.015
UROBILINOGEN UA: 2

## 2014-11-07 MED ORDER — SULFAMETHOXAZOLE-TRIMETHOPRIM 800-160 MG PO TABS: 1.0000 | ORAL_TABLET | Freq: Two times a day (BID) | ORAL | Status: DC

## 2014-11-07 NOTE — Telephone Encounter (Signed)
Pt has appt 11/07/14 at 5:15 pm with Dr Damita Dunnings.

## 2014-11-07 NOTE — Telephone Encounter (Signed)
PLEASE NOTE: All timestamps contained within this report are represented as [place] Standard Time. CONFIDENTIALTY NOTICE: This fax transmission is intended only for the addressee. It contains information that is legally privileged, confidential or otherwise protected from use or disclosure. If you are not the intended recipient, you are strictly prohibited from reviewing, disclosing, copying using or disseminating any of this information or taking any action in reliance on or regarding this information. If you have received this fax in error, please notify us immediately by telephone so that we can arrange for its return to [place]. Phone: [phone], Toll-Free: [phone], Fax: [phone] Page: 1 of 2 Call Id: [id number] [place] Patient Name: [name] Gender: Female DOB: Jan 03, 1966 Age: 49 Y 2 M 7 D Return Phone Number: [phone] (Primary) Address: City/State/Zip:  Client [place] Night - Client Client Site [place] Physician Renford Dills Contact Type Call Call Type Triage / Victor Name Lynnae Sandhoff Relationship To Patient Spouse Return Phone Number [phone] (Primary) Chief Complaint Urine, Blood In Initial Comment Caller states his wife is having trouble urinating and has also had blood in her urine. PreDisposition Home Care Nurse Assessment Nurse: Solocinski, RN, Beth Date/Time Eilene Ghazi Time): 11/06/2014 7:33:23 PM Confirm and document reason for call. If symptomatic, describe symptoms. ---Caller states spouse states she burning upon urination and blood in urine. Bladder spasms. Symptoms just began this afternoon. Had colonoscopy yesterday. Fever unknown to patient. Caller wants to have something in. Caller has spoken to several other doctors today about this afternoon. Has the patient traveled out of the country  within the last 30 days? ---Yes Where have you traveled? ([place] for Ebola and Ebola guideline, [place], [place] for [place]) ---[place] Does the patient require triage? ---Yes Related visit to physician within the last 2 weeks? ---Yes Does the PT have any chronic conditions? (i.e. diabetes, asthma, etc.) ---Yes List chronic conditions. ---diabetic diet fatty liver borderline htn Did the patient indicate they were pregnant? ---No Guidelines Guideline Title Affirmed Question Affirmed Notes Nurse Date/Time (Eastern Time) Urine - Blood In Pain or burning with passing urine Solocinski, RN, [facility] 11/06/2014 7:33:52 PM Disp. Time Eilene Ghazi Time) Disposition Final User 11/06/2014 7:44:40 PM See Physician within 24 Hours Yes Solocinski, RN, Personal assistant Understands: Yes PLEASE NOTE: All timestamps contained within this report are represented as [place] Standard Time. CONFIDENTIALTY NOTICE: This fax transmission is intended only for the addressee. It contains information that is legally privileged, confidential or otherwise protected from use or disclosure. If you are not the intended recipient, you are strictly prohibited from reviewing, disclosing, copying using or disseminating any of this information or taking any action in reliance on or regarding this information. If you have received this fax in error, please notify us immediately by telephone so that we can arrange for its return to [place]. Phone: [phone], Toll-Free: [phone], Fax: [phone] Page: 2 of 2 Call Id: [id number] Disagree/Comply: Comply Care Advice Given Per Guideline SEE PHYSICIAN WITHIN 24 HOURS: CALL BACK IF: * Fever occurs * You become worse. After Care Instructions Given Call Event Type User Date / Time Description Referrals REFERRED TO PCP OFFICE

## 2014-11-07 NOTE — Assessment & Plan Note (Signed)
Nontoxic, septra, ucx, fluids, f/u prn.  See AVS.

## 2014-11-07 NOTE — Patient Instructions (Signed)
Drink plenty of water and start the antibiotics today.  We'll contact you with your lab report.  Take care.   

## 2014-11-07 NOTE — Telephone Encounter (Signed)
Will see today.  

## 2014-11-07 NOTE — Progress Notes (Signed)
Pre visit review using our clinic review tool, if applicable. No additional management support is needed unless otherwise documented below in the visit note.  dysuria:yes duration of symptoms: 1 day abdominal pain: cramping in abd fevers:no back pain:no vomiting:no Recently with colonoscopy done.  ucx pending.    Meds, vitals, and allergies reviewed.   ROS: See HPI.  Otherwise negative.    GEN: nad, alert and oriented NECK: supple CV: rrr.  PULM: ctab, no inc wob ABD: soft, +bs, suprapubic area tender EXT: no edema SKIN: no acute rash BACK: no CVA pain

## 2014-11-08 LAB — SURGICAL PATHOLOGY

## 2014-11-10 LAB — URINE CULTURE: Colony Count: 50000

## 2014-11-15 ENCOUNTER — Encounter: Payer: Self-pay | Admitting: Family Medicine

## 2015-02-21 ENCOUNTER — Other Ambulatory Visit (HOSPITAL_COMMUNITY): Payer: Self-pay | Admitting: Urology

## 2015-02-21 DIAGNOSIS — N289 Disorder of kidney and ureter, unspecified: Secondary | ICD-10-CM

## 2015-02-28 ENCOUNTER — Other Ambulatory Visit: Payer: Self-pay | Admitting: Obstetrics and Gynecology

## 2015-02-28 DIAGNOSIS — R52 Pain, unspecified: Secondary | ICD-10-CM

## 2015-03-01 ENCOUNTER — Ambulatory Visit
Admission: RE | Admit: 2015-03-01 | Discharge: 2015-03-01 | Disposition: A | Discharge status: Home / Self Care | Payer: BLUE CROSS/BLUE SHIELD | Source: Ambulatory Visit | Attending: Obstetrics and Gynecology | Admitting: Obstetrics and Gynecology

## 2015-03-01 ENCOUNTER — Other Ambulatory Visit: Payer: Self-pay | Admitting: Obstetrics and Gynecology

## 2015-03-01 DIAGNOSIS — M25519 Pain in unspecified shoulder: Secondary | ICD-10-CM

## 2015-03-11 ENCOUNTER — Ambulatory Visit (HOSPITAL_COMMUNITY)
Admission: RE | Admit: 2015-03-11 | Discharge: 2015-03-11 | Disposition: A | Discharge status: Home / Self Care | Payer: BLUE CROSS/BLUE SHIELD | Source: Ambulatory Visit | Attending: Urology | Admitting: Urology

## 2015-03-11 DIAGNOSIS — K760 Fatty (change of) liver, not elsewhere classified: Secondary | ICD-10-CM | POA: Diagnosis not present

## 2015-03-11 DIAGNOSIS — N289 Disorder of kidney and ureter, unspecified: Secondary | ICD-10-CM | POA: Insufficient documentation

## 2015-03-11 DIAGNOSIS — N281 Cyst of kidney, acquired: Secondary | ICD-10-CM | POA: Insufficient documentation

## 2015-03-11 LAB — POCT I-STAT CREATININE: Creatinine, Ser: 0.9 mg/dL (ref 0.44–1.00)

## 2015-03-11 MED ORDER — GADOBENATE DIMEGLUMINE 529 MG/ML IV SOLN
20.0000 mL | Freq: Once | INTRAVENOUS | Status: AC | PRN
Start: 2015-03-11 — End: 2015-03-11
  Administered 2015-03-11: 18 mL via INTRAVENOUS

## 2015-03-12 ENCOUNTER — Ambulatory Visit
Admission: RE | Admit: 2015-03-12 | Discharge: 2015-03-12 | Disposition: A | Discharge status: Home / Self Care | Payer: BLUE CROSS/BLUE SHIELD | Source: Ambulatory Visit | Attending: Obstetrics and Gynecology | Admitting: Obstetrics and Gynecology

## 2015-03-12 DIAGNOSIS — R52 Pain, unspecified: Secondary | ICD-10-CM

## 2015-06-11 DIAGNOSIS — S069XAA Unspecified intracranial injury with loss of consciousness status unknown, initial encounter: Secondary | ICD-10-CM | POA: Insufficient documentation

## 2015-10-09 ENCOUNTER — Ambulatory Visit (INDEPENDENT_AMBULATORY_CARE_PROVIDER_SITE_OTHER): Payer: BLUE CROSS/BLUE SHIELD | Admitting: Primary Care

## 2015-10-09 ENCOUNTER — Encounter: Payer: Self-pay | Admitting: Primary Care

## 2015-10-09 VITALS — BP 110/70 | HR 67 | Temp 98.1°F | Ht 68.0 in | Wt 194.4 lb

## 2015-10-09 DIAGNOSIS — R319 Hematuria, unspecified: Secondary | ICD-10-CM

## 2015-10-09 LAB — POC URINALSYSI DIPSTICK (AUTOMATED)
Bilirubin, UA: NEGATIVE
Glucose, UA: NEGATIVE
KETONES UA: NEGATIVE
Leukocytes, UA: NEGATIVE
Nitrite, UA: NEGATIVE
PH UA: 6
PROTEIN UA: NEGATIVE
Urobilinogen, UA: NEGATIVE

## 2015-10-09 NOTE — Progress Notes (Signed)
Subjective:    Patient ID: [name], female    DOB: 1965/05/12, 50 y.o.   MRN: [id number]  HPI  [name] is a 50 year old female who presents today with a chief complaint of hematuria. She also reports dysuria, increased frequency, urinary urgency. Her symptoms began around the 13th/14th of June while she was in [place]. She tried taking AZO with some improvement. Her symptoms then began again several days later while in [place]. She took several Amoxicillin tablets her daughter had from a prior prescription with temporary improvement. Symptoms have re-occurred last night with hematuria.  Denies fevers, vaginal discharge, vaginal itching. She has family history of bladder cancer in her father. She under went a scope per her Urologist 1 year ago which was unremarkable. She also has a history of POTS and will become dehydrated quickly.  Review of Systems  Constitutional: Negative for fever.  Gastrointestinal: Negative for abdominal pain.  Genitourinary: Positive for dysuria, urgency, frequency and hematuria. Negative for flank pain and vaginal discharge.       Past Medical History  Diagnosis Date  . GERD (gastroesophageal reflux disease)   . Hyperlipidemia   . RLS (restless legs syndrome)   . Compression fracture of cervical vertebra (HCC)     C5-6  . Treadmill stress test negative for angina pectoris 01/2000  . Mononucleosis 2005  . B12 deficiency   . Diverticulitis     04/2008  . Hypertension   . Pneumonia     HX PNEUMONIA   . Diabetes mellitus     DIET CONTROLLED  . Kidney stones   . Neuromuscular disorder ([place])     DR LOVE HAD 1 EPISODE 2010 INVOL MOVEMENT  LT ARM   . NASH (nonalcoholic steatohepatitis)   . PONV (postoperative nausea and vomiting)     1994 HYST  WOKE UP    . Dysrhythmia     POTS (postural orthostatic tachycardia syndrome)  . Thyroid nodule   . Hyperthyroidism     f/u testing neg  . Seizures ([place])   . Sleep apnea   . Steatosis of liver   .  Fatty liver   . POTS (postural orthostatic tachycardia syndrome)   . Pneumonia      Social History   Social History  . Marital Status: Married    Spouse Name: N/A  . Number of Children: 2  . Years of Education: N/A   Occupational History  .    [employer]     Social History Main Topics  . Smoking status: Never Smoker   . Smokeless tobacco: Never Used  . Alcohol Use: No  . Drug Use: No  . Sexual Activity: Not on file   Other Topics Concern  . Not on file   Social History Narrative   Married.     Past Surgical History  Procedure Laterality Date  . Abdominal hysterectomy    . Bladder repair  2003  . Esophagogastroduodenoscopy  03/2000    Negative  . Stress cardiolite  12/2002    Negative  . Cardiovascular stress test  07/2006    Negative, (equivocal), increased uptake of radiotracer mid back  . US abdominal  08/2003 & 01/2005    Fatty liver (both times)  . Appendectomy    . Cardiovascular stress test      11/2010, no ischemia  . Doppler echocardiography      small effusion, but normal o/w  . Tonsillectomy    . Pubic vaginal sling  2002, 2006  . Cardiac catheterization      12/2010 with Dr. Gwenlyn Found, normal  . Cholecystectomy      04/14/12   IN [facility]  . Lumbar laminectomy/decompression microdiscectomy  04/20/2012    Procedure: LUMBAR LAMINECTOMY/DECOMPRESSION MICRODISCECTOMY 1 LEVEL;  Surgeon: Winfield Cunas, MD;  Location: [facility];  Service: Neurosurgery;  Laterality: Left;  LEFT Lumbar five-Sacral one diskectomy  . Tonsillectomy    . Colonoscopy with propofol N/A 11/05/2014    Procedure: COLONOSCOPY WITH PROPOFOL;  Surgeon: Lollie Sails, MD;  Location: [facility] ENDOSCOPY;  Service: Endoscopy;  Laterality: N/A;    Family History  Problem Relation Age of Onset  . Stroke Mother   . Heart disease Father     2 MI's  . Bladder Cancer Father   . Diabetes Other 80  . Cancer Other   . Hypertension Other   . Colon cancer Neg Hx   . Breast cancer Neg Hx       Allergies  Allergen Reactions  . Nitrofurantoin Rash  . Advair Diskus [Fluticasone-Salmeterol]     Intolerant- palpitations.   . Barbiturates     Hallucinations  . Cefuroxime Axetil     REACTION: GI upset  . Clarithromycin     REACTION: nausea  . Codeine   . Demerol [Meperidine] Nausea Only  . Food     [place]  . Oxybutynin     REACTION: fluid retention, headaches    Current Outpatient Prescriptions on File Prior to Visit  Medication Sig Dispense Refill  . hydrochlorothiazide (HYDRODIURIL) 25 MG tablet Take 1 tablet (25 mg total) by mouth daily as needed. * Needs office visit for additional refills* 90 tablet 0  . Multiple Vitamin (MULTIVITAMIN) tablet Take 1 tablet by mouth daily.      . ONE TOUCH ULTRA TEST test strip USE ONE TWICE DAILY  200 each 0  . ONETOUCH DELICA LANCETS FINE MISC check blood sugar twice daily and as directed  200 each 0  . Phenazopyridine HCl (AZO TABS PO) Take by mouth.    . propranolol (INDERAL) 20 MG tablet Take 20 mg by mouth as needed.     . propranolol ER (INDERAL LA) 80 MG 24 hr capsule Take 1 capsule (80 mg total) by mouth 2 (two) times daily as needed.     No current facility-administered medications on file prior to visit.    BP 110/70 mmHg  Pulse 67  Temp(Src) 98.1 F (36.7 C) (Oral)  Ht 5' 8"  (1.727 m)  Wt 194 lb 6.4 oz (88.179 kg)  BMI 29.57 kg/m2  SpO2 98%    Objective:   Physical Exam  Constitutional: She appears well-nourished.  Cardiovascular: Normal rate and regular rhythm.   Pulmonary/Chest: Effort normal and breath sounds normal.  Abdominal: Soft. Bowel sounds are normal. There is tenderness in the suprapubic area.  Skin: Skin is warm and dry.          Assessment & Plan:  Hematuria:  Intermittently present for 2 weeks, most recently occurred last night. Also with dysuria, urgency, frequency.  Family history of bladder cancer in father, has urologist with recent unremarkable scope last Fall.  Hysterectomy. UA: 1+ blood, otherwise unremarkable. Will send culture to ensure no bacterial involvement. Dicussed that if culture was negative, would like for her to undergo evaluation per Urology. Continue fluids, AZO PRN.  Sheral Flow, NP

## 2015-10-09 NOTE — Progress Notes (Signed)
Pre visit review using our clinic review tool, if applicable. No additional management support is needed unless otherwise documented below in the visit note. 

## 2015-10-09 NOTE — Addendum Note (Signed)
Addended by: Jacqualin Combes on: 10/09/2015 11:52 AM   Modules accepted: Orders, SmartSet

## 2015-10-09 NOTE — Patient Instructions (Signed)
Your urine test today does not indicate infection, however, you do have blood in your urine.  I will send a culture of your urine which should return by Friday this week. If your culture is negative then I recommend re-evaluation by your Urologist.  Continue to stay hydrated with water. You may take additional AZO as discussed.  It was a pleasure meeting you!

## 2015-10-11 ENCOUNTER — Encounter: Payer: Self-pay | Admitting: Primary Care

## 2015-10-11 LAB — URINE CULTURE: Colony Count: 5000

## 2015-10-19 ENCOUNTER — Encounter: Payer: Self-pay | Admitting: Family Medicine

## 2015-10-19 DIAGNOSIS — G4733 Obstructive sleep apnea (adult) (pediatric): Secondary | ICD-10-CM | POA: Insufficient documentation

## 2016-01-14 ENCOUNTER — Ambulatory Visit (INDEPENDENT_AMBULATORY_CARE_PROVIDER_SITE_OTHER): Payer: BLUE CROSS/BLUE SHIELD | Admitting: Sports Medicine

## 2016-01-14 DIAGNOSIS — M25562 Pain in left knee: Secondary | ICD-10-CM | POA: Diagnosis not present

## 2016-01-14 DIAGNOSIS — M25561 Pain in right knee: Secondary | ICD-10-CM | POA: Diagnosis not present

## 2016-01-14 DIAGNOSIS — M25551 Pain in right hip: Secondary | ICD-10-CM

## 2016-01-14 DIAGNOSIS — M25511 Pain in right shoulder: Secondary | ICD-10-CM

## 2016-01-27 ENCOUNTER — Encounter: Payer: Self-pay | Admitting: Family Medicine

## 2016-01-27 ENCOUNTER — Ambulatory Visit (INDEPENDENT_AMBULATORY_CARE_PROVIDER_SITE_OTHER): Payer: BLUE CROSS/BLUE SHIELD | Admitting: Family Medicine

## 2016-01-27 DIAGNOSIS — F411 Generalized anxiety disorder: Secondary | ICD-10-CM

## 2016-01-27 DIAGNOSIS — H6983 Other specified disorders of Eustachian tube, bilateral: Secondary | ICD-10-CM | POA: Diagnosis not present

## 2016-01-27 DIAGNOSIS — H6980 Other specified disorders of Eustachian tube, unspecified ear: Secondary | ICD-10-CM | POA: Insufficient documentation
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
## 2016-01-27 NOTE — Patient Instructions (Addendum)
[facility] Phone: [phone]  [address], [place], [place]  [address]  I don't think you'll need a referral.  Update me if you do need one.   Nasal saline, gently try to pop your ears.  Use flonase if needed.  If not better, then we can get you set up with ENT.   Take care.  Glad to see you.

## 2016-01-27 NOTE — Assessment & Plan Note (Signed)
>  25 minutes spent in face to face time with patient, >50% spent in counselling or coordination of care.  D/w pt about counseling and she'll call about an appointment.

## 2016-01-27 NOTE — Progress Notes (Signed)
Pre visit review using our clinic review tool, if applicable. No additional management support is needed unless otherwise documented below in the visit note. 

## 2016-01-28 NOTE — Assessment & Plan Note (Signed)
Discussed with patient about nasal saline, Valsalva, Flonase use. Should resolve. Update [place] as needed.

## 2016-02-14 ENCOUNTER — Ambulatory Visit (INDEPENDENT_AMBULATORY_CARE_PROVIDER_SITE_OTHER): Payer: BLUE CROSS/BLUE SHIELD | Admitting: Sports Medicine

## 2016-02-14 ENCOUNTER — Encounter (INDEPENDENT_AMBULATORY_CARE_PROVIDER_SITE_OTHER): Payer: Self-pay | Admitting: Sports Medicine

## 2016-02-14 ENCOUNTER — Ambulatory Visit (INDEPENDENT_AMBULATORY_CARE_PROVIDER_SITE_OTHER): Payer: BLUE CROSS/BLUE SHIELD

## 2016-02-14 VITALS — BP 113/74 | HR 74 | Ht 68.0 in | Wt 198.0 lb

## 2016-02-14 DIAGNOSIS — M25562 Pain in left knee: Secondary | ICD-10-CM | POA: Diagnosis not present

## 2016-02-14 NOTE — Patient Instructions (Signed)
We are ordering an MRI for you today.  The imaging office will be calling you to schedule your appointment after we obtain authorization from your insurance company.  Immediately after you schedule your appointment with them please call our office back [phone]) to schedule a follow-up appointment with me.  This will need to be at least 24 hours after you have the test done to give radiologist and myself time to review the test.  We will discuss further treatment options at your follow up appointment.

## 2016-02-14 NOTE — Progress Notes (Signed)
[name] - 50 y.o. female MRN [id number]  Date of birth: 27-Nov-1965  Office Visit Note: Visit Date: 02/14/2016 PCP: Elsie Stain, MD Referred by: Tonia Ghent, MD  Subjective: Chief Complaint  Patient presents with  . Left Knee - Pain   HPI: Patient states still having pain in left knee.  Hurts to bend, straighten, and walk with left knee.  Thinks it may be swelling in left knee down to her left ankle.   This is significantly interfering with her ability to perform day-to-day activities as well as participate in the gym which is limiting her ability to return to work. Knee is significantly more painful today than it was last office visit. She is having occasional clicking & popping.    ROS Otherwise per HPI.  Assessment & Plan: Visit Diagnoses:  1. Acute pain of left knee     Plan: Findings:  Long discussion today. Given the persistent symptoms she is having a findings on exam today & concern for medial meniscal tear. She's had issues with core to steroids in the past & would like to avoid this if possible & would like further diagnostic information prior to proceeding. These symptoms have been present for greater than 6 months she is tried appropriate bracing with body helix compression sleeve as well as therapeutic exercises were physician guided for over 6 months.  MRI of the left knee to eval for worsening mechanical with concerns for medial joint line pain and likely meniscal tear Will consider injection at f/u    Meds & Orders: No orders of the defined types were placed in this encounter.   Orders Placed This Encounter  Procedures  . Large Joint Injection/Arthrocentesis  . XR KNEE 3 VIEW LEFT  . MR Knee Left w/ contrast    Follow-up: Return for MRI review.   Procedures: No procedures performed  No notes on file   Clinical History: No specialty comments available.  She reports that she has never smoked. She has never used smokeless tobacco. No results for  input(s): HGBA1C, LABURIC in the last 8760 hours.  Objective:  VS:  HT:5' 8"  (172.7 cm)   WT:198 lb (89.8 kg)  BMI:30.2    BP:113/74  HR:74bpm  TEMP: ( )  RESP:  Physical Exam  Constitutional: She appears well-developed and well-nourished. No distress.  HENT:  Head: Normocephalic and atraumatic.  Pulmonary/Chest: Effort normal. No respiratory distress.  Musculoskeletal:       Left knee: She exhibits effusion.  Neurological: She is alert.  Appropriately interactive.  Skin: Skin is warm and dry. No rash noted. She is not diaphoretic. No erythema. No pallor.  Psychiatric: She has a normal mood and affect. Her behavior is normal. Judgment and thought content normal.    Left Knee Exam   Other  Erythema: absent Scars: absent Sensation: normal Pulse: present Swelling: none Effusion: effusion present  Comments:  Overall well aligned. She has full extension of the knee but does have a small effusion today. She is stable to varus & valgus strain but painful with valgus testing. She has pain with McMurray's and a palpable click over the medial joint line.. Unable to perform [facility].     Imaging: Xr Knee 3 View Left  Result Date: 02/18/2016 X-rays are overall well aligned. No significant degenerative change. Normal x-ray   Past Medical/Family/Surgical/Social History: Medications & Allergies reviewed per EMR Patient Active Problem List   Diagnosis Date Noted  . Anxiety state 01/27/2016  . ETD (eustachian  tube dysfunction) 01/27/2016  . UTI (urinary tract infection) 11/07/2014  . Advance care planning 10/03/2014  . Hyperglycemia 09/05/2014  . Atypical chest pain 08/06/2014  . Cough 04/10/2013  . Routine general medical examination at a health care facility 03/30/2013  . Back pain 03/16/2012  . POTS (postural orthostatic tachycardia syndrome) 04/08/2011  . SOB (shortness of breath) on exertion 12/25/2010  . REACTION, ACUTE STRESS W/EMOTIONAL DSTURB 09/09/2006  . ESSENTIAL  HYPERTENSION 09/09/2006  . B12 deficiency 09/08/2006  . HYPERLIPIDEMIA 09/08/2006  . SLEEP APNEA, OBSTRUCTIVE, MILD 09/08/2006  . MIGRAINE HEADACHE 09/08/2006  . GERD 09/08/2006  . CHEST PAIN 09/08/2006  . URINARY INCONTINENCE, MIXED 09/08/2006   Past Medical History:  Diagnosis Date  . B12 deficiency   . Compression fracture of cervical vertebra (HCC)    C5-6  . Diabetes mellitus    DIET CONTROLLED  . Diverticulitis    04/2008  . Dysrhythmia    POTS (postural orthostatic tachycardia syndrome)  . Fatty liver   . GERD (gastroesophageal reflux disease)   . Hyperlipidemia   . Hypertension   . Hyperthyroidism    f/u testing neg  . Kidney stones   . Mononucleosis 2005  . NASH (nonalcoholic steatohepatitis)   . Neuromuscular disorder ([place])    DR LOVE HAD 1 EPISODE 2010 INVOL MOVEMENT  LT ARM   . Pneumonia    HX PNEUMONIA   . Pneumonia   . PONV (postoperative nausea and vomiting)    1994 HYST  WOKE UP    . POTS (postural orthostatic tachycardia syndrome)   . RLS (restless legs syndrome)   . Seizures ([place])   . Sleep apnea   . Steatosis of liver   . Thyroid nodule   . Treadmill stress test negative for angina pectoris 01/2000   Family History  Problem Relation Age of Onset  . Stroke Mother   . Heart disease Father     2 MI's  . Bladder Cancer Father   . Diabetes Other 80  . Cancer Other   . Hypertension Other   . Colon cancer Neg Hx   . Breast cancer Neg Hx    Past Surgical History:  Procedure Laterality Date  . ABDOMINAL HYSTERECTOMY    . APPENDECTOMY    . BLADDER REPAIR  2003  . CARDIAC CATHETERIZATION     12/2010 with Dr. Gwenlyn Found, normal  . CARDIOVASCULAR STRESS TEST  07/2006   Negative, (equivocal), increased uptake of radiotracer mid back  . CARDIOVASCULAR STRESS TEST     11/2010, no ischemia  . CHOLECYSTECTOMY     04/14/12   IN [facility]  . COLONOSCOPY WITH PROPOFOL N/A 11/05/2014   Procedure: COLONOSCOPY WITH PROPOFOL;  Surgeon: Lollie Sails,  MD;  Location: [facility] ENDOSCOPY;  Service: Endoscopy;  Laterality: N/A;  . DOPPLER ECHOCARDIOGRAPHY     small effusion, but normal o/w  . ESOPHAGOGASTRODUODENOSCOPY  03/2000   Negative  . LUMBAR LAMINECTOMY/DECOMPRESSION MICRODISCECTOMY  04/20/2012   Procedure: LUMBAR LAMINECTOMY/DECOMPRESSION MICRODISCECTOMY 1 LEVEL;  Surgeon: Winfield Cunas, MD;  Location: [facility];  Service: Neurosurgery;  Laterality: Left;  LEFT Lumbar five-Sacral one diskectomy  . pubic vaginal sling     2002, 2006  . Stress cardiolite  12/2002   Negative  . TONSILLECTOMY    . TONSILLECTOMY    . US abdominal  08/2003 & 01/2005   Fatty liver (both times)   Social History   Occupational History  .    [employer]  [employer]  Social History Main Topics  . Smoking status: Never Smoker  . Smokeless tobacco: Never Used  . Alcohol use No  . Drug use: No  . Sexual activity: Not on file

## 2016-02-19 NOTE — Addendum Note (Signed)
Addended by: Daylene Posey T on: 02/19/2016 04:14 PM   Modules accepted: Orders

## 2016-02-25 ENCOUNTER — Encounter: Payer: Self-pay | Admitting: Family Medicine

## 2016-02-25 ENCOUNTER — Ambulatory Visit (INDEPENDENT_AMBULATORY_CARE_PROVIDER_SITE_OTHER): Payer: BLUE CROSS/BLUE SHIELD | Admitting: Family Medicine

## 2016-02-25 DIAGNOSIS — G4733 Obstructive sleep apnea (adult) (pediatric): Secondary | ICD-10-CM

## 2016-02-25 NOTE — Progress Notes (Signed)
D/w pt about counseling; she hasn't gone yet but is going to go.  She is still worried about going out in public.  She is still okay for outpatient f/u.    CPAP use/OSA.  Had used CPAP this summer.  Was ordered initially per other MD.  She has been compliant, using it nightly and feels better with use.  She sleeps better with CPAP.  She needs orders though this clinic and note stating compliance.    Prev studies: CPAP Titration Study Report Date of Study: 04/28/2015 Respiratory Information: There were 0 obstructive apnea, 0 central apnea, 0 mixed apnea, and 21 hypopneas. The overall AHI was 3.8. SpO2 nadir was 86%. RERAs were noted during titration. CPAP was titrated to 13 cmH2O. At CPAP of 13 cmH2O, AHI was 0 and SpO2 nadir was 95%. The patient had REM sleep in the supine position at this setting. CPAP of 13 cm H2O appears to be adequate.  Periodic Limb Movement of Sleep Information: There were 35 PLMs with PLM index of 6.3. 18 of these were associated with arousals. PLM-arousal index was 3. POLYSOMONGRAPHY IMPRESSION: Compared to the previous sleep study, there is significant improvement of SDB with PAP titration. CPAP was titrated to 13 cmH2O. At CPAP of 13 cmH2O, AHI was 0 and SpO2 nadir was 95%. The patient had REM sleep in the supine position at this setting. CPAP of 13 cm H2O appears to be be adequate.  Mild PLMS was noted with a PLM index of 6.3. The clinical significance of this finding is unclear.  Mildly reduced sleep efficiency with prolonged sleep latency. Normal REM latency. The patient did have sleep onset and sleep maintenance problem during this sleep study. Abnormal sleep architecture with increased stage 1 and 2 sleep, minimal slow wave sleep, and reduced REM sleep. Fragmentation of sleep improved with titration.  EKG demonstrated normal sinus rhythm. RECOMMENDATIONS: Advise to adjust CPAP to auto-CPAP at 11-15 cmH2O or fixed CPAP at 13 cmH2O with EPR of 3 and follow clinical  symptoms and CPAP downloads.  A Medium F&P Simplus Full Face Mask was used with heated humidity during this study. Adjust interface and heated humidity as needed.  CPAP compliance should be emphasized and monitored closely. Obtain more history regarding RLS. Clinical correlation is needed.   Diagnostic Sleep Study Report Date of Study: 04/02/2015 ESS=13 POLYSOMONGRAPHY IMPRESSION: Mild obstructive sleep apnea with an overall AHI of 12.7. SpO2 nadir was 83%. The respiratory events were clearly worse in REM sleep and in supine sleep. Mild PLMS was noted with a PLM index of 5.5. The clinical significance of this finding is unclear.  Normal sleep efficiency with slightly prolonged sleep latency. Slightly prolonged REM latency. The patient did have sleep onset problem during this sleep study. Abnormal sleep architecture with increased stage 1 and stage 2 sleep, reduced slow wave sleep, and reduced REM sleep. Fragmentation of sleep due to respiratory arousals. EKG demonstrated normal sinus rhythm. RECOMMENDATIONS: Advise to proceed with a CPAP titration study to optimize therapy.   Meds, vitals, and allergies reviewed.   ROS: Per HPI unless specifically indicated in ROS section   nad ncat Neck supple, no LA rrr ctab

## 2016-02-25 NOTE — Patient Instructions (Signed)
Get me a copy of the address for the website you are using for CPAP reporting.  If you need orders for replacement parts, then have the medical supplier send me the order forms.   Take care.  Glad to see you.

## 2016-02-25 NOTE — Progress Notes (Signed)
Pre visit review using our clinic review tool, if applicable. No additional management support is needed unless otherwise documented below in the visit note. 

## 2016-02-26 NOTE — Assessment & Plan Note (Signed)
Compliant nightly. Good relief. Continue as is. If she needs future order she can let me know. Discussed with patient.

## 2016-02-28 ENCOUNTER — Encounter (INDEPENDENT_AMBULATORY_CARE_PROVIDER_SITE_OTHER): Payer: Self-pay | Admitting: Sports Medicine

## 2016-02-28 ENCOUNTER — Ambulatory Visit (INDEPENDENT_AMBULATORY_CARE_PROVIDER_SITE_OTHER): Payer: BLUE CROSS/BLUE SHIELD | Admitting: Sports Medicine

## 2016-02-28 VITALS — BP 108/75 | HR 63 | Ht 68.0 in | Wt 198.0 lb

## 2016-02-28 DIAGNOSIS — G90A Postural orthostatic tachycardia syndrome (POTS): Secondary | ICD-10-CM

## 2016-02-28 DIAGNOSIS — I498 Other specified cardiac arrhythmias: Secondary | ICD-10-CM

## 2016-02-28 DIAGNOSIS — R000 Tachycardia, unspecified: Secondary | ICD-10-CM | POA: Diagnosis not present

## 2016-02-28 DIAGNOSIS — M25562 Pain in left knee: Secondary | ICD-10-CM | POA: Diagnosis not present

## 2016-02-28 DIAGNOSIS — I951 Orthostatic hypotension: Secondary | ICD-10-CM | POA: Diagnosis not present

## 2016-02-28 NOTE — Progress Notes (Signed)
[name] - 50 y.o. female MRN [id number]  Date of birth: 11-12-1965  Office Visit Note: Visit Date: 02/28/2016 PCP: Elsie Stain, MD Referred by: Tonia Ghent, MD  Subjective: Chief Complaint  Patient presents with  . Left Knee - Follow-up  . Right Hip - Follow-up  . Follow-up    Patient states left knee and right hip is doing better.  MRI is scheduled for 03/09/16   HPI: Patient reports overall doing significantly better than during her last office visit & she is essentially back to the the point she was to office visits ago. She has begun working on increasing her physical activity is not having any significant limitations. She does have some persistent crepitation but no significant pain associated with this. She is hoping to be oh to cancel the MRI is scheduled for in 2 weeks given how much better she feels that she has not increased her physical activity back to the point that she needs to for her underlying dysautonomia. ROS: Dysautonomia symptoms are improved with slight improvement in her day-to-day activity level but not consistently active from an aerobic standpoint.. Otherwise per HPI.   Clinical History: No specialty comments available.  She reports that she has never smoked. She has never used smokeless tobacco.  No results for input(s): HGBA1C, LABURIC in the last 8760 hours.  Assessment & Plan: Visit Diagnoses:  1. Acute pain of left knee   2. POTS (postural orthostatic tachycardia syndrome)    Plan: She does still have some minor mechanical symptoms per history as well as nonpainful McMurray's is positive. Recommend continuing it had with the MRI is planned however she does have complete resolution of her symptoms she will call to cancel this. I would like for her to try stressing her knee however before canceling the MRI & discussed appropriate return to physical activity with her today. Okay to continue with topical Aspercreme heat, ice & physical  therapy. Follow-up: Return in about 4 weeks (around 03/27/2016) for repeat clinical exam.  Meds: No orders of the defined types were placed in this encounter.  Procedures: No notes on file   Objective:  VS:  HT:5' 8"  (172.7 cm)   WT:198 lb (89.8 kg)  BMI:30.2    BP:108/75  HR:63bpm  TEMP: ( )  RESP:  Physical Exam: adult female. In no acute distress. Alert and appropriate. Left knee: Well aligned. She does have a small palpable click with McMurray's along the anterior medial knee. No pain with this. No effusion. Distally stable. Extensor mechanism intact. Minimal pain with Thess[place]. Imaging: No results found.   Past Medical/Family/Surgical/Social History: Medications & Allergies reviewed per EMR Patient Active Problem List   Diagnosis Date Noted  . Anxiety state 01/27/2016  . ETD (eustachian tube dysfunction) 01/27/2016  . OSA (obstructive sleep apnea) 10/19/2015  . UTI (urinary tract infection) 11/07/2014  . Advance care planning 10/03/2014  . Hyperglycemia 09/05/2014  . Atypical chest pain 08/06/2014  . Cough 04/10/2013  . Routine general medical examination at a health care facility 03/30/2013  . Back pain 03/16/2012  . POTS (postural orthostatic tachycardia syndrome) 04/08/2011  . SOB (shortness of breath) on exertion 12/25/2010  . REACTION, ACUTE STRESS W/EMOTIONAL DSTURB 09/09/2006  . ESSENTIAL HYPERTENSION 09/09/2006  . B12 deficiency 09/08/2006  . HYPERLIPIDEMIA 09/08/2006  . SLEEP APNEA, OBSTRUCTIVE, MILD 09/08/2006  . MIGRAINE HEADACHE 09/08/2006  . GERD 09/08/2006  . CHEST PAIN 09/08/2006  . URINARY INCONTINENCE, MIXED 09/08/2006   Past Medical History:  Diagnosis Date  . B12 deficiency   . Compression fracture of cervical vertebra (HCC)    C5-6  . Diabetes mellitus    DIET CONTROLLED  . Diverticulitis    04/2008  . Dysrhythmia    POTS (postural orthostatic tachycardia syndrome)  . Fatty liver   . GERD (gastroesophageal reflux disease)   .  Hyperlipidemia   . Hypertension   . Hyperthyroidism    f/u testing neg  . Kidney stones   . Mononucleosis 2005  . NASH (nonalcoholic steatohepatitis)   . Neuromuscular disorder ([place])    DR LOVE HAD 1 EPISODE 2010 INVOL MOVEMENT  LT ARM   . OSA (obstructive sleep apnea)   . Pneumonia    HX PNEUMONIA   . Pneumonia   . PONV (postoperative nausea and vomiting)    1994 HYST  WOKE UP    . POTS (postural orthostatic tachycardia syndrome)   . RLS (restless legs syndrome)   . Seizures ([place])   . Sleep apnea   . Steatosis of liver   . Thyroid nodule   . Treadmill stress test negative for angina pectoris 01/2000   Family History  Problem Relation Age of Onset  . Stroke Mother   . Heart disease Father     2 MI's  . Bladder Cancer Father   . Diabetes Other 80  . Cancer Other   . Hypertension Other   . Colon cancer Neg Hx   . Breast cancer Neg Hx    Past Surgical History:  Procedure Laterality Date  . ABDOMINAL HYSTERECTOMY    . APPENDECTOMY    . BLADDER REPAIR  2003  . CARDIAC CATHETERIZATION     12/2010 with Dr. Gwenlyn Found, normal  . CARDIOVASCULAR STRESS TEST  07/2006   Negative, (equivocal), increased uptake of radiotracer mid back  . CARDIOVASCULAR STRESS TEST     11/2010, no ischemia  . CHOLECYSTECTOMY     04/14/12   IN [facility]  . COLONOSCOPY WITH PROPOFOL N/A 11/05/2014   Procedure: COLONOSCOPY WITH PROPOFOL;  Surgeon: Lollie Sails, MD;  Location[facility] ENDOSCOPY;  Service: Endoscopy;  Laterality: N/A;  . DOPPLER ECHOCARDIOGRAPHY     small effusion, but normal o/w  . ESOPHAGOGASTRODUODENOSCOPY  03/2000   Negative  . LUMBAR LAMINECTOMY/DECOMPRESSION MICRODISCECTOMY  04/20/2012   Procedure: LUMBAR LAMINECTOMY/DECOMPRESSION MICRODISCECTOMY 1 LEVEL;  Surgeon: Winfield Cunas, MD;  Location: [facility]S;  Service: Neurosurgery;  Laterality: Left;  LEFT Lumbar five-Sacral one diskectomy  . pubic vaginal sling     2002, 2006  . Stress cardiolite  12/2002   Negative  .  TONSILLECTOMY    . TONSILLECTOMY    . US abdominal  08/2003 & 01/2005   Fatty liver (both times)   Social History   Occupational History  .    [employer]  [place] History Main Topics  . Smoking status: Never Smoker  . Smokeless tobacco: Never Used  . Alcohol use No  . Drug use: No  . Sexual activity: Not on file

## 2016-03-09 ENCOUNTER — Other Ambulatory Visit: Payer: BLUE CROSS/BLUE SHIELD

## 2016-03-12 ENCOUNTER — Telehealth: Payer: Self-pay | Admitting: Family Medicine

## 2016-03-12 DIAGNOSIS — R413 Other amnesia: Secondary | ICD-10-CM
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
## 2016-03-13 NOTE — Telephone Encounter (Signed)
Ordered

## 2016-03-13 NOTE — Telephone Encounter (Signed)
Patient advised.

## 2016-03-18 ENCOUNTER — Ambulatory Visit (INDEPENDENT_AMBULATORY_CARE_PROVIDER_SITE_OTHER): Payer: BLUE CROSS/BLUE SHIELD | Admitting: Diagnostic Neuroimaging

## 2016-03-18 ENCOUNTER — Encounter: Payer: Self-pay | Admitting: Diagnostic Neuroimaging

## 2016-03-18 VITALS — BP 116/79 | HR 65 | Ht 67.5 in | Wt 195.0 lb

## 2016-03-18 DIAGNOSIS — F0781 Postconcussional syndrome: Secondary | ICD-10-CM | POA: Diagnosis not present

## 2016-03-18 DIAGNOSIS — R413 Other amnesia: Secondary | ICD-10-CM | POA: Diagnosis not present
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
## 2016-03-18 NOTE — Patient Instructions (Addendum)

## 2016-03-19 ENCOUNTER — Ambulatory Visit
Admission: RE | Admit: 2016-03-19 | Discharge: 2016-03-19 | Disposition: A | Discharge status: Home / Self Care | Payer: BLUE CROSS/BLUE SHIELD | Source: Ambulatory Visit | Attending: Sports Medicine | Admitting: Sports Medicine

## 2016-03-19 DIAGNOSIS — M25562 Pain in left knee: Secondary | ICD-10-CM

## 2016-03-24 ENCOUNTER — Ambulatory Visit (INDEPENDENT_AMBULATORY_CARE_PROVIDER_SITE_OTHER): Payer: BLUE CROSS/BLUE SHIELD | Admitting: Sports Medicine

## 2016-03-25 ENCOUNTER — Encounter (INDEPENDENT_AMBULATORY_CARE_PROVIDER_SITE_OTHER): Payer: Self-pay | Admitting: Sports Medicine

## 2016-03-25 ENCOUNTER — Ambulatory Visit (INDEPENDENT_AMBULATORY_CARE_PROVIDER_SITE_OTHER): Payer: BLUE CROSS/BLUE SHIELD | Admitting: Sports Medicine

## 2016-03-25 VITALS — BP 111/75 | HR 69 | Ht 67.5 in | Wt 195.0 lb

## 2016-03-25 DIAGNOSIS — M25562 Pain in left knee: Secondary | ICD-10-CM | POA: Diagnosis not present

## 2016-03-25 DIAGNOSIS — S8001XA Contusion of right knee, initial encounter: Secondary | ICD-10-CM | POA: Diagnosis not present
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
## 2016-03-25 NOTE — Patient Instructions (Signed)
I am transferring practices as of January 1st  to [place] at [facility].  This is a great opportunity & I am saddened to be leaving [facility] however & excited for new opportunities. I will continue to be seeing patients at [facility] through the end of December. I am happy to see you at the new location but also am confident that you are in great hands with the excellent providers here at [facility].  We are not currently scheduling patients at the new location at this time but if you look on [facility] website a contact information should be available there closer to January. Additionally [email or web address] will have information when it becomes available.    The telephone number will be [phone]  - Nobody will be answering this phone number until closer to January.

## 2016-05-04 ENCOUNTER — Ambulatory Visit (INDEPENDENT_AMBULATORY_CARE_PROVIDER_SITE_OTHER): Payer: Managed Care, Other (non HMO) | Admitting: Family Medicine

## 2016-05-04 ENCOUNTER — Encounter: Payer: Self-pay | Admitting: Family Medicine

## 2016-05-04 DIAGNOSIS — F0781 Postconcussional syndrome: Secondary | ICD-10-CM

## 2016-05-04 NOTE — Patient Instructions (Addendum)
I can look at your papers about your condition if needed.   If you are going to apply for disability, then that will need to come through the social security office.   Take care.  Glad to see you.  Update me as needed.
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
## 2016-05-04 NOTE — Progress Notes (Signed)
Pre visit review using our clinic review tool, if applicable. No additional management support is needed unless otherwise documented below in the visit note. 
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
## 2016-05-05 ENCOUNTER — Encounter: Payer: Self-pay | Admitting: Family Medicine

## 2016-05-06 NOTE — Telephone Encounter (Signed)
Patient called to make sure Dr.Duncan received her my chart message and the attachment.

## 2016-05-16 ENCOUNTER — Encounter: Payer: Self-pay | Admitting: Family Medicine

## 2016-05-18 NOTE — Telephone Encounter (Signed)
[facility] notified that forms are ready to be picked up at the front desk.  Copy sent for scanning.

## 2016-05-27 ENCOUNTER — Encounter: Payer: Self-pay | Admitting: Family Medicine

## 2016-05-28 ENCOUNTER — Telehealth: Payer: Self-pay | Admitting: Family Medicine

## 2016-05-28 NOTE — Telephone Encounter (Signed)
Any time a pt attaches an image to a mychart message it's going to be under the Media Tab under Chart review. They are all attached there, please review

## 2016-05-28 NOTE — Telephone Encounter (Signed)
Please call pt.  See mychart message, see about getting record request done.  I don't see any attachments. Thanks.

## 2016-06-08 IMAGING — MG MM DIAG BREAST TOMO BILATERAL
6 of 12 series · 6 of 36 positions shown · non-contrast
Comparison: Previous exam(s).

CLINICAL DATA: Patient describes pain within the outer aspects of
each breast which has decreased recently. Patient also describes
pain about the right clavicle. Patient denies any palpable mass.

Patient describes recent identification of a renal mass (awaiting
MRI results).
EXAM:
DIGITAL DIAGNOSTIC BILATERAL MAMMOGRAM WITH 3D TOMOSYNTHESIS AND CAD

[L MLO]
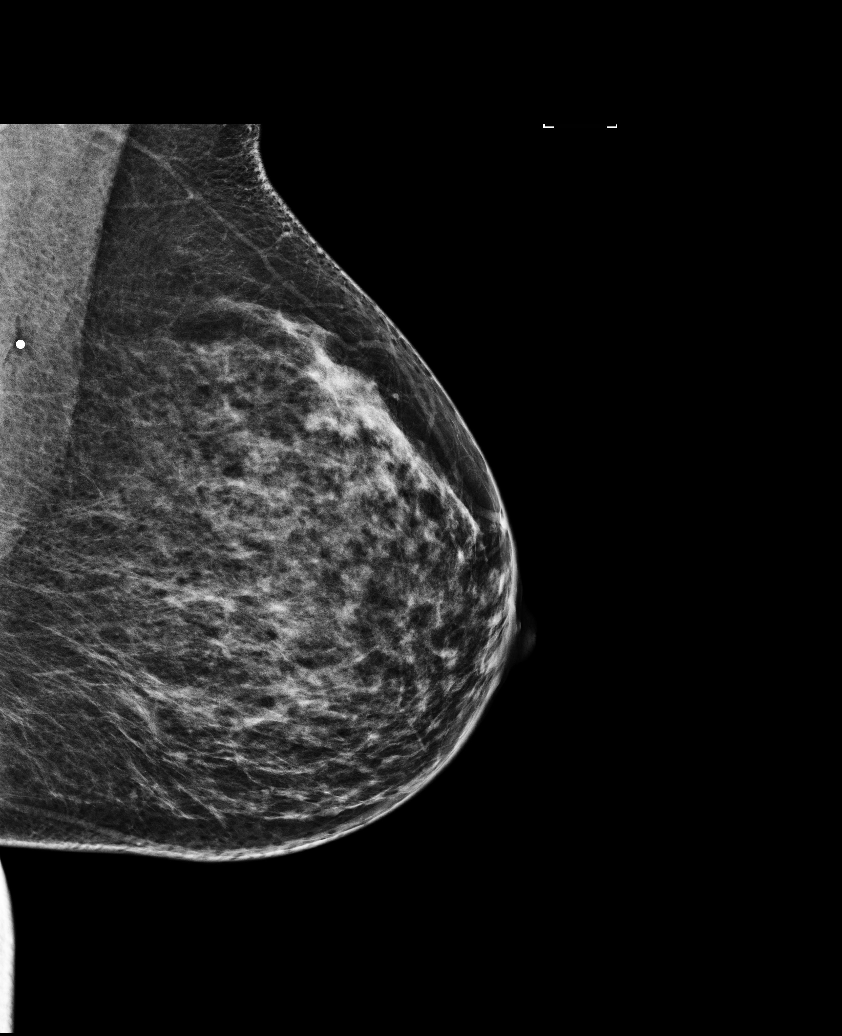

[R MLO]
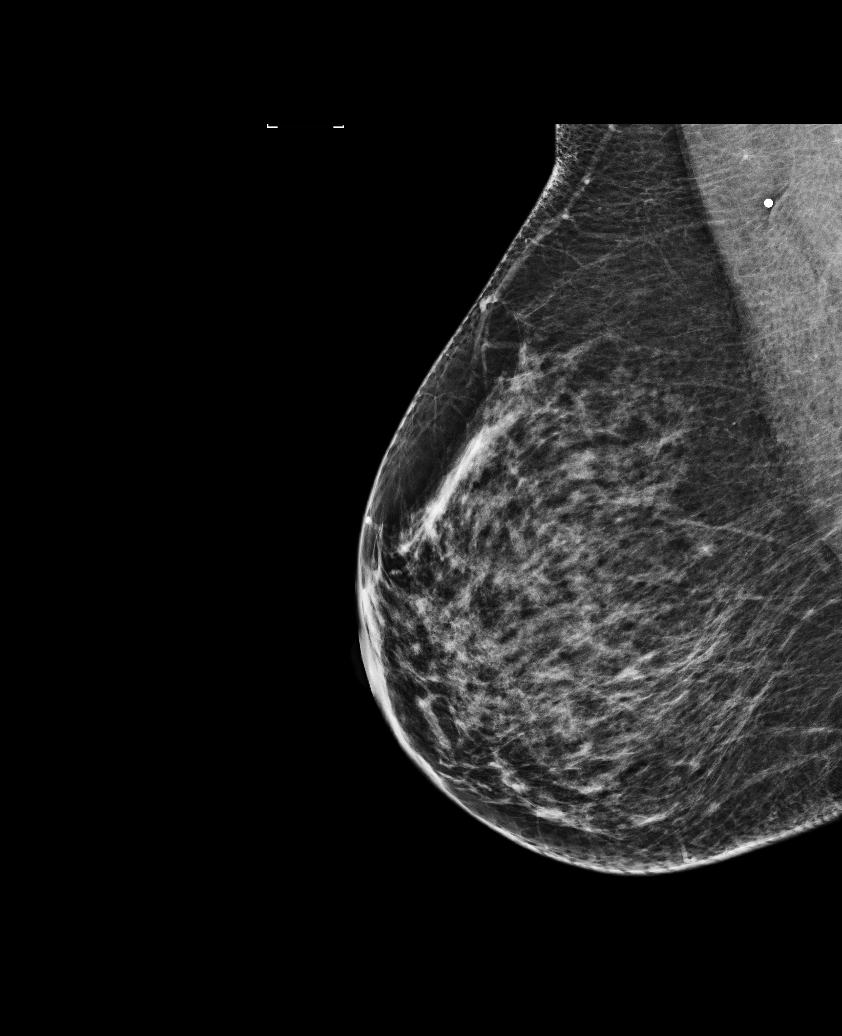

[R CC]
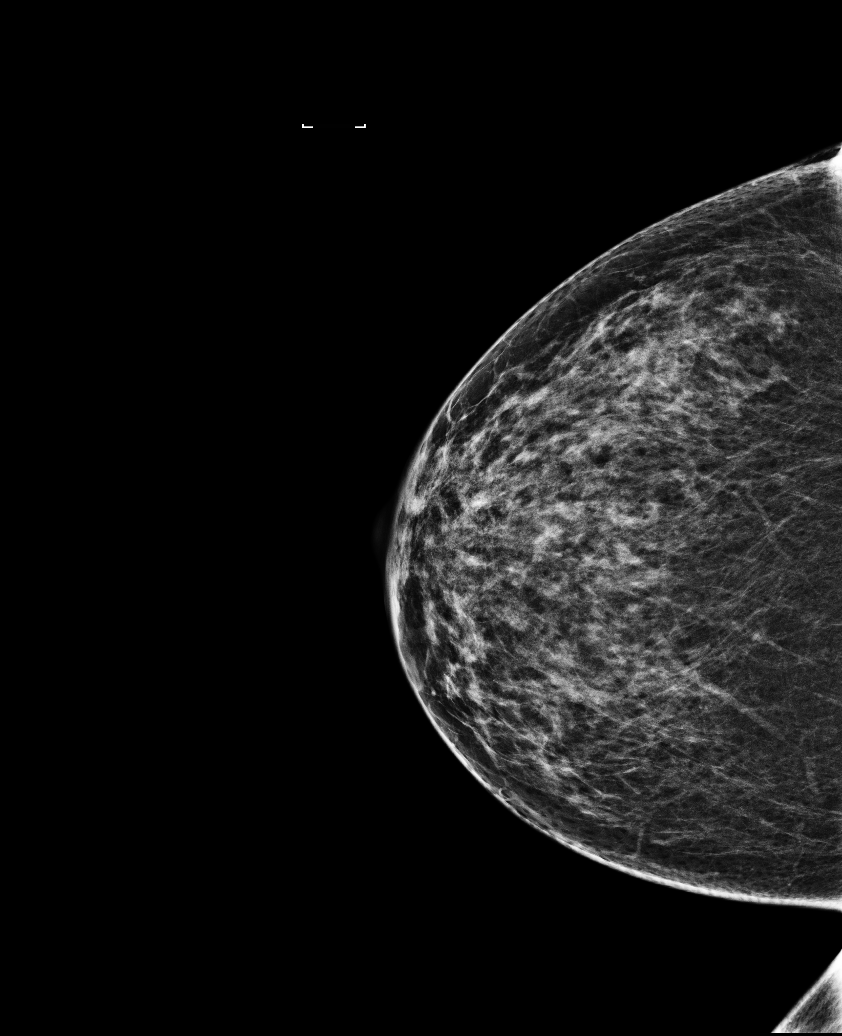

[L CC]
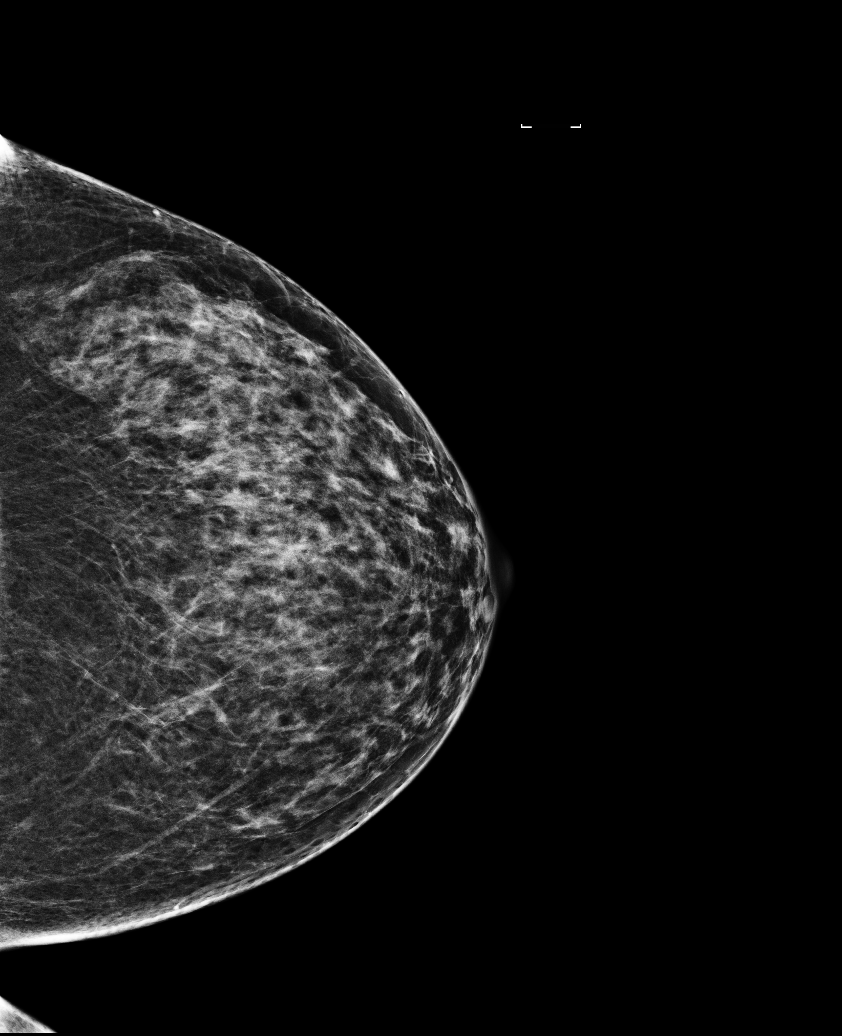

[L TAN]
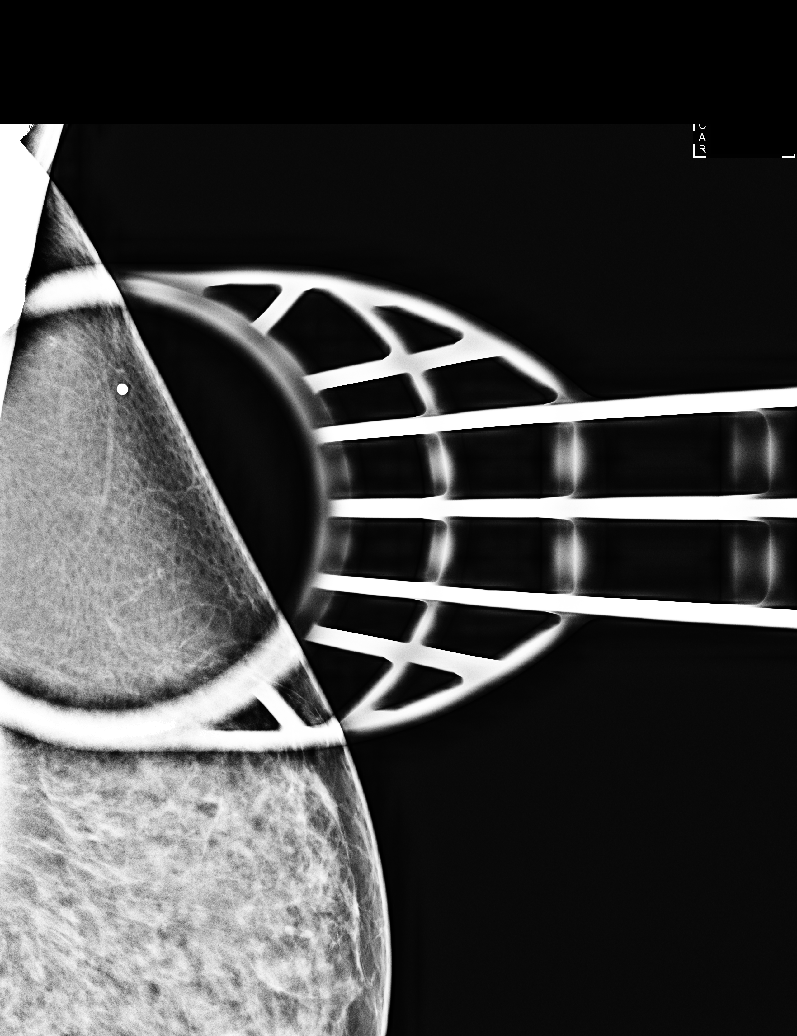

[R TAN]
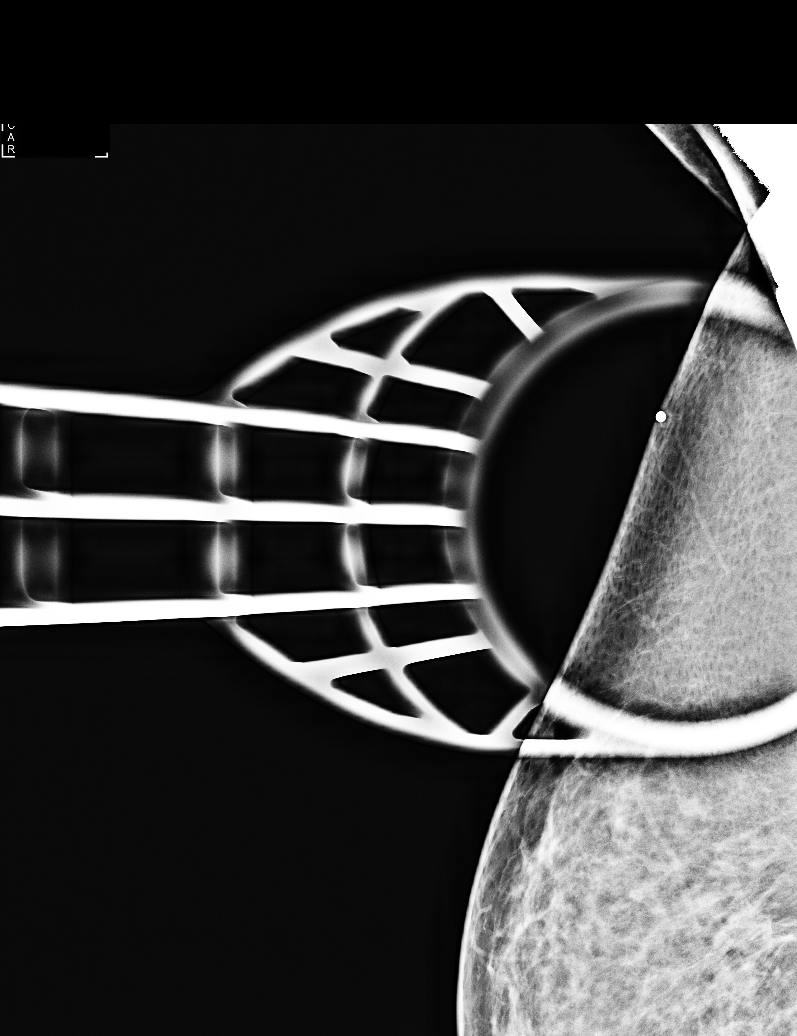

[6 of 36 positions shown; findings below may reference images not displayed]

ACR Breast Density Category c: The breast tissue is heterogeneously
dense, which may obscure small masses.
FINDINGS: Bilateral CC and MLO projections were obtained with 3D
tomosynthesis. Additional spot compression views were obtained of
the outer breasts bilaterally, corresponding to the areas of
clinical concern, with overlying skin markers in place.

There are no dominant masses, suspicious calcifications or secondary
signs of malignancy within either breast.

Specifically, there is no mammographic abnormality in the areas of
clinical concern, outer breasts bilaterally, which are demarcated
with overlying skin markers.

Mammographic images were processed with CAD.
IMPRESSION: No mammographic evidence of malignancy within either breast.

RECOMMENDATION:
Screening mammogram in one year.(Code:SX-T-O66)

Patient denies any palpable mass. Patient instructed to follow-up
with referring physician if a palpable abnormality was identified.
Ultrasound could be performed if a palpable abnormality was
identified.

Would also consider CT for further evaluation of patient's symptoms
if the renal mass was deemed to be concerning by upcoming MRI
result.

I have discussed the findings and recommendations with the patient.
Results were also provided in writing at the conclusion of the
visit. If applicable, a reminder letter will be sent to the patient
regarding the next appointment.

BI-RADS CATEGORY  1: Negative.

## 2016-06-10 ENCOUNTER — Encounter: Payer: Self-pay | Admitting: Family Medicine

## 2016-06-10 ENCOUNTER — Ambulatory Visit (INDEPENDENT_AMBULATORY_CARE_PROVIDER_SITE_OTHER): Payer: 59 | Admitting: Family Medicine

## 2016-06-10 DIAGNOSIS — R0789 Other chest pain: Secondary | ICD-10-CM

## 2016-06-10 MED ORDER — RANITIDINE HCL 150 MG PO TABS: 150.0000 mg | ORAL_TABLET | Freq: Two times a day (BID) | ORAL | Status: DC

## 2016-06-10 NOTE — Progress Notes (Signed)
Pre visit review using our clinic review tool, if applicable. No additional management support is needed unless otherwise documented below in the visit note. 

## 2016-06-10 NOTE — Telephone Encounter (Signed)
Thanks
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
## 2016-06-10 NOTE — Patient Instructions (Addendum)
Start taking zantac twice a day and ask [facility] about possible use of nitroglycerin.  It can help with esophageal spasms but it will likely lower your BP.  Take care.  Glad to see you.

## 2016-06-11 NOTE — Assessment & Plan Note (Signed)
Presumed esophageal spasm. She has some difficulty with swallowing while she was having atypical chest pain. She did not have typical exertional symptoms. She had negative stress testing as inpatient per patient report. She has been able to exercise in the meantime without chest pain. She has pain free now. I will await input from [facility] in the meantime. Discussed with patient about options. Okay to try Zantac in the meantime. See after visit summary. I did not start her on nitroglycerin on a when necessary basis given her history of relative hypotension. She can talk to the [facility] clinic about this. Rationale discussed. She agrees. At this point still okay for outpatient follow-up. >25 minutes spent in face to face time with patient, >50% spent in counselling or coordination of care.

## 2016-08-27 ENCOUNTER — Ambulatory Visit (INDEPENDENT_AMBULATORY_CARE_PROVIDER_SITE_OTHER): Payer: 59 | Admitting: Physician Assistant

## 2016-08-27 ENCOUNTER — Encounter: Payer: Self-pay | Admitting: Physician Assistant

## 2016-08-27 VITALS — BP 110/80 | HR 81 | Ht 67.5 in | Wt 190.8 lb

## 2016-08-27 DIAGNOSIS — J069 Acute upper respiratory infection, unspecified: Secondary | ICD-10-CM | POA: Diagnosis not present

## 2016-08-27 DIAGNOSIS — H6501 Acute serous otitis media, right ear: Secondary | ICD-10-CM | POA: Diagnosis not present

## 2016-08-27 MED ORDER — AMOXICILLIN-POT CLAVULANATE 875-125 MG PO TABS: 1.0000 | ORAL_TABLET | Freq: Two times a day (BID) | ORAL | 0 refills | Status: AC

## 2016-08-27 MED ORDER — BENZONATATE 100 MG PO CAPS: 100.0000 mg | ORAL_CAPSULE | Freq: Three times a day (TID) | ORAL | 1 refills | Status: DC | PRN
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
## 2016-08-27 NOTE — Patient Instructions (Signed)
Start the oral antibiotic, take with food to prevent GI upset.  Push fluids and stay hydrated. Rest.  May use Tessalon as needed for cough.  Please follow-up if your symptoms worsen or you lack improvement despite treatment.

## 2016-08-31 ENCOUNTER — Ambulatory Visit (INDEPENDENT_AMBULATORY_CARE_PROVIDER_SITE_OTHER): Payer: 59 | Admitting: Family Medicine

## 2016-08-31 ENCOUNTER — Encounter: Payer: Self-pay | Admitting: Family Medicine

## 2016-08-31 VITALS — BP 106/74 | HR 81 | Temp 98.1°F | Wt 185.0 lb

## 2016-08-31 DIAGNOSIS — J069 Acute upper respiratory infection, unspecified: Secondary | ICD-10-CM | POA: Insufficient documentation

## 2016-08-31 DIAGNOSIS — R000 Tachycardia, unspecified: Secondary | ICD-10-CM

## 2016-08-31 DIAGNOSIS — G90A Postural orthostatic tachycardia syndrome (POTS): Secondary | ICD-10-CM

## 2016-08-31 DIAGNOSIS — F0781 Postconcussional syndrome: Secondary | ICD-10-CM | POA: Diagnosis not present

## 2016-08-31 DIAGNOSIS — I951 Orthostatic hypotension: Secondary | ICD-10-CM

## 2016-08-31 DIAGNOSIS — I498 Other specified cardiac arrhythmias: Secondary | ICD-10-CM
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
## 2016-08-31 NOTE — Assessment & Plan Note (Addendum)
Endorses syncopal episode last evening in setting of URI, propranolol use, and possible dehydration.

## 2016-08-31 NOTE — Assessment & Plan Note (Addendum)
On day 3 of abx for R AOM dx last week - ear is already looking better. I've asked her to finish augmentin course.  Anticipate other symptoms may be viral. Lungs sound clear. Offered CXR, pt declined.  Further supportive care reviewed.  Dehydration likely contributing. Pt aware if not improving with treatment may need to seek ER care for consideration of IVF.  Declines hydrocodone cough syrup.  Update if not improving with treatment.

## 2016-08-31 NOTE — Patient Instructions (Signed)
Lungs sound ok today.  Possible dehydration contributing - continue pushing small sips of fluid throughout the day.  Finish augmentin course.  May use mucinex with large glass of fluid. May take delsym/dimetapp. Let us know if not improving with time.

## 2016-09-25 ENCOUNTER — Encounter: Payer: Self-pay | Admitting: Family Medicine

## 2016-09-25 ENCOUNTER — Ambulatory Visit (INDEPENDENT_AMBULATORY_CARE_PROVIDER_SITE_OTHER): Payer: 59 | Admitting: Family Medicine

## 2016-09-25 DIAGNOSIS — F0781 Postconcussional syndrome: Secondary | ICD-10-CM | POA: Diagnosis not present

## 2016-09-25 MED ORDER — PROPRANOLOL HCL ER 80 MG PO CP24
80.0000 mg | ORAL_CAPSULE | Freq: Every day | ORAL | Status: DC
Start: 2016-09-25 — End: 2019-05-02
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
## 2016-09-25 NOTE — Patient Instructions (Signed)
Don't change your meds for now.  Let me check with neurology about options re: your headaches and light sensitivity.  Take care.  Glad to see you.
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
## 2016-09-28 ENCOUNTER — Telehealth: Payer: Self-pay | Admitting: *Deleted

## 2016-09-28 NOTE — Telephone Encounter (Signed)
Dr Rhea Belton reply routed to Dr Orlinda Blalock, PCP.
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
## 2016-09-29 ENCOUNTER — Encounter: Payer: Self-pay | Admitting: *Deleted

## 2016-09-29 NOTE — Telephone Encounter (Signed)
Pt said she is taking 400 mg bid of magnesium daily since September 2017. Call transferred to RN

## 2016-09-29 NOTE — Telephone Encounter (Addendum)
Spoke with patient who questioned what else she can do for headaches. She has been taking mag oxide 400 mg twice daily since Sept 2017. This RN reviewed Dr Greer Pickerel' recommendations and advised she add riboflavin 100 mg twice daily, OTC. Patient stated with her liver and kidney history, she doesn't like to take many medications, tries to stay with more natural treatments. She was in agreement to take riboflavin. Patient stated she continues to have light and sound sensitivity and balance issues; she is getting vestibular therapy with good results. She stated she saw her neuro ophthalmologist for check up because she's had cataract surgery in past. She wanted to be sure her light sensitivity wasn't related to that. She stated her dr told her that her eyes are fine; it's related to post concussion. She inquired which provider to FU with. This RN suggested she take riboflavin for 2 months, call back if her condition does not improve. Advised Dr Leta Baptist would be glad to see her for follow up if needed. She stated she is improving gradually and can sometimes avoid or get rid of headache, occasionally takes Aleve with partial relief. This RN suggested she buy very dark sunglasses to wear as needed. Advised she call back for further needs, questions. Patient verbalized understanding, appreciation.

## 2016-09-29 NOTE — Telephone Encounter (Signed)
LVM advising patient per Dr Damita Dunnings to increase magnesium. Advised she take magnesium oxide 400 mg twice a day. Repeated these instructions, left office number for any questions.

## 2016-09-29 NOTE — Telephone Encounter (Signed)
Notify pt.  Would be okay to inc magnesium dose.  Can take magnesium oxide 400 mg twice a day.  See if that helps.  Thanks.

## 2016-12-25 ENCOUNTER — Encounter: Payer: Self-pay | Admitting: Family Medicine

## 2016-12-25 ENCOUNTER — Ambulatory Visit (INDEPENDENT_AMBULATORY_CARE_PROVIDER_SITE_OTHER): Payer: BLUE CROSS/BLUE SHIELD | Admitting: Family Medicine

## 2016-12-25 DIAGNOSIS — F0781 Postconcussional syndrome: Secondary | ICD-10-CM | POA: Diagnosis not present

## 2016-12-25 NOTE — Patient Instructions (Addendum)
Keep going as is.  Update me as needed.  The goal is for gradual improvement.   Take care.  Glad to see you.
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
## 2016-12-27 NOTE — Assessment & Plan Note (Addendum)
With slow improvement of sx, but not back to baseline, unable to drive long distances, trouble with photophobia and phonophobia, still with panic and sadness.  No SI/HI.  Not yet able to go back to work given her ongoing sx- she still has these sx without the addition of work stressors.  I have no expectation that she could work effectively at this point.  Would continue as is, with the plan to re-eval her condition after another period of time for improvement.  I wouldn't treat her episodic borderline BP elevations given her POTS.  D/w pt. She agrees.  >25 minutes spent in face to face time with patient, >50% spent in counselling or coordination of care.

## 2016-12-28 ENCOUNTER — Encounter: Payer: Self-pay | Admitting: Family Medicine

## 2017-01-14 ENCOUNTER — Telehealth: Payer: Self-pay | Admitting: Sports Medicine

## 2017-01-14 ENCOUNTER — Ambulatory Visit (INDEPENDENT_AMBULATORY_CARE_PROVIDER_SITE_OTHER): Payer: BLUE CROSS/BLUE SHIELD | Admitting: Sports Medicine

## 2017-01-14 ENCOUNTER — Ambulatory Visit (INDEPENDENT_AMBULATORY_CARE_PROVIDER_SITE_OTHER): Payer: BLUE CROSS/BLUE SHIELD

## 2017-01-14 ENCOUNTER — Encounter: Payer: Self-pay | Admitting: Sports Medicine

## 2017-01-14 VITALS — BP 120/70 | HR 67 | Ht 67.5 in | Wt 189.2 lb

## 2017-01-14 DIAGNOSIS — F0781 Postconcussional syndrome: Secondary | ICD-10-CM | POA: Diagnosis not present

## 2017-01-14 DIAGNOSIS — M79602 Pain in left arm: Secondary | ICD-10-CM

## 2017-01-14 DIAGNOSIS — G901 Familial dysautonomia [Riley-Day]: Secondary | ICD-10-CM

## 2017-01-14 NOTE — Progress Notes (Addendum)
OFFICE VISIT NOTE Juanda Bond. [place], [place] [facility] [place]  EVITA MERIDA - 51 y.o. female MRN [id number]  Date of birth: 1965/09/05  Visit Date: 01/14/2017  PCP: Tonia Ghent, MD   Referred by: Tonia Ghent, MD  Thalia Bloodgood PT, LAT, ATC acting as scribe for Dr. Paulla Fore.  SUBJECTIVE:   Chief Complaint  Patient presents with  . New Patient (Initial Visit)    L UE pain   HPI: As below and per problem based documentation when appropriate.  Ms Loletha Grayer is an established pt here today w/ c/o L UE pain.  Pt notes that she has been having pain in her L shoulder for a few months due to having to reach far overhead to pull the cord/string on an overhead lightbulb.  She notes that the pain extends distally into her L forearm.  She also reports having pain that wakes her up at night in her L middle and ring finger that wakes her up at night.  She states that the finger pain may be unrelated.  She notes that the pain in her L shoulder and forearm has recently intensified over the past few days w/ no new MOI.  She also reports that the pain in her L shoulder also radiates into her neck as well as distally into her L arm.  She points to the location of most pain along the superior scapular border and rates her pain today as a 4-5/10.  She notes that at its worst, she would rate it as a 9/10.  She states that last night her pain was a 9/10 and was bad enough that she took 2 Aleve.  Pt notes that she has tried ice, cupping, massage and occasional medication and that these things have given her temporary relief but nothing long-lasting.      Review of Systems  Constitutional: Negative for chills and fever.  HENT: Negative.   Eyes: Negative.   Respiratory: Positive for shortness of breath. Negative for cough and wheezing.   Cardiovascular: Positive for palpitations. Negative for chest pain.  Gastrointestinal: Negative for heartburn and  nausea.  Genitourinary: Negative.   Musculoskeletal: Positive for falls, joint pain and neck pain.  Neurological: Positive for dizziness, tingling and headaches.  Endo/Heme/Allergies: Does not bruise/bleed easily.  Psychiatric/Behavioral: Negative for depression. The patient is nervous/anxious. The patient does not have insomnia.     Otherwise per HPI.  HISTORY & PERTINENT PRIOR DATA:  No specialty comments available. She reports that she has never smoked. She has never used smokeless tobacco. No results for input(s): HGBA1C, LABURIC in the last 8760 hours. Allergies reviewed per EMR Prior to Admission medications   Medication Sig Start Date End Date Taking? Authorizing Provider  cholecalciferol (VITAMIN D) 1000 units tablet Take 10,000 Units by mouth daily.    Yes [provider]  hydrochlorothiazide (HYDRODIURIL) 25 MG tablet Take 1 tablet (25 mg total) by mouth daily as needed. * Needs office visit for additional refills* 04/02/14  Yes Tonia Ghent, MD  magnesium 30 MG tablet Take 30 mg by mouth 2 (two) times daily.   Yes [provider]  Multiple Vitamin (MULTIVITAMIN) tablet Take 1 tablet by mouth daily.     Yes [provider]  ONE TOUCH ULTRA TEST test strip USE ONE TWICE DAILY  04/03/14  Yes Tonia Ghent, MD  [facility] DELICA LANCETS FINE MISC check blood sugar twice daily and as  directed  04/03/14  Yes Tonia Ghent, MD  Phenazopyridine HCl (AZO TABS PO) Take by mouth.   Yes [provider]  propranolol (INDERAL) 20 MG tablet Take 20 mg by mouth as needed.    Yes [provider]  propranolol (INDERAL) 40 MG tablet Take 40 mg by mouth as needed.   Yes [provider]  propranolol ER (INDERAL LA) 80 MG 24 hr capsule Take 1 capsule (80 mg total) by mouth daily. 09/25/16  Yes Tonia Ghent, MD   Patient Active Problem List   Diagnosis Date Noted  . Left arm pain 01/28/2017  . Dysautonomia ([place]) 01/28/2017  . Post  concussion syndrome 05/04/2016  . Anxiety state 01/27/2016  . OSA (obstructive sleep apnea) 10/19/2015  . Advance care planning 10/03/2014  . Hyperglycemia 09/05/2014  . Routine general medical examination at a health care facility 03/30/2013  . POTS (postural orthostatic tachycardia syndrome) 04/08/2011  . SOB (shortness of breath) on exertion 12/25/2010  . REACTION, ACUTE STRESS W/EMOTIONAL DSTURB 09/09/2006  . ESSENTIAL HYPERTENSION 09/09/2006  . B12 deficiency 09/08/2006  . HYPERLIPIDEMIA 09/08/2006  . SLEEP APNEA, OBSTRUCTIVE, MILD 09/08/2006  . MIGRAINE HEADACHE 09/08/2006  . GERD 09/08/2006  . URINARY INCONTINENCE, MIXED 09/08/2006   Past Medical History:  Diagnosis Date  . B12 deficiency   . Cirrhosis of liver not due to alcohol [facility])    tx at [facility]  . Compression fracture of cervical vertebra (HCC)    C5-6  . Diabetes mellitus    DIET CONTROLLED  . Diverticulitis    04/2008  . Dysautonomia ([place])   . Dysrhythmia    POTS (postural orthostatic tachycardia syndrome)  . Fatty liver   . GERD (gastroesophageal reflux disease)   . Hyperlipidemia   . Hypertension   . Hyperthyroidism    f/u testing neg  . Kidney cyst, acquired    benign "lesion"  . Mononucleosis 2005  . NASH (nonalcoholic steatohepatitis)   . Neuromuscular disorder ([place])    DR LOVE HAD 1 EPISODE 2010 INVOL MOVEMENT  LT ARM   . OSA (obstructive sleep apnea)   . Pneumonia    HX PNEUMONIA   . Pneumonia   . PONV (postoperative nausea and vomiting)    1994 HYST  WOKE UP    . POTS (postural orthostatic tachycardia syndrome)   . RLS (restless legs syndrome)   . Seizures ([place])   . Sleep apnea   . Steatosis of liver   . Thyroid nodule   . Treadmill stress test negative for angina pectoris 01/2000   Family History  Problem Relation Age of Onset  . Stroke Mother   . Hypertension Mother   . Heart disease Father        2 MI's  . Bladder Cancer Father   . Diabetes Other 80  . Cancer Other   .  Hypertension Other   . Hypertension Maternal Grandmother   . Stroke Maternal Grandmother   . Heart disease Maternal Grandfather   . Hypertension Maternal Grandfather   . Stroke Maternal Grandfather   . Heart disease Paternal Grandfather   . Colon cancer Neg Hx   . Breast cancer Neg Hx    Past Surgical History:  Procedure Laterality Date  . ABDOMINAL HYSTERECTOMY    . APPENDECTOMY    . BLADDER REPAIR  2003  . CARDIAC CATHETERIZATION     12/2010 with Dr. Gwenlyn Found, normal  . CARDIOVASCULAR STRESS TEST  07/2006   Negative, (equivocal), increased uptake of  radiotracer mid back  . CARDIOVASCULAR STRESS TEST     11/2010, no ischemia  . CHOLECYSTECTOMY     04/14/12   IN [facility]  . COLONOSCOPY WITH PROPOFOL N/A 11/05/2014   Procedure: COLONOSCOPY WITH PROPOFOL;  Surgeon: Lollie Sails, MD;  Location[facility] ENDOSCOPY;  Service: Endoscopy;  Laterality: N/A;  . DOPPLER ECHOCARDIOGRAPHY     small effusion, but normal o/w  . ESOPHAGOGASTRODUODENOSCOPY  03/2000   Negative  . LUMBAR LAMINECTOMY/DECOMPRESSION MICRODISCECTOMY  04/20/2012   Procedure: LUMBAR LAMINECTOMY/DECOMPRESSION MICRODISCECTOMY 1 LEVEL;  Surgeon: Winfield Cunas, MD;  Location: [facility] NEURO ORS;  Service: Neurosurgery;  Laterality: Left;  LEFT Lumbar five-Sacral one diskectomy  . pubic vaginal sling     2002, 2006  . Stress cardiolite  12/2002   Negative  . TONSILLECTOMY    . TONSILLECTOMY    . US abdominal  08/2003 & 01/2005   Fatty liver (both times)   Social History   Occupational History  .      [employer]   Social History Main Topics  . Smoking status: Never Smoker  . Smokeless tobacco: Never Used  . Alcohol use No  . Drug use: No  . Sexual activity: Not on file    OBJECTIVE:  VS:  HT:5' 7.5" (171.5 cm)   WT:189 lb 3.2 oz (85.8 kg)  BMI:29.18    BP:120/70  HR:67bpm  TEMP: ( )  RESP:96 % EXAM: Findings:  Adult female.  No acute distress.  Wearing sunglasses due to photosensitivity.   She has full cervical range of motion although a small amount of pain with side bending.  Left shoulder is overall well aligned.  She has a small amount of pain with brachial plexus squeeze but is minimally nonradicular.  No pain with arm squeeze test.  Internal and external rotation strength is 5+/5.  Empty can testing is slightly painful but strength is well-preserved.  Mild pain with speeds testing but no pain with O'Brien's testing.    RADIOLOGY: DG Cervical Spine 2 or 3 views CLINICAL DATA:  LEFT arm and shoulder pain. Patient felt a pop in shoulder 2 months ago.  EXAM: CERVICAL SPINE - 2-3 VIEW  COMPARISON:  06/24/2015.  FINDINGS: There is no evidence of cervical spine fracture or prevertebral soft tissue swelling. Alignment is normal. There is mild disc space narrowing at C5-C6 with osseous spurring similar to priors. No other significant bone abnormalities are identified.  IMPRESSION: Cervical spondylosis.  Stable exam.  Electronically Signed   By: Staci Righter M.D.   On: 01/14/2017 15:11  ASSESSMENT & PLAN:     ICD-10-CM   1. Left arm pain M79.602 DG Cervical Spine 2 or 3 views    Misc procedure  2. Post concussion syndrome F07.81   3. Dysautonomia ([place]) G90.1    ================================================================= Left arm pain Symptoms are consistent with scapular dyskinesis.  Therapeutic exercises focusing on scapular stabilization and cervical spine mobility and stabilization provided today.  Recommend topical treatments for muscle spasm components as she is not interested in taking any other pharmacologic intervention.  I do think having her continue working with chiropractic treatment including vestibular and craniosacral treatment is appropriate.  She has multiple confounding factors contributing to her current symptoms but ultimately some underlying cervical and craniosacral dysfunction are likely contributing to some of the acute symptoms that she  is having.  Formal physical therapy referral could be considered but we will have her work with a Curator as well  as formal therapeutic exercises reviewed today with the athletic training staff.  The lack of improvement I am happy to see her back but at this time we will plan to see her only as needed.  >50% of this 25 minute visit spent in direct patient counseling and/or coordination of care.  Discussion was focused on education regarding the in discussing the pathoetiology and anticipated clinical course of the above condition.  PROCEDURE NOTE: THERAPEUTIC EXERCISES (97110) 15 minutes spent for Therapeutic exercises as below and as referenced in the AVS. This included exercises focusing on stretching, strengthening, with significant focus on eccentric aspects.  Proper technique shown and discussed handout in great detail with ATC. All questions were discussed and answered.   Long term goals include an improvement in range of motion, strength, endurance as well as avoiding reinjury. Frequency of visits is one time as determined during today's  office visit. Frequency of exercises to be performed is as per handout.  EXERCISES REVIEWED:  Scapular stabilization  intrinsic shoulder and neck flexibility and strengthening  ================================================================= Patient Instructions  Please perform the exercise program that we have prepared for you and gone over in detail on a daily basis.  In addition to the handout you were provided you can access your program through: [email or web address]   Your unique program code is: [email or web address]   Look into Arnica gel for topical use  You can use Topical Magnesium to see if this helps.  Try [email or web address].com as a source of both Arnica and magnesium  I think that working with Dr. Mali McIntyre     ================================================================= No future appointments.  Follow-up: Return in about  6 weeks (around 02/25/2017), or if symptoms worsen or fail to improve.   CMA/ATC served as Education administrator during this visit. History, Physical, and Plan performed by medical provider. Documentation and orders reviewed and attested to.      Teresa Coombs, [place] [facility] Physician

## 2017-01-14 NOTE — Patient Instructions (Addendum)
Please perform the exercise program that we have prepared for you and gone over in detail on a daily basis.  In addition to the handout you were provided you can access your program through: [email or web address]   Your unique program code is: [email or web address]   Look into Arnica gel for topical use  You can use Topical Magnesium to see if this helps.  Try TopicalPR.com as a source of both Arnica and magnesium  I think that working with Dr. Mali McIntyre

## 2017-01-14 NOTE — Telephone Encounter (Signed)
Patient requesting that her X-Ray, MRI (copy) and her office notes from today's visit be printed and ready for pick up so that she can take them to Chiropractor. Call patient to advise once ready for pick up.   I have also provided the patient with the medical records number in case it is required for her to call them instead. Notify patient if that is the only option.

## 2017-01-15 NOTE — Telephone Encounter (Signed)
Disc of xray has been made. She will need to contact the imaging office or ordeing providers office for the MRI. Dr. Paulla Fore, please advise once OV note has been completed.

## 2017-01-19 NOTE — Telephone Encounter (Signed)
Spoke with patient and made her aware that disc with xray images is ready for pick up, waiting for OV note to be completed, and MRI disc needs to be obtained from ordering provider. Pt verbalized understanding.

## 2017-01-25 ENCOUNTER — Telehealth: Payer: Self-pay | Admitting: *Deleted

## 2017-01-25 NOTE — Telephone Encounter (Signed)
Patient came in office and states that she is waiting on an OV notes form her last visit with Dr. Paulla Fore. Patient did pick up her x ray disc. She states that Dr. Paulla Fore could upload the [place] . I told her she may have to come by the office pick up the OV notes. Patient is requesting a call back when they are ready. Her contact number is [phone]. Please advise. Thank you

## 2017-01-27 NOTE — Telephone Encounter (Signed)
Forwarding to Dr. Paulla Fore to advise once OV note is ready for printing.

## 2017-01-28 DIAGNOSIS — G901 Familial dysautonomia [Riley-Day]: Secondary | ICD-10-CM | POA: Insufficient documentation

## 2017-01-28 DIAGNOSIS — M79602 Pain in left arm: Secondary | ICD-10-CM | POA: Insufficient documentation

## 2017-01-28 NOTE — Procedures (Signed)
PROCEDURE NOTE: THERAPEUTIC EXERCISES (97110) 15 minutes spent for Therapeutic exercises as below and as referenced in the AVS. This included exercises focusing on stretching, strengthening, with significant focus on eccentric aspects.  Proper technique shown and discussed handout in great detail with ATC. All questions were discussed and answered.   Long term goals include an improvement in range of motion, strength, endurance as well as avoiding reinjury. Frequency of visits is one time as determined during today's  office visit. Frequency of exercises to be performed is as per handout.  EXERCISES REVIEWED:  Scapular stabilization  intrinsic shoulder and neck flexibility and strengthening

## 2017-01-28 NOTE — Telephone Encounter (Signed)
Called pt and advised.  

## 2017-01-28 NOTE — Assessment & Plan Note (Signed)
Symptoms are consistent with scapular dyskinesis.  Therapeutic exercises focusing on scapular stabilization and cervical spine mobility and stabilization provided today.  Recommend topical treatments for muscle spasm components as she is not interested in taking any other pharmacologic intervention.  I do think having her continue working with chiropractic treatment including vestibular and craniosacral treatment is appropriate.  She has multiple confounding factors contributing to her current symptoms but ultimately some underlying cervical and craniosacral dysfunction are likely contributing to some of the acute symptoms that she is having.  Formal physical therapy referral could be considered but we will have her work with a Curator as well as formal therapeutic exercises reviewed today with the athletic training staff.  The lack of improvement I am happy to see her back but at this time we will plan to see her only as needed.  >50% of this 25 minute visit spent in direct patient counseling and/or coordination of care.  Discussion was focused on education regarding the in discussing the pathoetiology and anticipated clinical course of the above condition.

## 2017-01-28 NOTE — Telephone Encounter (Signed)
I have actually shared the note with her through my chart, it should be available to her.

## 2017-06-21 ENCOUNTER — Encounter: Payer: Self-pay | Admitting: Family Medicine

## 2017-06-21 ENCOUNTER — Ambulatory Visit: Payer: BLUE CROSS/BLUE SHIELD | Admitting: Family Medicine

## 2017-06-21 DIAGNOSIS — F0781 Postconcussional syndrome: Secondary | ICD-10-CM | POA: Diagnosis not present

## 2017-06-21 MED ORDER — HYDROCHLOROTHIAZIDE 25 MG PO TABS: 25.0000 mg | ORAL_TABLET | Freq: Every day | ORAL | Status: DC | PRN

## 2017-06-21 NOTE — Progress Notes (Signed)
She is on a low carb diet in the meantime, with some vegan choices.  Intentional weight loss.  Still on propanolol 34m a day.    She is still using CPAP, compliant, for OSA.    She had seen neuro prev re: post concussion syndrome, with some slow improvement. She can use an adding machine and do her eye makeup.  She still has trouble with balance and light sensitivity.  She still has some memory lapses.  She isn't back to baseline.  She still has trouble with riding in a car at night due to the glare.  She still has anxiety re: driving that continues.    She is wearing glasses today due to light sensitivity/migraine.  Higher dose of magnesium helped with migraines overall.  She can't usually get through the grocery store without some help, partly due to the lights/lighting.    She tried to travel with flying but the whole situation was overstimulating and that was overwhelming for the patient.    When she has a predictable routine at home she does better.    She has been applying for disability.  She was considering f[place] clinic [place]    PMH and SH reviewed  ROS: Per HPI unless specifically indicated in ROS section   Meds, vitals, and allergies reviewed.   GEN: nad, alert and oriented HEENT: mucous membranes moist NECK: supple w/o LA CV: rrr PULM: ctab, no inc wob ABD: soft, +bs EXT: no edema Tearful discussing her situation, but regains composure.   She is walking with a cane.

## 2017-06-21 NOTE — Patient Instructions (Signed)
Check with the eye clinic.  I'll update neurology and we'll go from there.  Take care.  Glad to see you.

## 2017-06-22 NOTE — Assessment & Plan Note (Signed)
She is making slow progress.  She still has significant trouble with light sensitivity, situational anxiety, concentration.  She still has trouble driving, especially at night.  She still has some memory lapses.  We talked about options.  She is making some progress but she is clearly not back to baseline.  I will ask for neurology input about migraine prophylaxis medications given that she has persistent photophobia.  I appreciate the help of all involved. >25 minutes spent in face to face time with patient, >50% spent in counselling or coordination of care.

## 2017-06-28 ENCOUNTER — Telehealth: Payer: Self-pay | Admitting: Family Medicine

## 2017-06-28 NOTE — Telephone Encounter (Signed)
Notify pt.  I talked with neuro- would be reasonable to try amitriptyline 51m at night.  It won't treat the photophobia specifically- nothing will- but it may help with anxiety and headache prevention.  I can send in if needed/desired.  Thanks.

## 2017-06-28 NOTE — Telephone Encounter (Signed)
Patient advised and agrees.  Please send to [facility] [employer] in [place].

## 2017-06-29 MED ORDER — AMITRIPTYLINE HCL 25 MG PO TABS
25.0000 mg | ORAL_TABLET | Freq: Every day | ORAL | 3 refills | Status: DC
Start: 2017-06-29 — End: 2018-03-08

## 2017-06-29 NOTE — Telephone Encounter (Signed)
Sent. Thanks.   

## 2017-08-01 ENCOUNTER — Telehealth: Payer: Self-pay | Admitting: Family Medicine

## 2017-08-01 NOTE — Telephone Encounter (Signed)
I looked at the form that was dropped off.  There is no way to fill this out w/o OV.  I can't fill out the level of detail on the form without talking to the patient.  Thanks.

## 2017-08-02 NOTE — Telephone Encounter (Signed)
Spoke to pt and advised per Dr Damita Dunnings; OV scheduled

## 2017-08-06 ENCOUNTER — Ambulatory Visit: Payer: BLUE CROSS/BLUE SHIELD | Admitting: Family Medicine

## 2017-08-06 ENCOUNTER — Encounter: Payer: Self-pay | Admitting: Family Medicine

## 2017-08-06 DIAGNOSIS — F0781 Postconcussional syndrome: Secondary | ICD-10-CM | POA: Diagnosis not present

## 2017-08-06 NOTE — Progress Notes (Signed)
Post concussion follow up.  She is minimally driving, with a small loop in a small town, not on the highways, during low traffic times.  She still can't even ride in a car at night due to anxiety.  We talked about her anxiety.  She doesn't have as much SOB with episodes in the car now, but isn't back to her baseline.  She is still walking with a cane to reinforce/compensate for her balance deficits, especially with turning.  She has a few episodes per month with tilting to the right, those episodes can self resolve.  "Home is still my security place" since she can control the environment.  She is trying to relearn mechanical tasks/coordination tasks, ie using an adding machine.  She is still easily distracted/overwhelmed with stimuli in public environments (ie noise at the grocery store, changes in lighting, etc). She is globally improved but clearly not back to baseline. D/w pt about her form.   See scanned forms.    She still has migraines with photophobia.    She is going to f/u with [facility] about her LFTs.  I'll defer.  D/w pt.  She agrees.    PMH and SH reviewed  ROS: Per HPI unless specifically indicated in ROS section   Meds, vitals, and allergies reviewed.   GEN: nad, alert and oriented HEENT: mucous membranes moist NECK: supple w/o LA CV: rrr PULM: ctab, no inc wob ABD: soft, +bs EXT: no edema CN 2-12 grossly intact and S/S wnl for the extremities on gross testing but balance was not fully evaluated as she cannot even walk heel-to-toe safely with one step-meaning balance could not be fully assessed at the office visit because her balance was so poor.

## 2017-08-06 NOTE — Patient Instructions (Signed)
Send in the forms and update me as needed.  Plan on recheck in about 6 months.  Update me as needed.  Take care.  Glad to see you.

## 2017-08-08 NOTE — Assessment & Plan Note (Signed)
She is slowly improving but is not at all ready to return to work. Balance was not fully evaluated as she cannot even walk heel-to-toe safely with one step-meaning balance could not be fully assessed at the office visit because her balance was so poor. She is relatively deconditioned and cannot do heavy lifting.  See attached/scanned forms.  Hopefully she will be able return to work in the future but she is not yet at that point.  She still has significant troubles with driving in traffic, riding in a car at night, concentration, attention, dealing with environmental stimuli such as changes in light.  My hope is that she will continue to make some improvement in the next few months.  She will update me as needed. >25 minutes spent in face to face time with patient, >50% spent in counselling or coordination of care.

## 2017-12-22 ENCOUNTER — Telehealth (INDEPENDENT_AMBULATORY_CARE_PROVIDER_SITE_OTHER): Payer: Self-pay | Admitting: Orthopaedic Surgery

## 2017-12-22 NOTE — Telephone Encounter (Signed)
Billing 01/14/2016- 03/25/2016 faxed [facility] [phone]. [facility] called stated they received records but not the billing. Callback [phone]

## 2018-03-08 ENCOUNTER — Ambulatory Visit: Payer: BLUE CROSS/BLUE SHIELD | Admitting: Family Medicine

## 2018-03-08 ENCOUNTER — Encounter: Payer: Self-pay | Admitting: Family Medicine

## 2018-03-08 DIAGNOSIS — F0781 Postconcussional syndrome: Secondary | ICD-10-CM | POA: Diagnosis not present

## 2018-03-08 DIAGNOSIS — K760 Fatty (change of) liver, not elsewhere classified: Secondary | ICD-10-CM | POA: Diagnosis not present

## 2018-03-08 NOTE — Patient Instructions (Addendum)
Call psychology about follow up and see if they have EMDR.   Don't change your meds for now.  Take care.  Glad to see you.

## 2018-03-08 NOTE — Progress Notes (Signed)
Concussion follow up.  She is still using ear plugs and sunglasses to avoid overstimulation as that is distressing.  She has h/o falls and trouble with depth perception, slowly getting better.  Still drops items but less than prev.  She is minimally driving, only short distances to clinic, grocery store.  She hasn't been able to drive on large highways yet.  She is going back for balance therapy in the near future.    She is still overly sensitive to mood changes with stressful stimuli, like watching the news.  She hasn't been able to tolerate the stimuli of going back to church.    She was asking about EMDR and will I gave her contact info for the psychology department.    She cut out dairy and meat.  She limited grains.  Her LFTs improved.   Per EMR- she was discharged from our [facility] as she no longer has evidence of chronic hepatitis.  She'll have routine lab f/u with gyn clinic.  I'll defer.    PMH and SH reviewed  ROS: Per HPI unless specifically indicated in ROS section   Meds, vitals, and allergies reviewed.   GEN: nad, alert and oriented HEENT: mucous membranes moist NECK: supple w/o LA CV: rrr PULM: ctab, no inc wob ABD: soft, +bs EXT: no edema CN 2-12 grossly intact and S/S wnl for the extremities on gross testing but balance was not fully evaluated as she is still walking with a cane.  She is still photosensitive.

## 2018-03-12 DIAGNOSIS — K760 Fatty (change of) liver, not elsewhere classified: Secondary | ICD-10-CM | POA: Insufficient documentation

## 2018-03-12 NOTE — Assessment & Plan Note (Addendum)
She is making slow improvement but she is not yet ready to go back to work.  She still photosensitive.  She still overly sensitive to stressful or even benign stimuli.  She can only drive minimally and not on large highways and not for significant distances.  I filled out her paperwork.  While she is making some improvement at this point she is still appropriate to be out on disability and is not yet ready to go back to work.  She is going to check on EMDR treatment options and I gave her a phone number so she can call the psychology department.  No change in medications at this point.  Update me as needed.  She agrees. >25 minutes spent in face to face time with patient, >50% spent in counselling or coordination of care.

## 2018-03-12 NOTE — Assessment & Plan Note (Signed)
She cut out dairy and meat.  She limited grains.  Her LFTs improved.   Per EMR- she was discharged from our [facility] as she no longer has evidence of chronic hepatitis.  She'll have routine lab f/u with gyn clinic.  I'll defer.  She agrees.

## 2018-03-21 ENCOUNTER — Telehealth: Payer: Self-pay | Admitting: Family Medicine

## 2018-03-21 NOTE — Telephone Encounter (Signed)
[facility] [facility]  [address]  [address] [place]   To sign medicare records release and get records  She is aware hours are 8-5 and to bring picture id

## 2018-03-21 NOTE — Telephone Encounter (Signed)
Pt called office requesting records for the last 3 office visits. Pt stated disability office sent request back in October.

## 2018-05-08 DIAGNOSIS — N390 Urinary tract infection, site not specified: Secondary | ICD-10-CM | POA: Diagnosis not present

## 2018-05-18 DIAGNOSIS — M545 Low back pain: Secondary | ICD-10-CM | POA: Diagnosis not present

## 2018-05-18 DIAGNOSIS — R310 Gross hematuria: Secondary | ICD-10-CM | POA: Diagnosis not present

## 2018-05-19 DIAGNOSIS — N281 Cyst of kidney, acquired: Secondary | ICD-10-CM | POA: Diagnosis not present

## 2018-05-19 DIAGNOSIS — R310 Gross hematuria: Secondary | ICD-10-CM | POA: Diagnosis not present

## 2018-05-19 DIAGNOSIS — R319 Hematuria, unspecified: Secondary | ICD-10-CM | POA: Diagnosis not present

## 2018-05-25 DIAGNOSIS — N281 Cyst of kidney, acquired: Secondary | ICD-10-CM | POA: Diagnosis not present

## 2018-05-25 DIAGNOSIS — N3020 Other chronic cystitis without hematuria: Secondary | ICD-10-CM | POA: Diagnosis not present

## 2018-05-25 DIAGNOSIS — R310 Gross hematuria: Secondary | ICD-10-CM | POA: Diagnosis not present

## 2018-05-26 ENCOUNTER — Ambulatory Visit: Payer: Medicare Other | Admitting: Family Medicine

## 2018-05-26 ENCOUNTER — Encounter: Payer: Self-pay | Admitting: Family Medicine

## 2018-05-26 DIAGNOSIS — F0781 Postconcussional syndrome: Secondary | ICD-10-CM

## 2018-05-26 DIAGNOSIS — R42 Dizziness and giddiness: Secondary | ICD-10-CM

## 2018-05-26 DIAGNOSIS — M545 Low back pain, unspecified: Secondary | ICD-10-CM

## 2018-05-26 MED ORDER — PREDNISONE 10 MG PO TABS: ORAL_TABLET | ORAL | 0 refills | Status: DC

## 2018-05-26 MED ORDER — TIZANIDINE HCL 4 MG PO TABS: 2.0000 mg | ORAL_TABLET | Freq: Four times a day (QID) | ORAL | 0 refills | Status: DC | PRN

## 2018-05-26 NOTE — Progress Notes (Signed)
She had urology follow up yesterday, I am awaiting notes.  Per patient, she had benign findings on scope eval.    H/o concussion.  She is still wearing a hat and glasses when near fluorescent lights.  Vertigo since prev injury, with rolling over in bed, with the room spinning.  She had a bad episode 2 days ago, that clearly got better with sitting up.  Similar to less severe sx last night.  She isn't sure if the spinning always goes the same way.  She was leaning over to pick something up, was already near the ground.  With this she was able to ease down and didn't hit hard.    She has lightheadedness from POTS but that is a separate issue.    She may be going back to [place] for a few months, with family.  She is sad and blue and "low self esteem".  No SI/HI.  Her mood is lower.  She has had a lot of loss (career, etc) with her situation.  She was asking about SSRI use/antidepressant use, but she did not want to make her vertigo situation worse.  Lower back pain yesterday.  She has had R lower back trigger point pain since prev injury.  Unclear if fall yesterday aggravated it.  Pain is radiating down her R leg.  Aleve didn't help.  No L leg pain.  TENS using didn't help.  No B/B sx.  No fevers.  No rash.  No weakness.   Meds, vitals, and allergies reviewed.   ROS: Per HPI unless specifically indicated in ROS section   nad ncat Wearing sunglasses at baseline TM wnl B Neck supple, no LA rrr ctab abd soft, not ttp.  R SLR positive.   Distally normal S/S.   CN 2-12 wnl B, S/S wnl x4

## 2018-05-26 NOTE — Patient Instructions (Signed)
Use the prednisone and tizanidine if needed.  When less back pain, then try the bedside exercise for vertigo.  We can refer you to ENT if needed.  Let me see about options re: antidepressants.  Take care.  Glad to see you.

## 2018-06-01 ENCOUNTER — Encounter: Payer: Self-pay | Admitting: Family Medicine

## 2018-06-01 ENCOUNTER — Ambulatory Visit: Payer: Medicare Other | Admitting: Sports Medicine

## 2018-06-01 ENCOUNTER — Encounter: Payer: Self-pay | Admitting: Sports Medicine

## 2018-06-01 VITALS — HR 54 | Ht 67.5 in | Wt 178.0 lb

## 2018-06-01 DIAGNOSIS — M9903 Segmental and somatic dysfunction of lumbar region: Secondary | ICD-10-CM

## 2018-06-01 DIAGNOSIS — M9905 Segmental and somatic dysfunction of pelvic region: Secondary | ICD-10-CM | POA: Diagnosis not present

## 2018-06-01 DIAGNOSIS — M9904 Segmental and somatic dysfunction of sacral region: Secondary | ICD-10-CM

## 2018-06-01 DIAGNOSIS — M545 Low back pain, unspecified: Secondary | ICD-10-CM

## 2018-06-01 DIAGNOSIS — F0781 Postconcussional syndrome: Secondary | ICD-10-CM

## 2018-06-01 NOTE — Progress Notes (Signed)
[name]. , [facility]  [name] - 53 y.o. female MRN [id number]  Date of birth: 05-20-1965  Visit Date: June 01, 2018  PCP: Tonia Ghent, MD   Referred by: Tonia Ghent, MD  SUBJECTIVE:  Chief Complaint  Patient presents with  . Lower Back - Follow-up    HPI: Patient is here for evaluation of acute on chronic low back pain and right-sided leg pain.  Symptoms have been worsening over the past week when she had a fall.  Unfortunately this is not totally uncommon for her given her underlying medical comorbidities.  She landed directly on her knee without significant trauma but enough that later that day she had worsening back and groin pain.  The pain at this point is severe to the point that she has been seen in the emergency department.  Placed on steroids as well as a muscle relaxer.  Chiropractic treatment provided only minimal improvement as well as her craniocervical treatment provider.  She is having pain that radiates into the right lateral thigh.  Some groin pain.  No significant mechanical symptoms.  REVIEW OF SYSTEMS: Pt denies any change in bowel or bladder habits, muscle weakness, numbness or falls associated with this pain. She is having significant nighttime disturbances.  She has chronic headaches as well as numbness along the right lateral thigh.  HISTORY:  Prior history reviewed and updated per electronic medical record.  Patient Active Problem List   Diagnosis Date Noted  . Fatty liver 03/12/2018  . Left arm pain 01/28/2017  . Dysautonomia ([place]) 01/28/2017  . Post concussion syndrome 05/04/2016  . Anxiety state 01/27/2016  . OSA (obstructive sleep apnea) 10/19/2015    Prev studies: CPAP Titration Study Report Date of Study: 04/28/2015 Respiratory Information: There were 0 obstructive apnea, 0 central apnea, 0 mixed apnea, and 21 hypopneas. The overall AHI was 3.8. SpO2  nadir was 86%. RERAs were noted during titration. CPAP was titrated to 13 cmH2O. At CPAP of 13 cmH2O, AHI was 0 and SpO2 nadir was 95%. The patient had REM sleep in the supine position at this setting. CPAP of 13 cm H2O appears to be adequate.  Periodic Limb Movement of Sleep Information: There were 35 PLMs with PLM index of 6.3. 18 of these were associated with arousals. PLM-arousal index was 3. POLYSOMONGRAPHY IMPRESSION: Compared to the previous sleep study, there is significant improvement of SDB with PAP titration. CPAP was titrated to 13 cmH2O. At CPAP of 13 cmH2O, AHI was 0 and SpO2 nadir was 95%. The patient had REM sleep in the supine position at this setting. CPAP of 13 cm H2O appears to be be adequate.  Mild PLMS was noted with a PLM index of 6.3. The clinical significance of this finding is unclear.  Mildly reduced sleep efficiency with prolonged sleep latency. Normal REM latency. The patient did have sleep onset and sleep maintenance problem during this sleep study. Abnormal sleep architecture with increased stage 1 and 2 sleep, minimal slow wave sleep, and reduced REM sleep. Fragmentation of sleep improved with titration.  EKG demonstrated normal sinus rhythm. RECOMMENDATIONS: Advise to adjust CPAP to auto-CPAP at 11-15 cmH2O or fixed CPAP at 13 cmH2O with EPR of 3 and follow clinical symptoms and CPAP downloads.  A Medium F&P Simplus Full Face Mask was used with heated humidity during this study. Adjust interface and heated humidity as needed.  CPAP compliance should be emphasized  and monitored closely. Obtain more history regarding RLS. Clinical correlation is needed.   Diagnostic Sleep Study Report Date of Study: 04/02/2015 ESS=13 POLYSOMONGRAPHY IMPRESSION: Mild obstructive sleep apnea with an overall AHI of 12.7. SpO2 nadir was 83%. The respiratory events were clearly worse in REM sleep and in supine sleep. Mild PLMS was noted with a PLM index of 5.5. The clinical significance  of this finding is unclear.  Normal sleep efficiency with slightly prolonged sleep latency. Slightly prolonged REM latency. The patient did have sleep onset problem during this sleep study. Abnormal sleep architecture with increased stage 1 and stage 2 sleep, reduced slow wave sleep, and reduced REM sleep. Fragmentation of sleep due to respiratory arousals. EKG demonstrated normal sinus rhythm. RECOMMENDATIONS: Advise to proceed with a CPAP titration study to optimize therapy.     . Advance care planning 10/03/2014    Living will d/w pt. Husband designated if patient were incapacitated.    . Hyperglycemia 09/05/2014  . Routine general medical examination at a health care facility 03/30/2013  . POTS (postural orthostatic tachycardia syndrome) 04/08/2011    Per Dr. Lurene Shadow at [facility]   . SOB (shortness of breath) on exertion 12/25/2010  . REACTION, ACUTE STRESS W/EMOTIONAL DSTURB 09/09/2006    Qualifier: Diagnosis of  By: Glori Bickers MD, [place] HYPERTENSION 09/09/2006    Qualifier: Diagnosis of  By: Glori Bickers MD, [place] deficiency 09/08/2006    Qualifier: History of  By: Marcelino Scot CMA, [place]     . HYPERLIPIDEMIA 09/08/2006    Qualifier: Diagnosis of  By: Marcelino Scot CMA, [place]     . SLEEP APNEA, OBSTRUCTIVE, MILD 09/08/2006    Qualifier: Diagnosis of  By: Marcelino Scot CMA, [place]     . MIGRAINE HEADACHE 09/08/2006    Qualifier: History of  By: Marcelino Scot CMA, [place]     . GERD 09/08/2006    Qualifier: Diagnosis of  By: Marcelino Scot CMA, [place]     . URINARY INCONTINENCE, MIXED 09/08/2006    Qualifier: History of  By: Marcelino Scot CMA, [place]      Social History   Occupational History    Comment: [employer]  Tobacco Use  . Smoking status: Never Smoker  . Smokeless tobacco: Never Used  Substance and Sexual Activity  . Alcohol use: No    Alcohol/week: 0.0 standard drinks  . Drug use: No  . Sexual  activity: Not on file   Social History   Social History Narrative   Married   No caffeine    OBJECTIVE:  VS:  HT:5' 7.5" (171.5 cm)   WT:178 lb (80.7 kg)  BMI:27.45    BP:   HR:(!) 54bpm  TEMP: ( )  RESP:97 %   PHYSICAL EXAM: Adult female. No acute respiratory distress.  Alert and appropriate. Patient is laying on the table when I first come in due to significant back pain.  She has pain that localizes to the right paralumbar region as well as into the right groin.  She has negative neural tension testing as well as intact lower extremity myotomal strength of the functional disinhibition is present that improved with osteopathic manipulation.  Normal sensation with the exception of over the right greater trochanter   ASSESSMENT:  1. Acute low back pain, unspecified back pain laterality, unspecified whether sciatica present   2. Somatic dysfunction of lumbar region   3. Somatic dysfunction of pelvis region   4. Somatic dysfunction  of sacral region   5. Post concussion syndrome     PROCEDURES:  PROCEDURE NOTE: OSTEOPATHIC MANIPULATION  The decision today to treat with Osteopathic Manipulative Therapy (OMT) was based on physical exam findings. Verbal consent was obtained following a discussion with the patient regarding the of risks, benefits and potential side effects, including an acute pain flare,post manipulation soreness and need for repeat treatments.  Contraindications to OMT: NONE Manipulation was performed as below: [facility] Treated & Osteopathic Exam Findings LUMBAR SPINE:  L3 FRS RIGHT (Flexed, Rotated & Sidebent) PELVIS:  Right psoas spasm Right anterior innonimate SACRUM:  L on L sacral torsion  OMT Techniques Used: HVLA muscle energy myofascial release HVLA - Long Lever The patient tolerated the treatment well and reported Improved symptoms following treatment today. Patient was given medications, exercises, stretches and lifestyle modifications per AVS and  verbally.    PROCEDURE NOTE: THERAPEUTIC EXERCISES [phone])  Discussed the foundation of treatment for this condition is physical therapy and/or daily (5-6 days/week) therapeutic exercises, focusing on core strengthening, coordination, neuromuscular control/reeducation. 15 minutes spent for Therapeutic exercises as below and as referenced in the AVS. This included exercises focusing on stretching, strengthening, with significant focus on eccentric aspects.  Proper technique shown and discussed handout in great detail with ATC. All questions were discussed and answered.  Long term goals include an improvement in range of motion, strength, endurance as well as avoiding reinjury. Frequency of visits is one time as determined during today's office visit. Frequency of exercises to be performed is as per handout. EXERCISES REVIEWED: Archie Balboa Exercises Pelvic recruitment/tilting Hip flexor stretching     PLAN:  Pertinent additional documentation may be included in corresponding procedure notes, imaging studies, problem based documentation and patient instructions.  Ultimately her right hip suffered a direct jamming mechanism which did cause some irritation in the right SI joint and right hip joint that responded well to direct osteopathic manipulation in these areas.  She had overall great improvement with her symptoms immediately following this.  Her symptoms are legitimate however there is some symptom magnification due to her underlying stress levels and she is aware of this.  She has underlying hip flexor tightness at baseline.  This needs to be addressed going forward and information regarding foundations training as well as home therapeutic exercises provided today.  She is returning to [place] at this time and I am happy to see her back at any point but will defer to ongoing care down there.  Links to [employer] provided today per Patient Instructions.  These exercises were  developed by Minerva Ends, DC with a strong emphasis on core neuromuscular reducation and postural realignment through body-weight exercises.  Home Therapeutic exercises prescribed today per procedure note.  Discussed the underlying features of tight hip flexors leading to crouched, fetal like position that results in spinal column compression.  Including lumbar hyperflexion with hypermobility, thoracic flexion with restrictive rotation and cervical lordosis reversal.   Osteopathic manipulation was performed today based on physical exam findings.  Patient has responded well to osteopathic manipulation previously the prior manipulation did not provide permanent long lasting relief.  The patient does feel as though there was significant benefit to the prior manipulation and they wished for repeat manipulation today.  They understand that home therapeutic exercises are critical part of the healing/treatment process and will continue with self treatment between now and their next visit as outlined.  The patient understands that the frequency of visits is meant to provide  a stimulus to promote the body's own ability to heal and is not meant to be the sole means for improvement in their symptoms.  Activity modifications and the importance of avoiding exacerbating activities (limiting pain to no more than a 4 / 10 during or following activity) recommended and discussed.  Discussed red flag symptoms that warrant earlier emergent evaluation and patient voices understanding.   No orders of the defined types were placed in this encounter.  Lab Orders  No laboratory test(s) ordered today   Imaging Orders  No imaging studies ordered today   Referral Orders  No referral(s) requested today    At follow up will plan to consider: repeat osteopathic manipulation  Return if symptoms worsen or fail to improve.          Gerda Diss, [facility] Physician

## 2018-06-01 NOTE — Patient Instructions (Addendum)
Also check out [employer]" which is a program developed by Dr. Minerva Ends.   There are links to a couple of his YouTube Videos below and I would like to see you performing one of his videos 5-6 days per week.  It is best to do these exercises first thing in the morning.  They will give you a good jumpstart here today and start normalizing the way you move.  A good intro video is: "Independence from Pain 7-minute Video" - [email or web address]   A more advanced video is: Interior and spatial designer original 12 minutes" - [email or web address]-[email or web address]/  Exercises that focus more on the neck are as below: Dr. Archie Balboa with [place] teaching neck and shoulder details Part 1 - https://youtu.be/[email or web address] Part 2 Dr. Archie Balboa with [facility] quick routine to practice daily - https://youtu.be/[email or web address]  Do not try to attempt the entire video when first beginning.  Try breaking of each exercise that he goes into shorter segments.  In other words, if they perform an exercise for 45 seconds, start with 15 seconds and rest and then resume when they begin the new activity.  If you work your way up to being able to do these videos without having to stop, I expect you will see significant improvements in your pain.  If you enjoy his videos and would like to find out more you can look on his website: [email or web address]  He has a workout streaming option as well as a DVD set available for purchase.  Amazon has the best price for his DVDs.     Please perform the exercise program that we have prepared for you and gone over in detail on a daily basis.  In addition to the handout you were provided you can access your program through: [email or web address]-[email or web address]   Your unique program code is:  [facility]

## 2018-06-02 ENCOUNTER — Other Ambulatory Visit: Payer: Self-pay | Admitting: Family Medicine

## 2018-06-02 DIAGNOSIS — R42 Dizziness and giddiness: Secondary | ICD-10-CM | POA: Insufficient documentation

## 2018-06-02 NOTE — Assessment & Plan Note (Signed)
She can try bedside exercises as her back pain allows.  Discussed with patient. >25 minutes spent in face to face time with patient, >50% spent in counselling or coordination of care.

## 2018-06-02 NOTE — Telephone Encounter (Signed)
Left detailed message on voicemail to call in and leave message as to which pharmacy, if she is willing to try the medication.

## 2018-06-02 NOTE — Telephone Encounter (Signed)
Patient sent MyChart message signifying receipt of my VM and states she prefers to try a little longer before starting the Lexapro because of the dizziness risks.

## 2018-06-02 NOTE — Telephone Encounter (Signed)
Call patient.  She can try Lexapro.  All of the antidepressants have a possibility of vertigo as a side effect but this is 1 of the least offensive medications I can think of.  It would be worth starting at 5 mg a day if she is willing to start it.  She was originally planning on traveling to [place] but I do not think she has left town yet.  Please let me know which pharmacy she wants to use.  Thanks.

## 2018-06-02 NOTE — Assessment & Plan Note (Signed)
See notes on follow-up phone note.  It may be that Lexapro would be a reasonable option to try for her mood.

## 2018-06-02 NOTE — Assessment & Plan Note (Signed)
Use prednisone with routine steroid cautions and tizanidine if needed.  Discussed with patient.  She agrees.

## 2018-06-09 ENCOUNTER — Encounter: Payer: Self-pay | Admitting: Sports Medicine

## 2018-09-10 ENCOUNTER — Encounter: Payer: Self-pay | Admitting: Family Medicine

## 2018-10-20 ENCOUNTER — Encounter: Payer: Self-pay | Admitting: Family Medicine

## 2018-11-15 ENCOUNTER — Telehealth: Payer: Self-pay | Admitting: Family Medicine

## 2018-11-15 NOTE — Telephone Encounter (Signed)
Spouse dropped off disability parking placard In [address] tower up front

## 2018-11-15 NOTE — Telephone Encounter (Signed)
Placed form in Dr. Josefine Class box

## 2018-11-16 DIAGNOSIS — Z1231 Encounter for screening mammogram for malignant neoplasm of breast: Secondary | ICD-10-CM | POA: Diagnosis not present

## 2018-11-16 DIAGNOSIS — N958 Other specified menopausal and perimenopausal disorders: Secondary | ICD-10-CM | POA: Diagnosis not present

## 2018-11-16 LAB — HM DEXA SCAN: HM Dexa Scan: NORMAL

## 2018-11-16 LAB — HM MAMMOGRAPHY

## 2018-11-16 NOTE — Telephone Encounter (Signed)
Form done. Thanks.

## 2018-11-17 NOTE — Telephone Encounter (Signed)
Patient advised. Patient's husband is coming for his appointment today and will pick this form up then

## 2018-12-14 DIAGNOSIS — R6889 Other general symptoms and signs: Secondary | ICD-10-CM | POA: Diagnosis not present

## 2019-02-02 ENCOUNTER — Encounter: Payer: Self-pay | Admitting: Family Medicine

## 2019-02-07 ENCOUNTER — Ambulatory Visit: Payer: Self-pay | Admitting: Family Medicine

## 2019-02-07 ENCOUNTER — Ambulatory Visit (INDEPENDENT_AMBULATORY_CARE_PROVIDER_SITE_OTHER): Payer: Medicare Other | Admitting: Family Medicine

## 2019-02-07 DIAGNOSIS — F0781 Postconcussional syndrome: Secondary | ICD-10-CM

## 2019-02-07 NOTE — Progress Notes (Signed)
Virtual visit completed through WebEx or similar program Patient location: home  Provider location: Financial controller at [facility], office   Pandemic considerations d/w pt.   Limitations and rationale for visit method d/w patient.  Patient agreed to proceed.   CC: follow up.    HPI:  She was approved for disability.  D/w pt.  She isn't able to work at this point due to ongoing sx.  She still has not been on SSRI.  She can tolerate a little more distraction now but she isn't able to work. She can tolerate being around others and in a conversation a little more than previous, but clearly isn't back to normal.  She still has to work extra hard to concentrate, esp with reading.  She still has a lot of anxiety, esp with travel.  She is still a fall risk with balance troubles and photosensitivity.  She can't tolerate going to church (even with ear plugs and sunglasses) due to music/sound and lights.  Fall cautions d/w pt.  She has been able to go to the grocery store rarely, with sig effort on her part.  She is still bothered especially by fluorescent lights.  She found an EMDR clinic and she is going to f/u with that.  She has been working on core exercises to help with balance.    She was "pretty blue this winter but then I think about where I could be and I'm learning to live in the skin that I'm in."     Not able to work now.  She has f/u with [facility] next week.  She has been keep a log of her BP.  I will await the notes from the [facility] clinic.  She agrees.  Meds and allergies reviewed.   ROS: Per HPI unless specifically indicated in ROS section   NAD Speech wnl  A/P:  Postconcussive syndrome.  Unfortunately the patient continues to have significant symptoms and had to apply for disability.  We talked about her situation, with the good news being that she was approved for disability, with the unfortunate news being that she needed the disability approval in the first place.  She still cannot drive on  her own in a way that is normal and would allow her to work.  She still has difficulties with noise and light sensitivity.  She is trying to gradually increase her ability to concentrate and she is trying to read some but she still has significant impairment in all of that and it is not appropriate at this point for the patient to try to work.  She agrees.  She is going to follow-up with EMDR clinic in [place].  She will let me know how that goes.  I will await her notes from [place] in the meantime.  We will go from there and recheck episodically.  She agrees.  She has made some progress and I did applaud her effort.  >25 minutes spent in face to face time with patient, >50% spent in counselling or coordination of care.

## 2019-02-08 ENCOUNTER — Encounter: Payer: Self-pay | Admitting: Family Medicine

## 2019-02-08 NOTE — Assessment & Plan Note (Signed)
Postconcussive syndrome.  Unfortunately the patient continues to have significant symptoms and had to apply for disability.  We talked about her situation, with the good news being that she was approved for disability, with the unfortunate news being that she needed the disability approval in the first place.  She still cannot drive on her own in a way that is normal and would allow her to work.  She still has difficulties with noise and light sensitivity.  She is trying to gradually increase her ability to concentrate and she is trying to read some but she still has significant impairment in all of that and it is not appropriate at this point for the patient to try to work.  She agrees.  She is going to follow-up with EMDR clinic in [place].  She will let me know how that goes.  I will await her notes from [place] in the meantime.  We will go from there and recheck episodically.  She agrees.  She has made some progress and I did applaud her effort.  >25 minutes spent in face to face time with patient, >50% spent in counselling or coordination of care.

## 2019-02-13 ENCOUNTER — Ambulatory Visit: Payer: Self-pay | Admitting: Family Medicine

## 2019-02-27 ENCOUNTER — Encounter: Payer: Self-pay | Admitting: Family Medicine

## 2019-03-31 ENCOUNTER — Encounter: Payer: Self-pay | Admitting: Family Medicine

## 2019-03-31 DIAGNOSIS — I498 Other specified cardiac arrhythmias: Secondary | ICD-10-CM

## 2019-03-31 DIAGNOSIS — G90A Postural orthostatic tachycardia syndrome (POTS): Secondary | ICD-10-CM

## 2019-04-02 NOTE — Telephone Encounter (Signed)
Referral placed.  Routed to [facility].   Please call pt.  Thanks.

## 2019-04-03 ENCOUNTER — Encounter: Payer: Self-pay | Admitting: Family Medicine

## 2019-04-12 ENCOUNTER — Ambulatory Visit: Payer: Medicare Other | Admitting: Cardiovascular Disease

## 2019-04-12 ENCOUNTER — Encounter: Payer: Self-pay | Admitting: Cardiovascular Disease

## 2019-04-12 DIAGNOSIS — R072 Precordial pain: Secondary | ICD-10-CM | POA: Diagnosis not present

## 2019-04-12 DIAGNOSIS — G90A Postural orthostatic tachycardia syndrome (POTS): Secondary | ICD-10-CM

## 2019-04-12 DIAGNOSIS — G4733 Obstructive sleep apnea (adult) (pediatric): Secondary | ICD-10-CM | POA: Diagnosis not present

## 2019-04-12 DIAGNOSIS — I498 Other specified cardiac arrhythmias: Secondary | ICD-10-CM | POA: Diagnosis not present

## 2019-04-12 NOTE — Patient Instructions (Signed)
Medication Instructions:  Your physician recommends that you continue on your current medications as directed. Please refer to the Current Medication list given to you today.  If you need a refill on your cardiac medications before your next appointment, please call your pharmacy.   Lab work: NONE  Testing/Procedures: Coronary Calcium Score  Follow-Up: At [facility], you and your health needs are our priority.  As part of our continuing mission to provide you with exceptional heart care, we have created designated Provider Care Teams.  These Care Teams include your primary Cardiologist (physician) and Advanced Practice Providers (APPs -  Physician Assistants and Nurse Practitioners) who all work together to provide you with the care you need, when you need it. You may see Dr Gwenlyn Found or one of the following Advanced Practice Providers on your designated Care Team:    Kerin Ransom, PA-C  [place], [place]  Coletta Memos, [place]  Your physician wants you to follow-up as needed.  Any Other Special Instructions Will Be Listed Below (If Applicable).   Coronary Calcium Scan A coronary calcium scan is an imaging test used to look for deposits of calcium and other fatty materials (plaques) in the inner lining of the blood vessels of the heart (coronary arteries). These deposits of calcium and plaques can partly clog and narrow the coronary arteries without producing any symptoms or warning signs. This puts a person at risk for a heart attack. This test can detect these deposits before symptoms develop. Tell a health care provider about:  Any allergies you have.  All medicines you are taking, including vitamins, herbs, eye drops, creams, and over-the-counter medicines.  Any problems you or family members have had with anesthetic medicines.  Any blood disorders you have.  Any surgeries you have had.  Any medical conditions you have.  Whether you are pregnant or may be pregnant. What  are the risks? Generally, this is a safe procedure. However, problems may occur, including:  Harm to a pregnant woman and her unborn baby. This test involves the use of radiation. Radiation exposure can be dangerous to a pregnant woman and her unborn baby. If you are pregnant, you generally should not have this procedure done.  Slight increase in the risk of cancer. This is because of the radiation involved in the test. What happens before the procedure? No preparation is needed for this procedure. What happens during the procedure?   You will undress and remove any jewelry around your neck or chest.  You will put on a hospital gown.  Sticky electrodes will be placed on your chest. The electrodes will be connected to an electrocardiogram (ECG) machine to record a tracing of the electrical activity of your heart.  A CT scanner will take pictures of your heart. During this time, you will be asked to lie still and hold your breath for 2-3 seconds while a picture of your heart is being taken. The procedure may vary among health care providers and hospitals. What happens after the procedure?  You can get dressed.  You can return to your normal activities.  It is up to you to get the results of your test. Ask your health care provider, or the department that is doing the test, when your results will be ready. Summary  A coronary calcium scan is an imaging test used to look for deposits of calcium and other fatty materials (plaques) in the inner lining of the blood vessels of the heart (coronary arteries).  Generally, this is  a safe procedure. Tell your health care provider if you are pregnant or may be pregnant.  No preparation is needed for this procedure.  A CT scanner will take pictures of your heart.  You can return to your normal activities after the scan is done. This information is not intended to replace advice given to you by your health care provider. Make sure you discuss  any questions you have with your health care provider. Document Released: 09/26/2007 Document Revised: 03/12/2017 Document Reviewed: 02/17/2016 Elsevier Patient Education  2020 [employer].

## 2019-04-12 NOTE — Assessment & Plan Note (Signed)
History of mild hyperlipidemia with lipid profile performed 11/16/2018 revealing a total cholesterol of 205, LDL 123 and HDL 49.  She is apparently intolerant to statin therapy.  If her coronary calcium score is elevated we will consider alternative methods of lowering her cholesterol.
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
## 2019-04-12 NOTE — Assessment & Plan Note (Signed)
History of atypical chest pain with a clean cardiac cath which I performed 12/19/2010.  She does have a family history of heart disease.  Going to get a coronary calcium score to further evaluate

## 2019-04-12 NOTE — Assessment & Plan Note (Signed)
History of POTS on propranolol followed by [facility] POTS clinic (Dr. Lurene Shadow).

## 2019-04-12 NOTE — Assessment & Plan Note (Signed)
History of obstructive sleep apnea on CPAP. 

## 2019-04-14 DIAGNOSIS — G459 Transient cerebral ischemic attack, unspecified: Secondary | ICD-10-CM

## 2019-04-14 HISTORY — DX: Transient cerebral ischemic attack, unspecified: G45.9

## 2019-04-25 DIAGNOSIS — Z85828 Personal history of other malignant neoplasm of skin: Secondary | ICD-10-CM | POA: Diagnosis not present

## 2019-04-25 DIAGNOSIS — D3617 Benign neoplasm of peripheral nerves and autonomic nervous system of trunk, unspecified: Secondary | ICD-10-CM | POA: Diagnosis not present

## 2019-04-25 DIAGNOSIS — L918 Other hypertrophic disorders of the skin: Secondary | ICD-10-CM | POA: Diagnosis not present

## 2019-04-25 DIAGNOSIS — L821 Other seborrheic keratosis: Secondary | ICD-10-CM | POA: Diagnosis not present

## 2019-04-25 DIAGNOSIS — L812 Freckles: Secondary | ICD-10-CM | POA: Diagnosis not present

## 2019-04-25 DIAGNOSIS — D3612 Benign neoplasm of peripheral nerves and autonomic nervous system, upper limb, including shoulder: Secondary | ICD-10-CM | POA: Diagnosis not present

## 2019-05-01 ENCOUNTER — Observation Stay
Admission: EM | Admit: 2019-05-01 | Discharge: 2019-05-02 | Disposition: A | Discharge status: Home / Self Care | Payer: Medicare Other | Attending: Internal Medicine | Admitting: Internal Medicine

## 2019-05-01 ENCOUNTER — Emergency Department: Payer: Medicare Other

## 2019-05-01 ENCOUNTER — Other Ambulatory Visit: Payer: Self-pay

## 2019-05-01 DIAGNOSIS — G459 Transient cerebral ischemic attack, unspecified: Principal | ICD-10-CM | POA: Insufficient documentation

## 2019-05-01 DIAGNOSIS — R55 Syncope and collapse: Secondary | ICD-10-CM

## 2019-05-01 DIAGNOSIS — R29810 Facial weakness: Secondary | ICD-10-CM | POA: Diagnosis not present

## 2019-05-01 DIAGNOSIS — I10 Essential (primary) hypertension: Secondary | ICD-10-CM | POA: Diagnosis not present

## 2019-05-01 DIAGNOSIS — K760 Fatty (change of) liver, not elsewhere classified: Secondary | ICD-10-CM | POA: Diagnosis not present

## 2019-05-01 DIAGNOSIS — K7581 Nonalcoholic steatohepatitis (NASH): Secondary | ICD-10-CM | POA: Insufficient documentation

## 2019-05-01 DIAGNOSIS — G4733 Obstructive sleep apnea (adult) (pediatric): Secondary | ICD-10-CM

## 2019-05-01 DIAGNOSIS — Z03818 Encounter for observation for suspected exposure to other biological agents ruled out: Secondary | ICD-10-CM | POA: Diagnosis not present

## 2019-05-01 DIAGNOSIS — Z87820 Personal history of traumatic brain injury: Secondary | ICD-10-CM | POA: Insufficient documentation

## 2019-05-01 DIAGNOSIS — R29818 Other symptoms and signs involving the nervous system: Secondary | ICD-10-CM | POA: Diagnosis not present

## 2019-05-01 DIAGNOSIS — Z20822 Contact with and (suspected) exposure to covid-19: Secondary | ICD-10-CM | POA: Insufficient documentation

## 2019-05-01 DIAGNOSIS — E119 Type 2 diabetes mellitus without complications: Secondary | ICD-10-CM | POA: Diagnosis not present

## 2019-05-01 DIAGNOSIS — K7460 Unspecified cirrhosis of liver: Secondary | ICD-10-CM | POA: Insufficient documentation

## 2019-05-01 DIAGNOSIS — R260 Ataxic gait: Secondary | ICD-10-CM | POA: Diagnosis not present

## 2019-05-01 DIAGNOSIS — R200 Anesthesia of skin: Secondary | ICD-10-CM | POA: Diagnosis not present

## 2019-05-01 DIAGNOSIS — E785 Hyperlipidemia, unspecified: Secondary | ICD-10-CM | POA: Diagnosis not present

## 2019-05-01 DIAGNOSIS — I6523 Occlusion and stenosis of bilateral carotid arteries: Secondary | ICD-10-CM | POA: Diagnosis not present

## 2019-05-01 LAB — CBC WITH DIFFERENTIAL/PLATELET
Abs Immature Granulocytes: 0.01 10*3/uL (ref 0.00–0.07)
Basophils Absolute: 0 10*3/uL (ref 0.0–0.1)
Basophils Relative: 1 %
Eosinophils Absolute: 0.1 10*3/uL (ref 0.0–0.5)
Eosinophils Relative: 2 %
HCT: 44.8 % (ref 36.0–46.0)
Hemoglobin: 15 g/dL (ref 12.0–15.0)
Immature Granulocytes: 0 %
Lymphocytes Relative: 43 %
Lymphs Abs: 2.7 10*3/uL (ref 0.7–4.0)
MCH: 30.4 pg (ref 26.0–34.0)
MCHC: 33.5 g/dL (ref 30.0–36.0)
MCV: 90.9 fL (ref 80.0–100.0)
Monocytes Absolute: 0.4 10*3/uL (ref 0.1–1.0)
Monocytes Relative: 6 %
Neutro Abs: 3 10*3/uL (ref 1.7–7.7)
Neutrophils Relative %: 48 %
Platelets: 253 10*3/uL (ref 150–400)
RBC: 4.93 MIL/uL (ref 3.87–5.11)
RDW: 13.2 % (ref 11.5–15.5)
WBC: 6.1 10*3/uL (ref 4.0–10.5)
nRBC: 0 % (ref 0.0–0.2)

## 2019-05-01 LAB — COMPREHENSIVE METABOLIC PANEL
ALT: 32 U/L (ref 0–44)
AST: 40 U/L (ref 15–41)
Albumin: 5.2 g/dL — ABNORMAL HIGH (ref 3.5–5.0)
Alkaline Phosphatase: 42 U/L (ref 38–126)
Anion gap: 9 (ref 5–15)
BUN: 12 mg/dL (ref 6–20)
CO2: 29 mmol/L (ref 22–32)
Calcium: 10 mg/dL (ref 8.9–10.3)
Chloride: 103 mmol/L (ref 98–111)
Creatinine, Ser: 0.67 mg/dL (ref 0.44–1.00)
GFR calc Af Amer: >60 mL/min (ref >60)
GFR calc non Af Amer: >60 mL/min (ref >60)
Glucose, Bld: 161 mg/dL — ABNORMAL HIGH (ref 70–99)
Potassium: 4.1 mmol/L (ref 3.5–5.1)
Sodium: 141 mmol/L (ref 135–145)
Total Bilirubin: 1.6 mg/dL — ABNORMAL HIGH (ref 0.3–1.2)
Total Protein: 8.2 g/dL — ABNORMAL HIGH (ref 6.5–8.1)

## 2019-05-01 LAB — TROPONIN I (HIGH SENSITIVITY): Troponin I (High Sensitivity): <2 ng/L (ref <18)

## 2019-05-01 LAB — GLUCOSE, CAPILLARY: Glucose-Capillary: 207 mg/dL — ABNORMAL HIGH (ref 70–99)

## 2019-05-01 MED ORDER — IOHEXOL 350 MG/ML SOLN
100.0000 mL | Freq: Once | INTRAVENOUS | Status: AC | PRN
  Administered 2019-05-01: 100 mL via INTRAVENOUS

## 2019-05-01 MED ORDER — ACETAMINOPHEN 325 MG PO TABS: 650.0000 mg | ORAL_TABLET | ORAL | Status: DC | PRN

## 2019-05-01 MED ORDER — ACETAMINOPHEN 160 MG/5ML PO SOLN
650.0000 mg | ORAL | Status: DC | PRN
  Filled 2019-05-01: qty 20.3

## 2019-05-01 MED ORDER — ENOXAPARIN SODIUM 40 MG/0.4ML ~~LOC~~ SOLN: 40.0000 mg | SUBCUTANEOUS | Status: DC

## 2019-05-01 MED ORDER — STROKE: EARLY STAGES OF RECOVERY BOOK: Freq: Once | Status: DC

## 2019-05-01 MED ORDER — ACETAMINOPHEN 650 MG RE SUPP: 650.0000 mg | RECTAL | Status: DC | PRN
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
## 2019-05-01 NOTE — ED Triage Notes (Signed)
Pt states she has a sensation from the back of her head to the front. Pt has a hx of a TBI. Pt states she felt like she had numbness to the left side of her face dizziness. Pt has a mild left side facial droop.

## 2019-05-01 NOTE — ED Notes (Signed)
Pt repositioned in bed, no further needs at this time

## 2019-05-01 NOTE — ED Notes (Signed)
Patient transported to CT 

## 2019-05-01 NOTE — ED Notes (Signed)
Pt on phone with husband, speech wnl at this time, no distress.
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
## 2019-05-01 NOTE — ED Provider Notes (Signed)
[facility] Emergency Department Provider Note ____________________________________________   First MD Initiated Contact with Patient 05/01/19 2005     (approximate)  I have reviewed the triage vital signs and the nursing notes.   HISTORY  Chief Complaint Code Stroke    HPI [name] is a 54 y.o. female with PMH as noted below who presents with dizziness and numbness, acute onset after she had just eaten a meal and was leaving a restaurant.  She states initially she felt dizzy and like she would pass out, but also with some spinning.  She began to feel hot and nauseated.  She then developed a feeling of coldness on the entire left side of her body.  This has now resolved although the patient still has intermittent dizziness and nausea.  She denies any prior history of similar episodes.  She denies any weakness, but states that during the initial episode she felt like it was difficult to understand what people were saying to her, difficult to speak.  Past Medical History:  Diagnosis Date  . B12 deficiency   . Cirrhosis of liver not due to alcohol [facility])    tx at [facility]  . Compression fracture of cervical vertebra (HCC)    C5-6  . Diabetes mellitus    DIET CONTROLLED  . Diverticulitis    04/2008  . Dysautonomia ([place])   . Dysrhythmia    POTS (postural orthostatic tachycardia syndrome)  . Fatty liver   . GERD (gastroesophageal reflux disease)   . Hyperlipidemia   . Hypertension   . Hyperthyroidism    f/u testing neg  . Kidney cyst, acquired    benign "lesion"  . Mononucleosis 2005  . NASH (nonalcoholic steatohepatitis)   . Neuromuscular disorder ([place])    DR LOVE HAD 1 EPISODE 2010 INVOL MOVEMENT  LT ARM   . OSA (obstructive sleep apnea)   . Pneumonia    HX PNEUMONIA   . Pneumonia   . PONV (postoperative nausea and vomiting)    1994 HYST  WOKE UP    . POTS (postural orthostatic tachycardia syndrome)   . RLS (restless legs  syndrome)   . Seizures ([place])   . Sleep apnea   . Steatosis of liver   . Thyroid nodule   . Treadmill stress test negative for angina pectoris 01/2000    Patient Active Problem List   Diagnosis Date Noted  . Vertigo 06/02/2018  . Fatty liver 03/12/2018  . Left arm pain 01/28/2017  . Dysautonomia ([place]) 01/28/2017  . Post concussion syndrome 05/04/2016  . Anxiety state 01/27/2016  . OSA (obstructive sleep apnea) 10/19/2015  . Advance care planning 10/03/2014  . Hyperglycemia 09/05/2014  . Atypical chest pain 08/06/2014  . Routine general medical examination at a health care facility 03/30/2013  . Lower back pain 03/16/2012  . POTS (postural orthostatic tachycardia syndrome) 04/08/2011  . SOB (shortness of breath) on exertion 12/25/2010  . REACTION, ACUTE STRESS W/EMOTIONAL DSTURB 09/09/2006  . ESSENTIAL HYPERTENSION 09/09/2006  . B12 deficiency 09/08/2006  . HYPERLIPIDEMIA 09/08/2006  . SLEEP APNEA, OBSTRUCTIVE, MILD 09/08/2006  . MIGRAINE HEADACHE 09/08/2006  . GERD 09/08/2006  . URINARY INCONTINENCE, MIXED 09/08/2006    Past Surgical History:  Procedure Laterality Date  . ABDOMINAL HYSTERECTOMY    . APPENDECTOMY    . BLADDER REPAIR  2003  . CARDIAC CATHETERIZATION     12/2010 with Dr. Gwenlyn Found, normal  . CARDIOVASCULAR STRESS TEST  07/2006   Negative, (equivocal), increased  uptake of radiotracer mid back  . CARDIOVASCULAR STRESS TEST     11/2010, no ischemia  . CHOLECYSTECTOMY     04/14/12   IN [facility]  . COLONOSCOPY WITH PROPOFOL N/A 11/05/2014   Procedure: COLONOSCOPY WITH PROPOFOL;  Surgeon: Lollie Sails, MD;  Location: [facility] ENDOSCOPY;  Service: Endoscopy;  Laterality: N/A;  . DOPPLER ECHOCARDIOGRAPHY     small effusion, but normal o/w  . ESOPHAGOGASTRODUODENOSCOPY  03/2000   Negative  . LUMBAR LAMINECTOMY/DECOMPRESSION MICRODISCECTOMY  04/20/2012   Procedure: LUMBAR LAMINECTOMY/DECOMPRESSION MICRODISCECTOMY 1 LEVEL;  Surgeon: Winfield Cunas, MD;   Location: [facility] NEURO ORS;  Service: Neurosurgery;  Laterality: Left;  LEFT Lumbar five-Sacral one diskectomy  . pubic vaginal sling     2002, 2006  . Stress cardiolite  12/2002   Negative  . TONSILLECTOMY    . TONSILLECTOMY    . US abdominal  08/2003 & 01/2005   Fatty liver (both times)    Prior to Admission medications   Medication Sig Start Date End Date Taking? Authorizing Provider  Alum & Mag Hydroxide-Simeth (MAG-AL PLUS PO) Take 1 tablet by mouth daily.   Yes [provider]  Barberry-Oreg Grape-Goldenseal (BERBERINE COMPLEX PO) Take 1 capsule by mouth daily.   Yes [provider]  cholecalciferol (VITAMIN D3) 25 MCG (1000 UNIT) tablet Take 5,000 Units by mouth daily.   Yes [provider]  Methylcobalamin (B12-ACTIVE PO) Take 1 tablet by mouth daily.   Yes [provider]  propranolol (INDERAL) 40 MG tablet Take 40 mg by mouth as needed.   Yes [provider]  vitamin C (ASCORBIC ACID) 250 MG tablet Take 250 mg by mouth daily.   Yes [provider]  propranolol (INDERAL) 20 MG tablet Take 20 mg by mouth as needed.     [provider]  propranolol ER (INDERAL LA) 80 MG 24 hr capsule Take 1 capsule (80 mg total) by mouth daily. 09/25/16   Tonia Ghent, MD    Allergies Nitrofurantoin, Advair diskus [fluticasone-salmeterol], Barbiturates, Cefuroxime axetil, Clarithromycin, Codeine, Demerol [meperidine], Food, Gabapentin, and Oxybutynin  Family History  Problem Relation Age of Onset  . Stroke Mother   . Hypertension Mother   . Heart disease Father        2 MI's  . Bladder Cancer Father   . Diabetes Other 80  . Cancer Other   . Hypertension Other   . Hypertension Maternal Grandmother   . Stroke Maternal Grandmother   . Heart disease Maternal Grandfather   . Hypertension Maternal Grandfather   . Stroke Maternal Grandfather   . Heart disease Paternal Grandfather   . Colon cancer Neg Hx   . Breast cancer Neg Hx       Social History Social History   Tobacco Use  . Smoking status: Never Smoker  . Smokeless tobacco: Never Used  Substance Use Topics  . Alcohol use: No    Alcohol/week: 0.0 standard drinks  . Drug use: No    Review of Systems  Constitutional: No fever. Eyes: No visual changes. ENT: No sore throat. Cardiovascular: Denies chest pain. Respiratory: Denies shortness of breath. Gastrointestinal: Positive for nausea. Genitourinary: Negative for dysuria.  Musculoskeletal: Negative for back pain. Skin: Negative for rash. Neurological: Positive for left-sided cold sensation.  No headache or weakness.   ____________________________________________   PHYSICAL EXAM:  VITAL SIGNS: ED Triage Vitals  Enc Vitals Group     BP 05/01/19 1959 (!) 163/89     Pulse  Rate 05/01/19 1959 81     Resp 05/01/19 1959 20     Temp --      Temp src --      SpO2 05/01/19 1959 100 %     Weight 05/01/19 2000 175 lb (79.4 kg)     Height 05/01/19 2000 5' 8"  (1.727 m)     Head Circumference --      Peak Flow --      Pain Score 05/01/19 1959 0     Pain Loc --      Pain Edu? --      Excl. in [place]? --     Constitutional: Alert and oriented.  Slightly anxious appearing but in no acute distress. Eyes: Conjunctivae are normal.  EOMI.  PERRLA. Head: Atraumatic.  No facial droop. Nose: No congestion/rhinnorhea. Mouth/Throat: Mucous membranes are moist.   Neck: Normal range of motion.  Cardiovascular: Normal rate, regular rhythm.   Good peripheral circulation. Respiratory: Normal respiratory effort.  No retractions.  Gastrointestinal: No distention.  Musculoskeletal: No lower extremity edema.  Extremities warm and well perfused.  Neurologic:  Normal speech and language.  Motor and sensory intact in all extremities.  Normal coordination.  No ataxia on finger-to-nose.  No pronator drift. Skin:  Skin is warm and dry. No rash noted. Psychiatric: Mood and affect are normal. Speech and behavior are  normal.  ____________________________________________   LABS (all labs ordered are listed, but only abnormal results are displayed)  Labs Reviewed  COMPREHENSIVE METABOLIC PANEL - Abnormal; Notable for the following components:      Result Value   Glucose, Bld 161 (*)    Total Protein 8.2 (*)    Albumin 5.2 (*)    Total Bilirubin 1.6 (*)    All other components within normal limits  GLUCOSE, CAPILLARY - Abnormal; Notable for the following components:   Glucose-Capillary 207 (*)    All other components within normal limits  SARS CORONAVIRUS 2 (TAT 6-24 HRS)  CBC WITH DIFFERENTIAL/PLATELET  URINALYSIS, COMPLETE (UACMP) WITH MICROSCOPIC  PROTIME-INR  TROPONIN I (HIGH SENSITIVITY)   ____________________________________________  EKG  ED ECG REPORT I, Arta Silence, the attending physician, personally viewed and interpreted this ECG.  Date: 05/01/2019 EKG Time: 2017 Rate: 86 Rhythm: normal sinus rhythm QRS Axis: normal Intervals: normal ST/T Wave abnormalities: Borderline nonspecific inferior ST abnormalities Narrative Interpretation: Nonspecific abnormalities with no evidence of acute ischemia  ____________________________________________  RADIOLOGY  CT head: Possible density along two right MCA with no other acute abnormality ____________________________________________   PROCEDURES  Procedure(s) performed: No  Procedures  Critical Care performed: Yes  CRITICAL CARE Performed by: Arta Silence   Total critical care time: 40 minutes  Critical care time was exclusive of separately billable procedures and treating other patients.  Critical care was necessary to treat or prevent imminent or life-threatening deterioration.  Critical care was time spent personally by me on the following activities: development of treatment plan with patient and/or surrogate as well as nursing, discussions with consultants, evaluation of patient's response to  treatment, examination of patient, obtaining history from patient or surrogate, ordering and performing treatments and interventions, ordering and review of laboratory studies, ordering and review of radiographic studies, pulse oximetry and re-evaluation of patient's condition. ____________________________________________   INITIAL IMPRESSION / ASSESSMENT AND PLAN / ED COURSE  Pertinent labs & imaging results that were available during my care of the patient were reviewed by me and considered in my medical decision making (see chart for details).  54 year old  female with PMH as noted above presents with acute onset of dizziness, nausea, and near syncope, but also with a cool sensation on her left side, and a questionable left-sided facial droop.  All of the symptoms have resolved except for some intermittent dizziness.  On exam, the patient is anxious but otherwise well-appearing.  Her vital signs are normal except for hypertension.  Neurologic exam is nonfocal at this time.  Patient presented within less than 30 minutes of the onset of symptoms.  Code stroke was activated.  I have discussed the case with the teleneurologist.  Overall the clinical suspicion for acute stroke is low, the patient currently declines tPA.  CT shows possible right-sided MCA density, and neurology recommends CT angio and perfusion study for further evaluation.  ----------------------------------------- 10:38 PM on 05/01/2019 -----------------------------------------  CT angio and perfusion study showed no acute abnormality.  I discussed the case with the teleneurologist Dr. Gaye Pollack, who recommends admission.  At this time, the patient is asymptomatic.  Overall I suspect most likely a near syncopal type event with some transient neurologic symptoms, however given the duration of the symptoms and the focal left-sided symptoms, we will admit for TIA work-up.  The patient agrees with this  plan.  __________________________  [name] was evaluated in Emergency Department on 05/01/2019 for the symptoms described in the history of present illness. She was evaluated in the context of the global COVID-19 pandemic, which necessitated consideration that the patient might be at risk for infection with the SARS-CoV-2 virus that causes COVID-19. Institutional protocols and algorithms that pertain to the evaluation of patients at risk for COVID-19 are in a state of rapid change based on information released by regulatory bodies including the CDC and federal and state organizations. These policies and algorithms were followed during the patient's care in the ED.  ____________________________________________   FINAL CLINICAL IMPRESSION(S) / ED DIAGNOSES  Final diagnoses:  Near syncope      NEW MEDICATIONS STARTED DURING THIS VISIT:  New Prescriptions   No medications on file     Note:  This document was prepared using Dragon voice recognition software and may include unintentional dictation errors.    Arta Silence, MD 05/01/19 2240

## 2019-05-01 NOTE — ED Notes (Signed)
Neurologist decision to get additional scans at this time

## 2019-05-02 ENCOUNTER — Observation Stay: Admit: 2019-05-02 | Payer: Medicare Other

## 2019-05-02 ENCOUNTER — Telehealth: Payer: Self-pay | Admitting: Cardiovascular Disease

## 2019-05-02 ENCOUNTER — Observation Stay: Payer: Medicare Other

## 2019-05-02 ENCOUNTER — Observation Stay (HOSPITAL_BASED_OUTPATIENT_CLINIC_OR_DEPARTMENT_OTHER)
Admit: 2019-05-02 | Discharge: 2019-05-02 | Disposition: A | Discharge status: Home / Self Care | Payer: Medicare Other | Attending: Neurology | Admitting: Neurology

## 2019-05-02 DIAGNOSIS — R42 Dizziness and giddiness: Secondary | ICD-10-CM | POA: Diagnosis not present

## 2019-05-02 DIAGNOSIS — G459 Transient cerebral ischemic attack, unspecified: Secondary | ICD-10-CM

## 2019-05-02 DIAGNOSIS — E785 Hyperlipidemia, unspecified: Secondary | ICD-10-CM | POA: Diagnosis not present

## 2019-05-02 LAB — LIPID PANEL
Cholesterol: 186 mg/dL (ref 0–200)
HDL: 47 mg/dL (ref >40)
LDL Cholesterol: 110 mg/dL — ABNORMAL HIGH (ref 0–99)
Total CHOL/HDL Ratio: 4 RATIO
Triglycerides: 146 mg/dL (ref <150)
VLDL: 29 mg/dL (ref 0–40)

## 2019-05-02 LAB — PROTIME-INR
INR: 0.9 (ref 0.8–1.2)
Prothrombin Time: 11.7 seconds (ref 11.4–15.2)

## 2019-05-02 LAB — HEMOGLOBIN A1C
Hgb A1c MFr Bld: 5.9 % — ABNORMAL HIGH (ref 4.8–5.6)
Mean Plasma Glucose: 122.63 mg/dL

## 2019-05-02 LAB — SARS CORONAVIRUS 2 (TAT 6-24 HRS): SARS Coronavirus 2: NEGATIVE

## 2019-05-02 LAB — HIV ANTIBODY (ROUTINE TESTING W REFLEX): HIV Screen 4th Generation wRfx: NONREACTIVE

## 2019-05-02 MED ORDER — CLOPIDOGREL BISULFATE 75 MG PO TABS: 75.0000 mg | ORAL_TABLET | Freq: Every day | ORAL | 0 refills | Status: AC

## 2019-05-02 MED ORDER — ASPIRIN 81 MG PO TBEC: 81.0000 mg | DELAYED_RELEASE_TABLET | Freq: Every day | ORAL | 1 refills | Status: DC

## 2019-05-02 MED ORDER — ASPIRIN EC 81 MG PO TBEC
81.0000 mg | DELAYED_RELEASE_TABLET | Freq: Every day | ORAL | Status: DC
  Administered 2019-05-02: 81 mg via ORAL
  Filled 2019-05-02: qty 1

## 2019-05-02 MED ORDER — STROKE: EARLY STAGES OF RECOVERY BOOK: 1.0000 | Freq: Once | Status: AC

## 2019-05-02 NOTE — Consult Note (Signed)
Referring Physician: Arbutus Ped    Chief Complaint: Dizziness, left sided sensory deficits  HPI: [name] is an 54 y.o. female with medical history significant of TBI, migraine headache, POTS, NASH, OSA on CPAP, HLD who presented with complaints of dizziness and left facial and LLE sensory changes.   Patient reports eating on yesterday and after she finished eating she started to note lightheadedness and dizziness. Felt as if she was going to pass out and disoriented.  Was helped to the car where these symptoms continued. She also noted a cold sensation on the left side of her face and the LLE.  Patient was driven to the hospital at that time.  Feels she passed out en route.  Symptoms improved somewhat in parking lot but did not return to baseline.  Patient presented at that time.  Reports had return to baseline during work up in ED but on examination today reports feeling less sensation on the LUE and LLE.   Initial NIHSS of 1.  Date last known well: 05/01/2019 Time last known well: Time: 19:45 tPA Given: No: Improvement in symptoms, patient refusal  Past Medical History:  Diagnosis Date  . B12 deficiency   . Cirrhosis of liver not due to alcohol [facility])    tx at [facility]  . Compression fracture of cervical vertebra (HCC)    C5-6  . Diabetes mellitus    DIET CONTROLLED  . Diverticulitis    04/2008  . Dysautonomia ([place])   . Dysrhythmia    POTS (postural orthostatic tachycardia syndrome)  . Fatty liver   . GERD (gastroesophageal reflux disease)   . Hyperlipidemia   . Hypertension   . Hyperthyroidism    f/u testing neg  . Kidney cyst, acquired    benign "lesion"  . Mononucleosis 2005  . NASH (nonalcoholic steatohepatitis)   . Neuromuscular disorder ([place])    DR LOVE HAD 1 EPISODE 2010 INVOL MOVEMENT  LT ARM   . OSA (obstructive sleep apnea)   . Pneumonia    HX PNEUMONIA   . Pneumonia   . PONV (postoperative nausea and vomiting)    1994 HYST  WOKE UP    . POTS  (postural orthostatic tachycardia syndrome)   . RLS (restless legs syndrome)   . Seizures ([place])   . Sleep apnea   . Steatosis of liver   . Thyroid nodule   . Treadmill stress test negative for angina pectoris 01/2000    Past Surgical History:  Procedure Laterality Date  . ABDOMINAL HYSTERECTOMY    . APPENDECTOMY    . BLADDER REPAIR  2003  . CARDIAC CATHETERIZATION     12/2010 with Dr. Gwenlyn Found, normal  . CARDIOVASCULAR STRESS TEST  07/2006   Negative, (equivocal), increased uptake of radiotracer mid back  . CARDIOVASCULAR STRESS TEST     11/2010, no ischemia  . CHOLECYSTECTOMY     04/14/12   IN [facility]  . COLONOSCOPY WITH PROPOFOL N/A 11/05/2014   Procedure: COLONOSCOPY WITH PROPOFOL;  Surgeon: Lollie Sails, MD;  Location: [facility] ENDOSCOPY;  Service: Endoscopy;  Laterality: N/A;  . DOPPLER ECHOCARDIOGRAPHY     small effusion, but normal o/w  . ESOPHAGOGASTRODUODENOSCOPY  03/2000   Negative  . LUMBAR LAMINECTOMY/DECOMPRESSION MICRODISCECTOMY  04/20/2012   Procedure: LUMBAR LAMINECTOMY/DECOMPRESSION MICRODISCECTOMY 1 LEVEL;  Surgeon: Winfield Cunas, MD;  Location: [facility] NEURO ORS;  Service: Neurosurgery;  Laterality: Left;  LEFT Lumbar five-Sacral one diskectomy  . pubic vaginal sling     2002,  2006  . Stress cardiolite  12/2002   Negative  . TONSILLECTOMY    . TONSILLECTOMY    . US abdominal  08/2003 & 01/2005   Fatty liver (both times)    Family History  Problem Relation Age of Onset  . Stroke Mother   . Hypertension Mother   . Heart disease Father        2 MI's  . Bladder Cancer Father   . Diabetes Other 80  . Cancer Other   . Hypertension Other   . Hypertension Maternal Grandmother   . Stroke Maternal Grandmother   . Heart disease Maternal Grandfather   . Hypertension Maternal Grandfather   . Stroke Maternal Grandfather   . Heart disease Paternal Grandfather   . Colon cancer Neg Hx   . Breast cancer Neg Hx    Social History:  reports that she has never  smoked. She has never used smokeless tobacco. She reports that she does not drink alcohol or use drugs.  Allergies:  Allergies  Allergen Reactions  . Nitrofurantoin Rash  . Advair Diskus [Fluticasone-Salmeterol]     Intolerant- palpitations.   . Barbiturates     Hallucinations  . Cefuroxime Axetil     REACTION: GI upset  . Clarithromycin     REACTION: nausea  . Codeine     Hypersensitive   . Demerol [Meperidine] Nausea Only  . Food     [place]  . Gabapentin     Sedation/depressed mood  . Oxybutynin     REACTION: fluid retention, headaches    Medications:  I have reviewed the patient's current medications. Prior to Admission:  Medications Prior to Admission  Medication Sig Dispense Refill Last Dose  . Alum & Mag Hydroxide-Simeth (MAG-AL PLUS PO) Take 1 tablet by mouth daily.   05/01/2019 at Unknown time  . Barberry-Oreg Grape-Goldenseal (BERBERINE COMPLEX PO) Take 1 capsule by mouth daily.   05/01/2019 at Unknown time  . cholecalciferol (VITAMIN D3) 25 MCG (1000 UNIT) tablet Take 5,000 Units by mouth daily.   05/01/2019 at Unknown time  . Methylcobalamin (B12-ACTIVE PO) Take 1 tablet by mouth daily.   05/01/2019 at Unknown time  . propranolol (INDERAL) 40 MG tablet Take 40 mg by mouth as needed.   05/01/2019 at Unknown time  . vitamin C (ASCORBIC ACID) 250 MG tablet Take 250 mg by mouth daily.   05/01/2019 at Unknown time  . propranolol (INDERAL) 20 MG tablet Take 20 mg by mouth as needed.    prn at prn  . propranolol ER (INDERAL LA) 80 MG 24 hr capsule Take 1 capsule (80 mg total) by mouth daily.   prn at prn   Scheduled: .  stroke: mapping our early stages of recovery book   Does not apply Once  . aspirin EC  81 mg Oral Daily  . enoxaparin (LOVENOX) injection  40 mg Subcutaneous Q24H    ROS: History obtained from the patient  General ROS: negative for - chills, fatigue, fever, night sweats, weight gain or weight loss Psychological ROS: negative for - behavioral disorder,  hallucinations, memory difficulties, mood swings or suicidal ideation Ophthalmic ROS: peripheral vision loss ENT ROS: vertigo Allergy and Immunology ROS: negative for - hives or itchy/watery eyes Hematological and Lymphatic ROS: negative for - bleeding problems, bruising or swollen lymph nodes Endocrine ROS: negative for - galactorrhea, hair pattern changes, polydipsia/polyuria or temperature intolerance Respiratory ROS: negative for - cough, hemoptysis, shortness of breath or wheezing Cardiovascular ROS: syncope Gastrointestinal ROS: negative  for - abdominal pain, diarrhea, hematemesis, nausea/vomiting or stool incontinence Genito-Urinary ROS: negative for - dysuria, hematuria, incontinence or urinary frequency/urgency Musculoskeletal ROS: negative for - joint swelling or muscular weakness Neurological ROS: as noted in HPI, gait unsteadiness Dermatological ROS: negative for rash and skin lesion changes  Physical Examination: Blood pressure 129/87, pulse 62, temperature 98.4 F (36.9 C), temperature source Oral, resp. rate 16, height 5' 8"  (1.727 m), weight 87 kg, SpO2 98 %.  HEENT-  Normocephalic, no lesions, without obvious abnormality.  Normal external eye and conjunctiva.  Normal TM's bilaterally.  Normal auditory canals and external ears. Normal external nose, mucus membranes and septum.  Normal pharynx. Cardiovascular- S1, S2 normal, pulses palpable throughout   Lungs- chest clear, no wheezing, rales, normal symmetric air entry Abdomen- soft, non-tender; bowel sounds normal; no masses,  no organomegaly Extremities- no edema Lymph-no adenopathy palpable Musculoskeletal-no joint tenderness, deformity or swelling Skin-warm and dry, no hyperpigmentation, vitiligo, or suspicious lesions  Neurological Examination   Mental Status: Alert, oriented, thought content appropriate.  Speech fluent without evidence of aphasia.  Able to follow 3 step commands without difficulty. Cranial  Nerves: II: Discs flat bilaterally; Decreased right peripheral vision, pupils equal, round, reactive to light and accommodation III,IV, VI: ptosis not present, extra-ocular motions intact bilaterally V,VII: smile symmetric, facial light touch sensation normal bilaterally VIII: hearing normal bilaterally IX,X: gag reflex present XI: bilateral shoulder shrug XII: midline tongue extension Motor: Generalized weakness throughout with patient able to lift all extremities against gravity and maintain but having difficulty opposing resistance throughout Sensory: Pinprick and light touch decreased in the left upper and left lower extremities Deep Tendon Reflexes: Symmetric throughout Plantars: Right: downgoing   Left: downgoing Cerebellar: Normal finger-to-nose and normal heel-to-shin testing bilaterally.  Decreased RAMs bilaterally Gait: not tested due to safety concerns    Laboratory Studies:  Basic Metabolic Panel: Recent Labs  Lab 05/01/19 2023  NA 141  K 4.1  CL 103  CO2 29  GLUCOSE 161*  BUN 12  CREATININE 0.67  CALCIUM 10.0    Liver Function Tests: Recent Labs  Lab 05/01/19 2023  AST 40  ALT 32  ALKPHOS 42  BILITOT 1.6*  PROT 8.2*  ALBUMIN 5.2*   No results for input(s): LIPASE, AMYLASE in the last 168 hours. No results for input(s): AMMONIA in the last 168 hours.  CBC: Recent Labs  Lab 05/01/19 2023  WBC 6.1  NEUTROABS 3.0  HGB 15.0  HCT 44.8  MCV 90.9  PLT 253    Cardiac Enzymes: No results for input(s): CKTOTAL, CKMB, CKMBINDEX, TROPONINI in the last 168 hours.  BNP: Invalid input(s): POCBNP  CBG: Recent Labs  Lab 05/01/19 2015  GLUCAP 207*    Microbiology: Results for orders placed or performed during the hospital encounter of 05/01/19  SARS CORONAVIRUS 2 (TAT 6-24 HRS) Nasopharyngeal Nasopharyngeal Swab     Status: None   Collection Time: 05/01/19  9:20 PM   Specimen: Nasopharyngeal Swab  Result Value Ref Range Status   SARS  Coronavirus 2 NEGATIVE NEGATIVE Final    Comment: (NOTE) SARS-CoV-2 target nucleic acids are NOT DETECTED. The SARS-CoV-2 RNA is generally detectable in upper and lower respiratory specimens during the acute phase of infection. Negative results do not preclude SARS-CoV-2 infection, do not rule out co-infections with other pathogens, and should not be used as the sole basis for treatment or other patient management decisions. Negative results must be combined with clinical observations, patient history, and epidemiological information. The expected  result is Negative. Fact Sheet for Patients: SugarRoll.be Fact Sheet for Healthcare Providers: https://www.woods-mathews.com/ This test is not yet approved or cleared by the [place] FDA and  has been authorized for detection and/or diagnosis of SARS-CoV-2 by FDA under an Emergency Use Authorization (EUA). This EUA will remain  in effect (meaning this test can be used) for the duration of the COVID-19 declaration under Section 56 4(b)(1) of the Act, 21 U.S.C. section 360bbb-3(b)(1), unless the authorization is terminated or revoked sooner. Performed at [facility], [place] [address] [place], [place] [address]     Coagulation Studies: Recent Labs    05/02/19 0052  LABPROT 11.7  INR 0.9    Urinalysis: No results for input(s): COLORURINE, LABSPEC, PHURINE, GLUCOSEU, HGBUR, BILIRUBINUR, KETONESUR, PROTEINUR, UROBILINOGEN, NITRITE, LEUKOCYTESUR in the last 168 hours.  Invalid input(s): APPERANCEUR  Lipid Panel:    Component Value Date/Time   CHOL 186 05/02/2019 0504   CHOL 221 05/22/2014 0000   TRIG 146 05/02/2019 0504   TRIG 244 05/22/2014 0000   HDL 47 05/02/2019 0504   CHOLHDL 4.0 05/02/2019 0504   VLDL 29 05/02/2019 0504   LDLCALC 110 (H) 05/02/2019 0504   LDLCALC 129 05/22/2014 0000    HgbA1C:  Lab Results  Component Value Date   HGBA1C 5.9 (H) 05/02/2019    Urine  Drug Screen:  No results found for: LABOPIA, COCAINSCRNUR, LABBENZ, AMPHETMU, THCU, LABBARB  Alcohol Level: No results for input(s): ETH in the last 168 hours.  Other results: EKG: sinus rhythm at 86 bpm with prolonged PR interval.    Imaging: CT ANGIO HEAD W OR WO CONTRAST  Result Date: 05/01/2019 CLINICAL DATA:  Left-sided numbness and facial droop EXAM: CT ANGIOGRAPHY HEAD AND NECK CT PERFUSION BRAIN TECHNIQUE: Multidetector CT imaging of the head and neck was performed using the standard protocol during bolus administration of intravenous contrast. Multiplanar CT image reconstructions and MIPs were obtained to evaluate the vascular anatomy. Carotid stenosis measurements (when applicable) are obtained utilizing NASCET criteria, using the distal internal carotid diameter as the denominator. Multiphase CT imaging of the brain was performed following IV bolus contrast injection. Subsequent parametric perfusion maps were calculated using RAPID software. CONTRAST:  11m OMNIPAQUE IOHEXOL 350 MG/ML SOLN COMPARISON:  None. FINDINGS: CTA NECK FINDINGS Aortic arch: Great vessel origins are patent Right carotid system: Patent. There is mild calcified plaque at the ICA origin without measurable stenosis. Left carotid system: Patent. There is minimal calcified plaque at the ICA origin without measurable stenosis. Vertebral arteries: Patent and codominant. Skeleton: Multilevel degenerative changes of the cervical spine. Other neck: There is no neck mass or adenopathy. Upper chest: Included upper lungs are clear. Review of the MIP images confirms the above findings CTA HEAD FINDINGS Anterior circulation: Intracranial internal carotid arteries are patent. Middle and anterior cerebral arteries are patent. Posterior circulation: Intracranial vertebral arteries, basilar artery, and posterior cerebral arteries are patent. Bilateral posterior communicating arteries are identified. Venous sinuses: As permitted by contrast  timing, patent. Review of the MIP images confirms the above findings CT Brain Perfusion Findings: CBF (<30%) Volume: 036mPerfusion (Tmax>6.0s) volume: 43m46mismatch Volume: 43mL38mfarction Location:None IMPRESSION: No large vessel occlusion or hemodynamically significant stenosis. Normal perfusion imaging. Electronically Signed   By: PranMacy Mis.   On: 05/01/2019 21:29   CT ANGIO NECK W OR WO CONTRAST  Result Date: 05/01/2019 CLINICAL DATA:  Left-sided numbness and facial droop EXAM: CT ANGIOGRAPHY HEAD AND NECK CT PERFUSION BRAIN TECHNIQUE: Multidetector CT  imaging of the head and neck was performed using the standard protocol during bolus administration of intravenous contrast. Multiplanar CT image reconstructions and MIPs were obtained to evaluate the vascular anatomy. Carotid stenosis measurements (when applicable) are obtained utilizing NASCET criteria, using the distal internal carotid diameter as the denominator. Multiphase CT imaging of the brain was performed following IV bolus contrast injection. Subsequent parametric perfusion maps were calculated using RAPID software. CONTRAST:  174m OMNIPAQUE IOHEXOL 350 MG/ML SOLN COMPARISON:  None. FINDINGS: CTA NECK FINDINGS Aortic arch: Great vessel origins are patent Right carotid system: Patent. There is mild calcified plaque at the ICA origin without measurable stenosis. Left carotid system: Patent. There is minimal calcified plaque at the ICA origin without measurable stenosis. Vertebral arteries: Patent and codominant. Skeleton: Multilevel degenerative changes of the cervical spine. Other neck: There is no neck mass or adenopathy. Upper chest: Included upper lungs are clear. Review of the MIP images confirms the above findings CTA HEAD FINDINGS Anterior circulation: Intracranial internal carotid arteries are patent. Middle and anterior cerebral arteries are patent. Posterior circulation: Intracranial vertebral arteries, basilar artery, and posterior  cerebral arteries are patent. Bilateral posterior communicating arteries are identified. Venous sinuses: As permitted by contrast timing, patent. Review of the MIP images confirms the above findings CT Brain Perfusion Findings: CBF (<30%) Volume: 026mPerfusion (Tmax>6.0s) volume: 1m29mismatch Volume: 1mL26mfarction Location:None IMPRESSION: No large vessel occlusion or hemodynamically significant stenosis. Normal perfusion imaging. Electronically Signed   By: PranMacy Mis.   On: 05/01/2019 21:29   CT CEREBRAL PERFUSION W CONTRAST  Result Date: 05/01/2019 CLINICAL DATA:  Left-sided numbness and facial droop EXAM: CT ANGIOGRAPHY HEAD AND NECK CT PERFUSION BRAIN TECHNIQUE: Multidetector CT imaging of the head and neck was performed using the standard protocol during bolus administration of intravenous contrast. Multiplanar CT image reconstructions and MIPs were obtained to evaluate the vascular anatomy. Carotid stenosis measurements (when applicable) are obtained utilizing NASCET criteria, using the distal internal carotid diameter as the denominator. Multiphase CT imaging of the brain was performed following IV bolus contrast injection. Subsequent parametric perfusion maps were calculated using RAPID software. CONTRAST:  101mL42mIPAQUE IOHEXOL 350 MG/ML SOLN COMPARISON:  None. FINDINGS: CTA NECK FINDINGS Aortic arch: Great vessel origins are patent Right carotid system: Patent. There is mild calcified plaque at the ICA origin without measurable stenosis. Left carotid system: Patent. There is minimal calcified plaque at the ICA origin without measurable stenosis. Vertebral arteries: Patent and codominant. Skeleton: Multilevel degenerative changes of the cervical spine. Other neck: There is no neck mass or adenopathy. Upper chest: Included upper lungs are clear. Review of the MIP images confirms the above findings CTA HEAD FINDINGS Anterior circulation: Intracranial internal carotid arteries are patent.  Middle and anterior cerebral arteries are patent. Posterior circulation: Intracranial vertebral arteries, basilar artery, and posterior cerebral arteries are patent. Bilateral posterior communicating arteries are identified. Venous sinuses: As permitted by contrast timing, patent. Review of the MIP images confirms the above findings CT Brain Perfusion Findings: CBF (<30%) Volume: 1mL P34musion (Tmax>6.0s) volume: 1mL Mi51mtch Volume: 1mL Inf62mtion Location:None IMPRESSION: No large vessel occlusion or hemodynamically significant stenosis. Normal perfusion imaging. Electronically Signed   By: Praneil Macy MisOn: 05/01/2019 21:29   CT HEAD CODE STROKE WO CONTRAST  Result Date: 05/01/2019 CLINICAL DATA:  Code stroke. EXAM: CT HEAD WITHOUT CONTRAST TECHNIQUE: Contiguous axial images were obtained from the base of the skull through the vertex without intravenous contrast. COMPARISON:  None. FINDINGS: Brain:  There is no acute intracranial hemorrhage, mass effect, or edema. Gray-white differentiation is preserved. Ventricles and sulci are normal in size and configuration. There is no extra-axial fluid collection Vascular: Questionable hyperdensity along two right MCA branches within the sylvian fissure. Skull: Unremarkable. Sinuses/Orbits: No acute abnormality. Other: Mastoid air cells are clear. ASPECTS ([place] Stroke Program Early CT Score) - Ganglionic level infarction (caudate, lentiform nuclei, internal capsule, insula, M1-M3 cortex): 7 - Supraganglionic infarction (M4-M6 cortex): 3 Total score (0-10 with 10 being normal): 10 IMPRESSION: No acute intracranial hemorrhage or evidence of acute infarction. ASPECTS is 10. Questionable hyperdensity along two right MCA branches within the sylvian fissure, which could reflect intraluminal thrombus. These results were called by telephone at the time of interpretation on 05/01/2019 at 8:21 pm to provider [facility] , who verbally acknowledged these results.  Electronically Signed   By: Macy Mis M.D.   On: 05/01/2019 20:24    Assessment: 54 y.o. female  with medical history significant of TBI, migraine headache, POTS, NASH, OSA on CPAP, HLD presenting with dizziness and left sided sensory changes.  Dizziness seems to have resolved but continues to have sensory changes.  Although patient with a history of POTS and increased stressors at this time due to passing of mother can not rule out cerebrovascular disease as etiology, TIA vs acute infarct.  Head CT reviewed and shows no acute changes.  Questionable right MCA hyperdensities were not confirmed on CTA.  CTP unremarkable.   Patient on no antiplatelet therapy prior to admission.  Intolerant of statins. Echocardiogram are pending.   LDL 110.  A1c 5.9.    Stroke Risk Factors - diabetes mellitus, hyperlipidemia and hypertension  Plan: 1. Diet consult  For lipid management since patient intolerant to statins.   2. MRI of the brain without contrast 3. PT consult, OT consult, Speech consult 4. Echocardiogram pending 5. Prophylactic therapy-Dual antiplatelet therapy with ASA 34m and Plavix 730mfor three weeks with change to ASA 8139maily alone as monotherapy after that time. 6. Telemetry monitoring 7. Frequent neuro checks   LesAlexis GoodellD Neurology [phone]/2021, 10:40 AM

## 2019-05-02 NOTE — Plan of Care (Signed)

## 2019-05-02 NOTE — Progress Notes (Signed)
OT Cancellation Note  Patient Details Name: [name] MRN: [id number] DOB: 04/18/1965   Cancelled Treatment:    Reason Eval/Treat Not Completed: OT screened, no needs identified, will sign off. Thank your for the OT consult. Order received and chart reviewed. This [place] Curia spoke with pt who indicates symptoms have generally resolved and she endorses feeling back to her baseline for functional independence. She declines to trial ADLs this date, but states she has been up to use her room bathroom without difficulty. No skilled OT needs identified at this time. Will sign off. Please re-consult if additional OT needs arise during this admission.  Shara Blazing, M.S., OTR/L Ascom: [phone] 05/02/19, 10:45 AM

## 2019-05-02 NOTE — Telephone Encounter (Signed)
Routed to MD/RN as Juluis Rainier

## 2019-05-02 NOTE — Progress Notes (Signed)
Set patient up with auto cpap for use at night time. Patient c/o noise from circuit. Explained function of exhalation port and advised can have personal unit brought in from home if becomes uncomfortable. Patient states stay may be just over night and may just take off if unbearable. Advised how to control on/off if needed.

## 2019-05-02 NOTE — Progress Notes (Signed)
Discharge instructions explained to pt/ verbalized an understanding/ iv and tele removed/ will transport off unit via wheelchair.  

## 2019-05-02 NOTE — Evaluation (Signed)
Physical Therapy Evaluation Patient Details Name: [name] MRN: [id number] DOB: 03-Nov-1965 Today's Date: 05/02/2019   History of Present Illness  54 y.o. female with medical history significant of TBI, migraine headache, POTS, NASH, OSA on CPAP, HLD who presents with concerns of left facial and LE cold sensation along with dizziness. CT negative for acute CVA.  Clinical Impression  Pt reports that she has had multiple different issues with dizziness (BPPV, POTS, brain injury related) but states that these symptoms were different that anything she has felt before.  She reports feeling that she is back at baseline and was able to ambulate and negotiate up/down steps at her baseline level.  She did have some minimal limp/coordination issues with LEs during ambulation but again she reports this as normal and she was safe and efficient at that level.     Follow Up Recommendations No PT follow up    Equipment Recommendations  None recommended by PT    Recommendations for Other Services       Precautions / Restrictions Precautions Precautions: None Restrictions Weight Bearing Restrictions: No      Mobility  Bed Mobility Overal bed mobility: Independent             General bed mobility comments: Pt was able to easily get up to sitting EOB w/o issue  Transfers Overall transfer level: Independent Equipment used: Rolling walker (2 wheeled)             General transfer comment: Pt was able to rise to standing w/o issue, did need a few minutes sitting EOB before doing so (which is her baseline)   Ambulation/Gait Ambulation/Gait assistance: Modified independent (Device/Increase time) Gait Distance (Feet): 300 Feet Assistive device: None       General Gait Details: Pt able to ambulate at commujnity appropriate speeds and with good confidence and no LOBs.  She did have some baseline limp that she reports she has worked on a lot in the past and continues to be  cognitive of.  No safety issues, no LOBs.  Stairs Stairs: Yes Stairs assistance: Modified independent (Device/Increase time) Stair Management: One rail Right Number of Stairs: 15 General stair comments: Pt was able to negotiate up/down steps with reciprocal pattern - more confident with R leading ascent but per pt is at her baseline  Wheelchair Mobility    Modified Rankin (Stroke Patients Only)       Balance Overall balance assessment: Modified Independent                                           Pertinent Vitals/Pain Pain Assessment: No/denies pain    Home Living Family/patient expects to be discharged to:: Private residence Living Arrangements: Spouse/significant other;Children Available Help at Discharge: Family;Available 24 hours/day Type of Home: House Home Access: Stairs to enter Entrance Stairs-Rails: (yes) Entrance Stairs-Number of Steps: 3 Home Layout: Two level Home Equipment: Cane - single point;Wheelchair - Brewing technologist - built in      Prior Function Level of Independence: Independent         Comments: Pt does not drive or do solo community activities much but is able to do all ADLs, etc with extra care re: medical history/POTs     Hand Dominance   Dominant Hand: Left    Extremity/Trunk Assessment   Upper Extremity Assessment Upper Extremity Assessment: Overall WFL for tasks assessed  Lower Extremity Assessment Lower Extremity Assessment: Overall WFL for tasks assessed(functional t/o, minimal baseline coordination issues L>R)       Communication   Communication: No difficulties  Cognition Arousal/Alertness: Awake/alert Behavior During Therapy: WFL for tasks assessed/performed Overall Cognitive Status: Within Functional Limits for tasks assessed                                        General Comments      Exercises     Assessment/Plan    PT Assessment Patent does not need any further PT  services  PT Problem List         PT Treatment Interventions      PT Goals (Current goals can be found in the Care Plan section)  Acute Rehab PT Goals Patient Stated Goal: figure out what caused this PT Goal Formulation: With patient Time For Goal Achievement: 05/16/19 Potential to Achieve Goals: Good    Frequency     Barriers to discharge        Co-evaluation               AM-PAC PT "6 Clicks" Mobility  Outcome Measure Help needed turning from your back to your side while in a flat bed without using bedrails?: None Help needed moving from lying on your back to sitting on the side of a flat bed without using bedrails?: None Help needed moving to and from a bed to a chair (including a wheelchair)?: None Help needed standing up from a chair using your arms (e.g., wheelchair or bedside chair)?: None Help needed to walk in hospital room?: None Help needed climbing 3-5 steps with a railing? : None 6 Click Score: 24    End of Session Equipment Utilized During Treatment: Gait belt Activity Tolerance: Patient tolerated treatment well Patient left: with call bell/phone within reach;in bed Nurse Communication: Mobility status PT Visit Diagnosis: Muscle weakness (generalized) (M62.81);Ataxic gait (R26.0)    Time: 7615-1834 PT Time Calculation (min) (ACUTE ONLY): 29 min   Charges:   PT Evaluation $PT Eval Low Complexity: 1 Low PT Treatments $Gait Training: 8-22 mins        Kreg Shropshire, DPT 05/02/2019, 9:35 AM

## 2019-05-02 NOTE — Discharge Summary (Signed)
Physician Discharge Summary  Azyria Osmon TRR:[id number] DOB: 1966/01/25 DOA: 05/01/2019  PCP: Tonia Ghent, MD  Admit date: 05/01/2019 Discharge date: 05/02/2019  Admitted From: Home Disposition:  Home  Recommendations for Outpatient Follow-up:  1. Follow up with PCP in 1-2 weeks 2. Please obtain BMP/CBC in one week 3. Please follow up with neurology  Home Health: No  Equipment/Devices: None  Discharge Condition: Stable  CODE STATUS: Full  Diet recommendation: Heart Healthy  Brief/Interim Summary:  Marketa Midkiff is a 54 y.o. female with medical history significant of TBI, migraine headache, POTS, NASH, OSA on CPAP, HLD who presents with concerns of left facial and LE cold sensation along with dizziness. Associated difficulty hearing, unsteady gait, lightheadedness, felt like brief moment of LOC in the car.  Also felt an extremely cold sensation on her left face, left thigh and calf. Symptoms improved on way to the ED.  Presented as a CODE STROKE. Hypertensive up to 333O systolic.  Labs otherwise unremarkable. CT head was obtained which showed questionable hyperdensity along MCA so CTA head/neck and perfusion scan were obtained with no occlusions.  She was evaluated by tele-neuro and declined IV TPA due to resolution of symptoms. Admission was recommended for suspected stroke/TIA workup.  Neurology consulted.  MRI brain obtained was unremarkable.  Echo showed EF 60-65%, gradie 1 diastolic dysfunction.  Patient's symptoms resolved with exception of slight subjective sensory deficit on left side extremities.  No events on telemetry.  Patient had previously done Holter recently.  Presentation most consistent with TIA, less likely comples/atypical migraine but this is possible especially in setting of TBI history.  Patient started on DAPT with ASA and Plavix, statin.  Clinically improved and stable for discharge home.    Discharge Diagnoses: Principal Problem:   TIA  (transient ischemic attack) Active Problems:   Dyslipidemia   SLEEP APNEA, OBSTRUCTIVE, MILD   Fatty liver    Discharge Instructions   Discharge Instructions    Call MD for:   Complete by: As directed    New onset of neurologic symptoms (weakness, numbness/tingling, difficulty speaking or swallowing, etc)   Call MD for:  persistant dizziness or light-headedness   Complete by: As directed    Diet - low sodium heart healthy   Complete by: As directed    Discharge instructions   Complete by: As directed    Take both aspirin and Plavix (aka clopidogrel) for 3 weeks. After 3 weeks, take only aspirin. Follow up with neurology.   Increase activity slowly   Complete by: As directed      Allergies as of 05/02/2019      Reactions   Nitrofurantoin Rash   Advair Diskus [fluticasone-salmeterol]    Intolerant- palpitations.    Barbiturates    Hallucinations   Cefuroxime Axetil    REACTION: GI upset   Clarithromycin    REACTION: nausea   Codeine    Hypersensitive    Demerol [meperidine] Nausea Only   Food    [place]   Gabapentin    Sedation/depressed mood   Oxybutynin    REACTION: fluid retention, headaches      Medication List    TAKE these medications    stroke: mapping our early stages of recovery book Misc 1 each by Does not apply route once for 1 dose.   aspirin 81 MG EC tablet Take 1 tablet (81 mg total) by mouth daily. Start taking on: May 03, 2019   B12-ACTIVE PO Take 1 tablet by  mouth daily.   BERBERINE COMPLEX PO Take 1 capsule by mouth daily.   cholecalciferol 25 MCG (1000 UNIT) tablet Commonly known as: VITAMIN D3 Take 5,000 Units by mouth daily.   clopidogrel 75 MG tablet Commonly known as: Plavix Take 1 tablet (75 mg total) by mouth daily for 21 days.   MAG-AL PLUS PO Take 1 tablet by mouth daily.   propranolol 40 MG tablet Commonly known as: INDERAL Take 40 mg by mouth as needed. What changed: Another medication with the same name was  removed. Continue taking this medication, and follow the directions you see here.   vitamin C 250 MG tablet Commonly known as: ASCORBIC ACID Take 250 mg by mouth daily.       Allergies  Allergen Reactions  . Nitrofurantoin Rash  . Advair Diskus [Fluticasone-Salmeterol]     Intolerant- palpitations.   . Barbiturates     Hallucinations  . Cefuroxime Axetil     REACTION: GI upset  . Clarithromycin     REACTION: nausea  . Codeine     Hypersensitive   . Demerol [Meperidine] Nausea Only  . Food     [place]  . Gabapentin     Sedation/depressed mood  . Oxybutynin     REACTION: fluid retention, headaches    Consultations:  Neurology   Procedures/Studies: CT ANGIO HEAD W OR WO CONTRAST  Result Date: 05/01/2019 CLINICAL DATA:  Left-sided numbness and facial droop EXAM: CT ANGIOGRAPHY HEAD AND NECK CT PERFUSION BRAIN TECHNIQUE: Multidetector CT imaging of the head and neck was performed using the standard protocol during bolus administration of intravenous contrast. Multiplanar CT image reconstructions and MIPs were obtained to evaluate the vascular anatomy. Carotid stenosis measurements (when applicable) are obtained utilizing NASCET criteria, using the distal internal carotid diameter as the denominator. Multiphase CT imaging of the brain was performed following IV bolus contrast injection. Subsequent parametric perfusion maps were calculated using RAPID software. CONTRAST:  186m OMNIPAQUE IOHEXOL 350 MG/ML SOLN COMPARISON:  None. FINDINGS: CTA NECK FINDINGS Aortic arch: Great vessel origins are patent Right carotid system: Patent. There is mild calcified plaque at the ICA origin without measurable stenosis. Left carotid system: Patent. There is minimal calcified plaque at the ICA origin without measurable stenosis. Vertebral arteries: Patent and codominant. Skeleton: Multilevel degenerative changes of the cervical spine. Other neck: There is no neck mass or adenopathy. Upper chest:  Included upper lungs are clear. Review of the MIP images confirms the above findings CTA HEAD FINDINGS Anterior circulation: Intracranial internal carotid arteries are patent. Middle and anterior cerebral arteries are patent. Posterior circulation: Intracranial vertebral arteries, basilar artery, and posterior cerebral arteries are patent. Bilateral posterior communicating arteries are identified. Venous sinuses: As permitted by contrast timing, patent. Review of the MIP images confirms the above findings CT Brain Perfusion Findings: CBF (<30%) Volume: 012mPerfusion (Tmax>6.0s) volume: 62m39mismatch Volume: 62mL76mfarction Location:None IMPRESSION: No large vessel occlusion or hemodynamically significant stenosis. Normal perfusion imaging. Electronically Signed   By: PranMacy Mis.   On: 05/01/2019 21:29   CT ANGIO NECK W OR WO CONTRAST  Result Date: 05/01/2019 CLINICAL DATA:  Left-sided numbness and facial droop EXAM: CT ANGIOGRAPHY HEAD AND NECK CT PERFUSION BRAIN TECHNIQUE: Multidetector CT imaging of the head and neck was performed using the standard protocol during bolus administration of intravenous contrast. Multiplanar CT image reconstructions and MIPs were obtained to evaluate the vascular anatomy. Carotid stenosis measurements (when applicable) are obtained utilizing NASCET criteria, using the distal internal carotid  diameter as the denominator. Multiphase CT imaging of the brain was performed following IV bolus contrast injection. Subsequent parametric perfusion maps were calculated using RAPID software. CONTRAST:  17m OMNIPAQUE IOHEXOL 350 MG/ML SOLN COMPARISON:  None. FINDINGS: CTA NECK FINDINGS Aortic arch: Great vessel origins are patent Right carotid system: Patent. There is mild calcified plaque at the ICA origin without measurable stenosis. Left carotid system: Patent. There is minimal calcified plaque at the ICA origin without measurable stenosis. Vertebral arteries: Patent and  codominant. Skeleton: Multilevel degenerative changes of the cervical spine. Other neck: There is no neck mass or adenopathy. Upper chest: Included upper lungs are clear. Review of the MIP images confirms the above findings CTA HEAD FINDINGS Anterior circulation: Intracranial internal carotid arteries are patent. Middle and anterior cerebral arteries are patent. Posterior circulation: Intracranial vertebral arteries, basilar artery, and posterior cerebral arteries are patent. Bilateral posterior communicating arteries are identified. Venous sinuses: As permitted by contrast timing, patent. Review of the MIP images confirms the above findings CT Brain Perfusion Findings: CBF (<30%) Volume: 039mPerfusion (Tmax>6.0s) volume: 9m59mismatch Volume: 9mL57mfarction Location:None IMPRESSION: No large vessel occlusion or hemodynamically significant stenosis. Normal perfusion imaging. Electronically Signed   By: PranMacy Mis.   On: 05/01/2019 21:29   MR BRAIN WO CONTRAST  Result Date: 05/02/2019 CLINICAL DATA:  Dizziness, left facial and extremity sensory changes EXAM: MRI HEAD WITHOUT CONTRAST TECHNIQUE: Multiplanar, multiecho pulse sequences of the brain and surrounding structures were obtained without intravenous contrast. COMPARISON:  No recent MR imaging. Correlation made with recent CT imaging. FINDINGS: Brain: There is no acute infarction or intracranial hemorrhage. There is no intracranial mass, mass effect, or edema. There is no hydrocephalus or extra-axial fluid collection. Vascular: Major vessel flow voids at the skull base are preserved. Skull and upper cervical spine: Normal marrow signal is preserved. Sinuses/Orbits: Paranasal sinuses are aerated. Orbits are unremarkable. Other: Sella is unremarkable.  Mastoid air cells are clear. IMPRESSION: No acute infarction, hemorrhage, or mass. Electronically Signed   By: PranMacy Mis.   On: 05/02/2019 12:42   CT CEREBRAL PERFUSION W CONTRAST  Result  Date: 05/01/2019 CLINICAL DATA:  Left-sided numbness and facial droop EXAM: CT ANGIOGRAPHY HEAD AND NECK CT PERFUSION BRAIN TECHNIQUE: Multidetector CT imaging of the head and neck was performed using the standard protocol during bolus administration of intravenous contrast. Multiplanar CT image reconstructions and MIPs were obtained to evaluate the vascular anatomy. Carotid stenosis measurements (when applicable) are obtained utilizing NASCET criteria, using the distal internal carotid diameter as the denominator. Multiphase CT imaging of the brain was performed following IV bolus contrast injection. Subsequent parametric perfusion maps were calculated using RAPID software. CONTRAST:  109mL29mIPAQUE IOHEXOL 350 MG/ML SOLN COMPARISON:  None. FINDINGS: CTA NECK FINDINGS Aortic arch: Great vessel origins are patent Right carotid system: Patent. There is mild calcified plaque at the ICA origin without measurable stenosis. Left carotid system: Patent. There is minimal calcified plaque at the ICA origin without measurable stenosis. Vertebral arteries: Patent and codominant. Skeleton: Multilevel degenerative changes of the cervical spine. Other neck: There is no neck mass or adenopathy. Upper chest: Included upper lungs are clear. Review of the MIP images confirms the above findings CTA HEAD FINDINGS Anterior circulation: Intracranial internal carotid arteries are patent. Middle and anterior cerebral arteries are patent. Posterior circulation: Intracranial vertebral arteries, basilar artery, and posterior cerebral arteries are patent. Bilateral posterior communicating arteries are identified. Venous sinuses: As permitted by contrast timing, patent. Review of the  MIP images confirms the above findings CT Brain Perfusion Findings: CBF (<30%) Volume: 19m Perfusion (Tmax>6.0s) volume: 073mMismatch Volume: 36m41mnfarction Location:None IMPRESSION: No large vessel occlusion or hemodynamically significant stenosis. Normal  perfusion imaging. Electronically Signed   By: PraMacy MisD.   On: 05/01/2019 21:29   ECHOCARDIOGRAM COMPLETE BUBBLE STUDY  Result Date: 05/02/2019   ECHOCARDIOGRAM REPORT   Patient Name:   NANELLIZABETH DACRUZADY Date of Exam: 05/02/2019 Medical Rec #:  [id number]            Height:       68.0 in Accession #:    [id number]           Weight:       191.7 lb Date of Birth:  [birth year]            BSA:          2.01 m Patient Age:    [age]                BP:           129/87 mmHg Patient Gender: F                       HR:           62 bpm. Exam Location:  [facility] Procedure: 2D Echo, Cardiac Doppler, Color Doppler and Saline Contrast Bubble            Study Indications:     TIA 435.9  History:         Patient has no prior history of Echocardiogram examinations.                  Risk Factors:Hypertension and Diabetes.  Sonographer:     JerSherrie SportCS (AE) Referring Phys:  [id number] Phys: ChrNelva Bush IMPRESSIONS  1. Left ventricular ejection fraction, by visual estimation, is 60 to 65%. The left ventricle has normal function. There is mildly increased left ventricular hypertrophy.  2. Left ventricular diastolic parameters are consistent with Grade I diastolic dysfunction (impaired relaxation).  3. The left ventricle has no regional wall motion abnormalities.  4. Global right ventricle has normal systolic function.The right ventricular size is normal. No increase in right ventricular wall thickness.  5. Left atrial size was normal.  6. Right atrial size was normal.  7. Presence of pericardial fat pad.  8. The pericardial effusion is posterior to the left ventricle.  9. Trivial pericardial effusion is present. 10. The mitral valve is normal in structure. No evidence of mitral valve regurgitation. No evidence of mitral stenosis. 11. The tricuspid valve is grossly normal. 12. The tricuspid valve is grossly normal. Tricuspid valve regurgitation is not demonstrated. 13. The aortic  valve is tricuspid. Aortic valve regurgitation is not visualized. No evidence of aortic valve sclerosis or stenosis. 14. The pulmonic valve was not well visualized. Pulmonic valve regurgitation is trivial. 15. The inferior vena cava is normal in size with greater than 50% respiratory variability, suggesting right atrial pressure of 3 mmHg. 16. The interatrial septum was not well visualized. FINDINGS  Left Ventricle: Left ventricular ejection fraction, by visual estimation, is 60 to 65%. The left ventricle has normal function. The left ventricle has no regional wall motion abnormalities. The left ventricular internal cavity size was the left ventricle is normal in size. There is mildly increased left ventricular hypertrophy. Left  ventricular diastolic parameters are consistent with Grade I diastolic dysfunction (impaired relaxation). Right Ventricle: The right ventricular size is normal. No increase in right ventricular wall thickness. Global RV systolic function is has normal systolic function. Left Atrium: Left atrial size was normal in size. Right Atrium: Right atrial size was normal in size Pericardium: Trivial pericardial effusion is present. The pericardial effusion is posterior to the left ventricle. Presence of pericardial fat pad. Mitral Valve: The mitral valve is normal in structure. No evidence of mitral valve regurgitation. No evidence of mitral valve stenosis by observation. Tricuspid Valve: The tricuspid valve is grossly normal. Tricuspid valve regurgitation is not demonstrated. Aortic Valve: The aortic valve is tricuspid. Aortic valve regurgitation is not visualized. The aortic valve is structurally normal, with no evidence of sclerosis or stenosis. Aortic valve mean gradient measures 3.5 mmHg. Aortic valve peak gradient measures 5.5 mmHg. Aortic valve area, by VTI measures 2.72 cm. Pulmonic Valve: The pulmonic valve was not well visualized. Pulmonic valve regurgitation is trivial. Pulmonic  regurgitation is trivial. No evidence of pulmonic stenosis. Aorta: The aortic root is normal in size and structure. Pulmonary Artery: The pulmonary artery is not well seen. Venous: The inferior vena cava is normal in size with greater than 50% respiratory variability, suggesting right atrial pressure of 3 mmHg. IAS/Shunts: The interatrial septum was not well visualized. Agitated saline contrast was given intravenously to evaluate for intracardiac shunting. Saline contrast bubble study was negative, with no evidence of any interatrial shunt.  LEFT VENTRICLE PLAX 2D LVIDd:         4.38 cm  Diastology LVIDs:         2.77 cm  LV e' lateral:   7.40 cm/s LV PW:         1.05 cm  LV E/e' lateral: 6.4 LV IVS:        1.13 cm  LV e' medial:    4.57 cm/s LVOT diam:     2.10 cm  LV E/e' medial:  10.3 LV SV:         58 ml LV SV Index:   28.08 LVOT Area:     3.46 cm  RIGHT VENTRICLE RV Basal diam:  1.68 cm RV S prime:     14.40 cm/s TAPSE (M-mode): 3.8 cm LEFT ATRIUM         Index      RIGHT ATRIUM           Index LA diam:    3.20 cm 1.59 cm/m RA Area:     17.85 cm 8.89 cm/m  AORTIC VALVE                   PULMONIC VALVE AV Area (Vmax):    2.35 cm    PV Vmax:        0.66 m/s AV Area (Vmean):   2.26 cm    PV Peak grad:   1.8 mmHg AV Area (VTI):     2.72 cm    RVOT Peak grad: 3 mmHg AV Vmax:           117.50 cm/s AV Vmean:          87.200 cm/s AV VTI:            0.242 m AV Peak Grad:      5.5 mmHg AV Mean Grad:      3.5 mmHg LVOT Vmax:         79.80 cm/s LVOT Vmean:  56.800 cm/s LVOT VTI:          0.190 m LVOT/AV VTI ratio: 0.79  AORTA Ao Root diam: 3.10 cm MITRAL VALVE MV Area (PHT): 4.60 cm             SHUNTS MV PHT:        47.85 msec           Systemic VTI:  0.19 m MV Decel Time: 165 msec             Systemic Diam: 2.10 cm MV E velocity: 47.10 cm/s 103 cm/s MV A velocity: 72.30 cm/s 70.3 cm/s MV E/A ratio:  0.65       1.5  Nelva Bush MD Electronically signed by Nelva Bush MD Signature Date/Time:  05/02/2019/5:57:36 PM    Final    CT HEAD CODE STROKE WO CONTRAST  Result Date: 05/01/2019 CLINICAL DATA:  Code stroke. EXAM: CT HEAD WITHOUT CONTRAST TECHNIQUE: Contiguous axial images were obtained from the base of the skull through the vertex without intravenous contrast. COMPARISON:  None. FINDINGS: Brain: There is no acute intracranial hemorrhage, mass effect, or edema. Gray-white differentiation is preserved. Ventricles and sulci are normal in size and configuration. There is no extra-axial fluid collection Vascular: Questionable hyperdensity along two right MCA branches within the sylvian fissure. Skull: Unremarkable. Sinuses/Orbits: No acute abnormality. Other: Mastoid air cells are clear. ASPECTS ([place] Stroke Program Early CT Score) - Ganglionic level infarction (caudate, lentiform nuclei, internal capsule, insula, M1-M3 cortex): 7 - Supraganglionic infarction (M4-M6 cortex): 3 Total score (0-10 with 10 being normal): 10 IMPRESSION: No acute intracranial hemorrhage or evidence of acute infarction. ASPECTS is 10. Questionable hyperdensity along two right MCA branches within the sylvian fissure, which could reflect intraluminal thrombus. These results were called by telephone at the time of interpretation on 05/01/2019 at 8:21 pm to provider [facility] , who verbally acknowledged these results. Electronically Signed   By: Macy Mis M.D.   On: 05/01/2019 20:24      Echo EF 42-35%, grade 1 diastolic dysfunction   Subjective: Patient reports slight residual sensory deficit on left. Otherwise symptoms have not recurred, mostly resolved.  No new or worsening symptoms.  No acute events reported.   Discharge Exam: Vitals:   05/02/19 1039 05/02/19 1542  BP: 129/87 118/89  Pulse: 62 70  Resp: 16 15  Temp: 98.4 F (36.9 C) 98.7 F (37.1 C)  SpO2: 98% 96%   Vitals:   05/02/19 0242 05/02/19 0436 05/02/19 1039 05/02/19 1542  BP: 125/83 135/87 129/87 118/89  Pulse: 66 68 62 70   Resp: 18  16 15   Temp:  97.9 F (36.6 C) 98.4 F (36.9 C) 98.7 F (37.1 C)  TempSrc:  Oral Oral Oral  SpO2: 97% 99% 98% 96%  Weight:      Height:        General: Pt is alert, awake, not in acute distress Cardiovascular: RRR, S1/S2 +, no rubs, no gallops Respiratory: CTA bilaterally, no wheezing, no rhonchi Abdominal: Soft, NT, ND, bowel sounds + Extremities: trace edema, no cyanosis    The results of significant diagnostics from this hospitalization (including imaging, microbiology, ancillary and laboratory) are listed below for reference.     Microbiology: Recent Results (from the past 240 hour(s))  SARS CORONAVIRUS 2 (TAT 6-24 HRS) Nasopharyngeal Nasopharyngeal Swab     Status: None   Collection Time: 05/01/19  9:20 PM   Specimen: Nasopharyngeal Swab  Result Value Ref Range Status   SARS  Coronavirus 2 NEGATIVE NEGATIVE Final    Comment: (NOTE) SARS-CoV-2 target nucleic acids are NOT DETECTED. The SARS-CoV-2 RNA is generally detectable in upper and lower respiratory specimens during the acute phase of infection. Negative results do not preclude SARS-CoV-2 infection, do not rule out co-infections with other pathogens, and should not be used as the sole basis for treatment or other patient management decisions. Negative results must be combined with clinical observations, patient history, and epidemiological information. The expected result is Negative. Fact Sheet for Patients: SugarRoll.be Fact Sheet for Healthcare Providers: https://www.woods-mathews.com/ This test is not yet approved or cleared by the [place] FDA and  has been authorized for detection and/or diagnosis of SARS-CoV-2 by FDA under an Emergency Use Authorization (EUA). This EUA will remain  in effect (meaning this test can be used) for the duration of the COVID-19 declaration under Section 56 4(b)(1) of the Act, 21 U.S.C. section 360bbb-3(b)(1), unless the  authorization is terminated or revoked sooner. Performed at [facility], [place] [address], [place], [place] [address]      Labs: BNP (last 3 results) No results for input(s): BNP in the last 8760 hours. Basic Metabolic Panel: Recent Labs  Lab 05/01/19 2023  NA 141  K 4.1  CL 103  CO2 29  GLUCOSE 161*  BUN 12  CREATININE 0.67  CALCIUM 10.0   Liver Function Tests: Recent Labs  Lab 05/01/19 2023  AST 40  ALT 32  ALKPHOS 42  BILITOT 1.6*  PROT 8.2*  ALBUMIN 5.2*   No results for input(s): LIPASE, AMYLASE in the last 168 hours. No results for input(s): AMMONIA in the last 168 hours. CBC: Recent Labs  Lab 05/01/19 2023  WBC 6.1  NEUTROABS 3.0  HGB 15.0  HCT 44.8  MCV 90.9  PLT 253   Cardiac Enzymes: No results for input(s): CKTOTAL, CKMB, CKMBINDEX, TROPONINI in the last 168 hours. BNP: Invalid input(s): POCBNP CBG: Recent Labs  Lab 05/01/19 2015  GLUCAP 207*   D-Dimer No results for input(s): DDIMER in the last 72 hours. Hgb A1c Recent Labs    05/02/19 0504  HGBA1C 5.9*   Lipid Profile Recent Labs    05/02/19 0504  CHOL 186  HDL 47  LDLCALC 110*  TRIG 146  CHOLHDL 4.0   Thyroid function studies No results for input(s): TSH, T4TOTAL, T3FREE, THYROIDAB in the last 72 hours.  Invalid input(s): FREET3 Anemia work up No results for input(s): VITAMINB12, FOLATE, FERRITIN, TIBC, IRON, RETICCTPCT in the last 72 hours. Urinalysis    Component Value Date/Time   BILIRUBINUR negative 10/09/2015 1029   PROTEINUR negative 10/09/2015 1029   UROBILINOGEN negative 10/09/2015 1029   NITRITE negative 10/09/2015 1029   LEUKOCYTESUR Negative 10/09/2015 1029   Sepsis Labs Invalid input(s): PROCALCITONIN,  WBC,  LACTICIDVEN Microbiology Recent Results (from the past 240 hour(s))  SARS CORONAVIRUS 2 (TAT 6-24 HRS) Nasopharyngeal Nasopharyngeal Swab     Status: None   Collection Time: 05/01/19  9:20 PM   Specimen: Nasopharyngeal Swab  Result  Value Ref Range Status   SARS Coronavirus 2 NEGATIVE NEGATIVE Final    Comment: (NOTE) SARS-CoV-2 target nucleic acids are NOT DETECTED. The SARS-CoV-2 RNA is generally detectable in upper and lower respiratory specimens during the acute phase of infection. Negative results do not preclude SARS-CoV-2 infection, do not rule out co-infections with other pathogens, and should not be used as the sole basis for treatment or other patient management decisions. Negative results must be combined with clinical  observations, patient history, and epidemiological information. The expected result is Negative. Fact Sheet for Patients: SugarRoll.be Fact Sheet for Healthcare Providers: https://www.woods-mathews.com/ This test is not yet approved or cleared by the [place] FDA and  has been authorized for detection and/or diagnosis of SARS-CoV-2 by FDA under an Emergency Use Authorization (EUA). This EUA will remain  in effect (meaning this test can be used) for the duration of the COVID-19 declaration under Section 56 4(b)(1) of the Act, 21 U.S.C. section 360bbb-3(b)(1), unless the authorization is terminated or revoked sooner. Performed at [facility], [place] [address] [place], [place] [address]      Time coordinating discharge: Over 30 minutes  SIGNED:   Ezekiel Slocumb, DO [facility] 05/02/2019, 6:12 PM   If 7PM-7AM, please contact night-coverage [email or web address]

## 2019-05-02 NOTE — Telephone Encounter (Signed)
New message  Patient's husband is calling in to alert Dr. Gwenlyn Found that patient is in the hospital being treated for possible TIA. If needed, patient's husband can be reached back at [phone].

## 2019-05-02 NOTE — Progress Notes (Signed)
*  PRELIMINARY RESULTS* Echocardiogram 2D Echocardiogram has been performed.  Sherrie Sport 05/02/2019, 2:54 PM

## 2019-05-03 NOTE — Telephone Encounter (Signed)
Follow Up  Patient is calling in to speak with Dr. Gwenlyn Found or his nurse about whether she needs to do a follow up visit and if she needs an everyday blood pressure medication. Please give patient a call back at [phone] to advise. Patient also sent a mychart message this morning.

## 2019-05-04 NOTE — Telephone Encounter (Signed)
Her BP was excellent in the office

## 2019-05-23 ENCOUNTER — Inpatient Hospital Stay: Admission: RE | Admit: 2019-05-23 | Payer: Medicare Other | Source: Ambulatory Visit

## 2019-06-08 ENCOUNTER — Ambulatory Visit: Payer: BLUE CROSS/BLUE SHIELD | Admitting: Neurology

## 2019-06-13 ENCOUNTER — Ambulatory Visit: Payer: Medicare Other | Admitting: Family Medicine

## 2019-06-13 ENCOUNTER — Inpatient Hospital Stay: Admission: RE | Admit: 2019-06-13 | Payer: Medicare Other | Source: Ambulatory Visit

## 2019-06-14 ENCOUNTER — Encounter: Payer: Self-pay | Admitting: Diagnostic Neuroimaging

## 2019-06-14 ENCOUNTER — Ambulatory Visit: Payer: Medicare Other | Admitting: Diagnostic Neuroimaging

## 2019-06-14 ENCOUNTER — Other Ambulatory Visit: Payer: Self-pay

## 2019-06-14 VITALS — BP 127/82 | HR 66 | Temp 97.3°F | Ht 67.5 in | Wt 193.0 lb

## 2019-06-14 DIAGNOSIS — G43109 Migraine with aura, not intractable, without status migrainosus: Secondary | ICD-10-CM
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
## 2019-06-14 NOTE — Patient Instructions (Addendum)
TRANSIENT LEFT SIDED NUMBNESS / COLD / WEAKNESS (suspect complicated migraine; TIA and seizure less likely)  - continue aspirin 80m daily  - monitor for migraine symptoms

## 2019-06-21 DIAGNOSIS — M25551 Pain in right hip: Secondary | ICD-10-CM | POA: Diagnosis not present

## 2019-06-21 DIAGNOSIS — M9906 Segmental and somatic dysfunction of lower extremity: Secondary | ICD-10-CM | POA: Diagnosis not present

## 2019-06-21 DIAGNOSIS — M9903 Segmental and somatic dysfunction of lumbar region: Secondary | ICD-10-CM | POA: Diagnosis not present

## 2019-06-21 DIAGNOSIS — M9905 Segmental and somatic dysfunction of pelvic region: Secondary | ICD-10-CM | POA: Diagnosis not present

## 2019-06-23 DIAGNOSIS — G4733 Obstructive sleep apnea (adult) (pediatric): Secondary | ICD-10-CM | POA: Diagnosis not present

## 2019-06-23 DIAGNOSIS — I10 Essential (primary) hypertension: Secondary | ICD-10-CM | POA: Diagnosis not present

## 2019-06-23 DIAGNOSIS — Z820 Family history of epilepsy and other diseases of the nervous system: Secondary | ICD-10-CM | POA: Diagnosis not present

## 2019-07-07 DIAGNOSIS — N3010 Interstitial cystitis (chronic) without hematuria: Secondary | ICD-10-CM | POA: Diagnosis not present

## 2019-07-07 DIAGNOSIS — M9901 Segmental and somatic dysfunction of cervical region: Secondary | ICD-10-CM | POA: Diagnosis not present

## 2019-07-07 DIAGNOSIS — M25551 Pain in right hip: Secondary | ICD-10-CM | POA: Diagnosis not present

## 2019-07-07 DIAGNOSIS — M9902 Segmental and somatic dysfunction of thoracic region: Secondary | ICD-10-CM | POA: Diagnosis not present

## 2019-07-07 DIAGNOSIS — M545 Low back pain: Secondary | ICD-10-CM | POA: Diagnosis not present

## 2019-07-07 DIAGNOSIS — M9903 Segmental and somatic dysfunction of lumbar region: Secondary | ICD-10-CM | POA: Diagnosis not present

## 2019-07-11 DIAGNOSIS — H47323 Drusen of optic disc, bilateral: Secondary | ICD-10-CM | POA: Diagnosis not present

## 2019-07-11 DIAGNOSIS — H53143 Visual discomfort, bilateral: Secondary | ICD-10-CM | POA: Diagnosis not present

## 2019-07-11 DIAGNOSIS — H5340 Unspecified visual field defects: Secondary | ICD-10-CM | POA: Diagnosis not present

## 2019-07-11 DIAGNOSIS — Z961 Presence of intraocular lens: Secondary | ICD-10-CM | POA: Diagnosis not present

## 2019-07-21 DIAGNOSIS — M545 Low back pain: Secondary | ICD-10-CM | POA: Diagnosis not present

## 2019-07-21 DIAGNOSIS — M25551 Pain in right hip: Secondary | ICD-10-CM | POA: Diagnosis not present

## 2019-07-21 DIAGNOSIS — W19XXXA Unspecified fall, initial encounter: Secondary | ICD-10-CM | POA: Diagnosis not present

## 2019-07-21 DIAGNOSIS — M9902 Segmental and somatic dysfunction of thoracic region: Secondary | ICD-10-CM | POA: Diagnosis not present

## 2019-07-21 DIAGNOSIS — M9901 Segmental and somatic dysfunction of cervical region: Secondary | ICD-10-CM | POA: Diagnosis not present

## 2019-08-04 DIAGNOSIS — M9901 Segmental and somatic dysfunction of cervical region: Secondary | ICD-10-CM | POA: Diagnosis not present

## 2019-08-04 DIAGNOSIS — M25551 Pain in right hip: Secondary | ICD-10-CM | POA: Diagnosis not present

## 2019-08-04 DIAGNOSIS — W19XXXA Unspecified fall, initial encounter: Secondary | ICD-10-CM | POA: Diagnosis not present

## 2019-08-04 DIAGNOSIS — M9902 Segmental and somatic dysfunction of thoracic region: Secondary | ICD-10-CM | POA: Diagnosis not present

## 2019-08-04 DIAGNOSIS — M545 Low back pain: Secondary | ICD-10-CM | POA: Diagnosis not present

## 2019-08-07 ENCOUNTER — Inpatient Hospital Stay: Admission: RE | Admit: 2019-08-07 | Payer: Medicare Other | Source: Ambulatory Visit

## 2019-08-29 ENCOUNTER — Encounter: Payer: Self-pay | Admitting: Family Medicine

## 2019-08-29 ENCOUNTER — Other Ambulatory Visit: Payer: Self-pay | Admitting: *Deleted

## 2019-08-29 MED ORDER — ONETOUCH DELICA LANCETS 30G MISC: 3 refills | Status: DC

## 2019-08-29 MED ORDER — ONETOUCH ULTRA VI STRP: ORAL_STRIP | 3 refills | Status: DC

## 2019-09-04 DIAGNOSIS — G4733 Obstructive sleep apnea (adult) (pediatric): Secondary | ICD-10-CM | POA: Diagnosis not present

## 2019-09-04 DIAGNOSIS — I10 Essential (primary) hypertension: Secondary | ICD-10-CM | POA: Diagnosis not present

## 2019-09-04 DIAGNOSIS — I498 Other specified cardiac arrhythmias: Secondary | ICD-10-CM | POA: Diagnosis not present

## 2019-09-04 DIAGNOSIS — G4730 Sleep apnea, unspecified: Secondary | ICD-10-CM | POA: Diagnosis not present

## 2019-09-18 ENCOUNTER — Encounter: Payer: Self-pay | Admitting: Family Medicine

## 2019-09-19 ENCOUNTER — Ambulatory Visit: Payer: Medicare Other | Admitting: Family Medicine

## 2019-09-19 NOTE — Telephone Encounter (Signed)
We made the decision that this appointment needs to be rescheduled for an in person visit.

## 2019-09-21 ENCOUNTER — Other Ambulatory Visit: Payer: Self-pay

## 2019-09-21 ENCOUNTER — Ambulatory Visit (INDEPENDENT_AMBULATORY_CARE_PROVIDER_SITE_OTHER): Payer: Medicare Other | Admitting: Family Medicine

## 2019-09-21 ENCOUNTER — Encounter: Payer: Self-pay | Admitting: Family Medicine

## 2019-09-21 VITALS — BP 110/70 | HR 65 | Temp 96.7°F | Ht 67.5 in | Wt 191.5 lb

## 2019-09-21 DIAGNOSIS — G4733 Obstructive sleep apnea (adult) (pediatric): Secondary | ICD-10-CM

## 2019-09-21 DIAGNOSIS — R202 Paresthesia of skin: Secondary | ICD-10-CM

## 2019-09-21 DIAGNOSIS — I498 Other specified cardiac arrhythmias: Secondary | ICD-10-CM | POA: Diagnosis not present

## 2019-09-21 DIAGNOSIS — G90A Postural orthostatic tachycardia syndrome (POTS): Secondary | ICD-10-CM

## 2019-09-21 DIAGNOSIS — F0781 Postconcussional syndrome: Secondary | ICD-10-CM

## 2019-09-21 LAB — CBC WITH DIFFERENTIAL/PLATELET
Basophils Absolute: 0 10*3/uL (ref 0.0–0.1)
Basophils Relative: 0.7 % (ref 0.0–3.0)
Eosinophils Absolute: 0.1 10*3/uL (ref 0.0–0.7)
Eosinophils Relative: 1.9 % (ref 0.0–5.0)
HCT: 41.2 % (ref 36.0–46.0)
Hemoglobin: 14 g/dL (ref 12.0–15.0)
Lymphocytes Relative: 34.9 % (ref 12.0–46.0)
Lymphs Abs: 1.7 10*3/uL (ref 0.7–4.0)
MCHC: 34 g/dL (ref 30.0–36.0)
MCV: 90.8 fl (ref 78.0–100.0)
Monocytes Absolute: 0.3 10*3/uL (ref 0.1–1.0)
Monocytes Relative: 6.6 % (ref 3.0–12.0)
Neutro Abs: 2.7 10*3/uL (ref 1.4–7.7)
Neutrophils Relative %: 55.9 % (ref 43.0–77.0)
Platelets: 220 10*3/uL (ref 150.0–400.0)
RBC: 4.54 Mil/uL (ref 3.87–5.11)
RDW: 13.2 % (ref 11.5–15.5)
WBC: 4.8 10*3/uL (ref 4.0–10.5)

## 2019-09-21 LAB — COMPREHENSIVE METABOLIC PANEL
ALT: 20 U/L (ref 0–35)
AST: 21 U/L (ref 0–37)
Albumin: 4.7 g/dL (ref 3.5–5.2)
Alkaline Phosphatase: 43 U/L (ref 39–117)
BUN: 13 mg/dL (ref 6–23)
CO2: 29 mEq/L (ref 19–32)
Calcium: 9.7 mg/dL (ref 8.4–10.5)
Chloride: 103 mEq/L (ref 96–112)
Creatinine, Ser: 0.71 mg/dL (ref 0.40–1.20)
GFR: 85.14 mL/min (ref >60.00)
Glucose, Bld: 119 mg/dL — ABNORMAL HIGH (ref 70–99)
Potassium: 4.4 mEq/L (ref 3.5–5.1)
Sodium: 138 mEq/L (ref 135–145)
Total Bilirubin: 0.9 mg/dL (ref 0.2–1.2)
Total Protein: 6.9 g/dL (ref 6.0–8.3)

## 2019-09-21 LAB — TSH: TSH: 1.48 u[IU]/mL (ref 0.35–4.50)

## 2019-09-21 LAB — VITAMIN B12: Vitamin B-12: 708 pg/mL (ref 211–911)

## 2019-09-21 LAB — VITAMIN D 25 HYDROXY (VIT D DEFICIENCY, FRACTURES): VITD: 52.75 ng/mL (ref 30.00–100.00)

## 2019-09-21 LAB — MAGNESIUM: Magnesium: 2.2 mg/dL (ref 1.5–2.5)

## 2019-09-21 MED ORDER — PROPRANOLOL HCL 40 MG PO TABS: 40.0000 mg | ORAL_TABLET | Freq: Two times a day (BID) | ORAL | Status: DC | PRN

## 2019-09-21 NOTE — Progress Notes (Signed)
This visit occurred during the SARS-CoV-2 public health emergency.  Safety protocols were in place, including screening questions prior to the visit, additional usage of staff PPE, and extensive cleaning of exam room while observing appropriate contact time as indicated for disinfecting solutions.  Concussion follow up.  She still has photophobia recurrently.  Migraine today.  She is using dark glasses and that helped, esp with fluorescent lights.  She clearly does better in natural lights.  She has had episodes of diffuse weakness/lightheadedness due to 1.  POTS vs 2.  Vertigo vs 3.  Episodes of falling/nearly falling w/o lightheadedness.  She had the 3rd type of episode in [place] recently.  It self resolved after sitting down, without falling.  Routine cautions discussed with patient.  She is rarely driving, only locally under controlled circumstances.  She hasn't graduated to the highway driving.  Her husband is working down in [place] for 2 years, back and forth in the meantime.  She is moving to another house in [place].  Her daughter is still down in [place].  She has rarely gone to the grocery store by herself, she usually needs help.  She wasn't able to do that before at all so she has made some progress but she is clearly not totally independent.  She was approved for disability, which was entirely reasonable.  We talked about this.  It is unfortunate that she needed in the first place but it is helpful to her in the meantime.  She is taking propranolol 22m BID at baseline recently.  Cautions d/w pt about POTS with recent heat and humidity.  She has had to titrate her propanolol in the meantime.    She saw pulmonary in the meantime about CPAP.  Compliant.  She has neurology f/u in the meantime.  MRI 2March 10, 2021IMPRESSION: No acute infarction, hemorrhage, or mass.  She had eye clinic follow up in the meantime.    Her mother died of covid 2Mar 10, 2021per patient report, d/w pt. Condolences offered.     Two weeks she had L foot burning, dorsally. She had been on her feet most of the day but no trauma.   Her sugar has been about 100 fasting.  No clear trigger for pain.  Skin still looked normal on the L foot when patient checked her foot.  She is on vit D and B12 now, restarted in the meantime.  The pain got better a few days later, resolved now.    She still thinks she has made some slow progress with her post-concussion sx.  However, she isn't able to go back to work.    Meds, vitals, and allergies reviewed.   ROS: Per HPI unless specifically indicated in ROS section   GEN: nad, alert and oriented, seen in darkened room. HEENT: ncat NECK: supple w/o LA CV: rrr PULM: ctab, no inc wob ABD: soft, +bs EXT: no edema SKIN: no acute rash CN 2-12 wnl B, S/S/DTR wnl x4 Gait is still slightly unsteady with walking without focal changes

## 2019-09-21 NOTE — Patient Instructions (Addendum)
Go to the lab on the way out.   If you have mychart we'll likely use that to update you.    Take care.  Glad to see you. Don't change your meds for now.   Thanks for your effort.

## 2019-09-24 DIAGNOSIS — R202 Paresthesia of skin: Secondary | ICD-10-CM | POA: Insufficient documentation

## 2019-09-24 NOTE — Assessment & Plan Note (Signed)
Clearly better now, without clear trigger.  See notes on labs. At least 30 minutes were devoted to patient care in this encounter (this can potentially include time spent reviewing the patient's file/history, interviewing and examining the patient, counseling/reviewing plan with patient, ordering referrals, ordering tests, reviewing relevant laboratory or x-ray data, and documenting the encounter).

## 2019-09-24 NOTE — Assessment & Plan Note (Signed)
Per pulmonary.  Continue CPAP.

## 2019-09-24 NOTE — Assessment & Plan Note (Signed)
She is made some progress as above but clearly is not back to her preinjury, independent baseline.  Continue with routine cautions.  She is not back to full driving.  She is using sunglasses as needed and she arises carefully from a chair.

## 2019-09-24 NOTE — Assessment & Plan Note (Signed)
History of, cautions discussed with patient.  Continue liberal fluid intake and beta-blocker.  She will follow up with [facility] as needed.

## 2019-10-04 DIAGNOSIS — M9901 Segmental and somatic dysfunction of cervical region: Secondary | ICD-10-CM | POA: Diagnosis not present

## 2019-10-04 DIAGNOSIS — M9902 Segmental and somatic dysfunction of thoracic region: Secondary | ICD-10-CM | POA: Diagnosis not present

## 2019-10-04 DIAGNOSIS — W19XXXA Unspecified fall, initial encounter: Secondary | ICD-10-CM | POA: Diagnosis not present

## 2019-10-04 DIAGNOSIS — M25551 Pain in right hip: Secondary | ICD-10-CM | POA: Diagnosis not present

## 2019-10-04 DIAGNOSIS — M545 Low back pain: Secondary | ICD-10-CM | POA: Diagnosis not present

## 2019-10-10 DIAGNOSIS — K760 Fatty (change of) liver, not elsewhere classified: Secondary | ICD-10-CM | POA: Diagnosis not present

## 2019-10-19 DIAGNOSIS — G4733 Obstructive sleep apnea (adult) (pediatric): Secondary | ICD-10-CM | POA: Diagnosis not present

## 2019-10-23 DIAGNOSIS — M9902 Segmental and somatic dysfunction of thoracic region: Secondary | ICD-10-CM | POA: Diagnosis not present

## 2019-10-23 DIAGNOSIS — M9904 Segmental and somatic dysfunction of sacral region: Secondary | ICD-10-CM | POA: Diagnosis not present

## 2019-10-23 DIAGNOSIS — M25551 Pain in right hip: Secondary | ICD-10-CM | POA: Diagnosis not present

## 2019-10-23 DIAGNOSIS — M9903 Segmental and somatic dysfunction of lumbar region: Secondary | ICD-10-CM | POA: Diagnosis not present

## 2019-10-23 DIAGNOSIS — M545 Low back pain: Secondary | ICD-10-CM | POA: Diagnosis not present

## 2019-10-24 DIAGNOSIS — R002 Palpitations: Secondary | ICD-10-CM | POA: Diagnosis not present

## 2019-10-24 DIAGNOSIS — G459 Transient cerebral ischemic attack, unspecified: Secondary | ICD-10-CM | POA: Diagnosis not present

## 2019-10-24 DIAGNOSIS — R0789 Other chest pain: Secondary | ICD-10-CM | POA: Diagnosis not present

## 2019-10-24 DIAGNOSIS — I498 Other specified cardiac arrhythmias: Secondary | ICD-10-CM | POA: Diagnosis not present

## 2019-11-27 DIAGNOSIS — R0789 Other chest pain: Secondary | ICD-10-CM | POA: Diagnosis not present

## 2019-11-27 DIAGNOSIS — I498 Other specified cardiac arrhythmias: Secondary | ICD-10-CM | POA: Diagnosis not present

## 2019-11-27 DIAGNOSIS — M545 Low back pain: Secondary | ICD-10-CM | POA: Diagnosis not present

## 2019-11-27 DIAGNOSIS — R002 Palpitations: Secondary | ICD-10-CM | POA: Diagnosis not present

## 2019-11-27 DIAGNOSIS — M9903 Segmental and somatic dysfunction of lumbar region: Secondary | ICD-10-CM | POA: Diagnosis not present

## 2019-11-27 DIAGNOSIS — M25551 Pain in right hip: Secondary | ICD-10-CM | POA: Diagnosis not present

## 2019-11-27 DIAGNOSIS — M9904 Segmental and somatic dysfunction of sacral region: Secondary | ICD-10-CM | POA: Diagnosis not present

## 2019-11-27 DIAGNOSIS — M9902 Segmental and somatic dysfunction of thoracic region: Secondary | ICD-10-CM | POA: Diagnosis not present

## 2019-12-07 DIAGNOSIS — Z20822 Contact with and (suspected) exposure to covid-19: Secondary | ICD-10-CM | POA: Diagnosis not present

## 2019-12-21 DIAGNOSIS — G4733 Obstructive sleep apnea (adult) (pediatric): Secondary | ICD-10-CM | POA: Diagnosis not present

## 2019-12-26 ENCOUNTER — Ambulatory Visit: Payer: Medicare Other | Admitting: Cardiovascular Disease

## 2020-01-17 DIAGNOSIS — G4733 Obstructive sleep apnea (adult) (pediatric): Secondary | ICD-10-CM | POA: Diagnosis not present

## 2020-01-19 DIAGNOSIS — M65311 Trigger thumb, right thumb: Secondary | ICD-10-CM | POA: Diagnosis not present

## 2020-01-20 DIAGNOSIS — G4733 Obstructive sleep apnea (adult) (pediatric): Secondary | ICD-10-CM | POA: Diagnosis not present

## 2020-02-21 ENCOUNTER — Ambulatory Visit: Payer: Medicare Other | Admitting: Cardiovascular Disease

## 2020-02-23 ENCOUNTER — Ambulatory Visit: Payer: Medicare Other | Admitting: Cardiovascular Disease

## 2020-03-06 ENCOUNTER — Encounter: Payer: Self-pay | Admitting: Pharmacist Clinician (PhC)/ Clinical Pharmacy Specialist

## 2020-03-06 ENCOUNTER — Other Ambulatory Visit: Payer: Self-pay

## 2020-03-06 ENCOUNTER — Ambulatory Visit (INDEPENDENT_AMBULATORY_CARE_PROVIDER_SITE_OTHER): Payer: Medicare Other | Admitting: Pharmacist Clinician (PhC)/ Clinical Pharmacy Specialist

## 2020-03-06 VITALS — BP 134/82 | HR 72 | Resp 16 | Ht 68.0 in | Wt 198.0 lb

## 2020-03-06 DIAGNOSIS — I10 Essential (primary) hypertension: Secondary | ICD-10-CM | POA: Diagnosis not present
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
## 2020-03-06 NOTE — Patient Instructions (Signed)
Average your daily (or at least 3-4 times each week) blood pressure readings for the next 2-3 months.  Keep an eye on the diastolic (bottom) reading, as we would ideally like to see this < 80.  If by February it is still running higher than that, please give [place] a call.  (Alysabeth Scalia/Raquel at [phone], [employer], [email or web address])  Take your meds as follows:  Continue with all your current medications  Bring all of your meds, your BP cuff and your record of home blood pressures to your next appointment.  Exercise as you're able, try to walk approximately 30 minutes per day.  Keep salt intake to a minimum, especially watch canned and prepared boxed foods.  Eat more fresh fruits and vegetables and fewer canned items.  Avoid eating in fast food restaurants.    HOW TO TAKE YOUR BLOOD PRESSURE: . Rest 5 minutes before taking your blood pressure. .  Don't smoke or drink caffeinated beverages for at least 30 minutes before. . Take your blood pressure before (not after) you eat. . Sit comfortably with your back supported and both feet on the floor (don't cross your legs). . Elevate your arm to heart level on a table or a desk. . Use the proper sized cuff. It should fit smoothly and snugly around your bare upper arm. There should be enough room to slip a fingertip under the cuff. The bottom edge of the cuff should be 1 inch above the crease of the elbow. . Ideally, take 3 measurements at one sitting and record the average.

## 2020-04-16 DIAGNOSIS — G4733 Obstructive sleep apnea (adult) (pediatric): Secondary | ICD-10-CM | POA: Diagnosis not present

## 2020-04-21 DIAGNOSIS — G4733 Obstructive sleep apnea (adult) (pediatric): Secondary | ICD-10-CM | POA: Diagnosis not present

## 2020-04-26 DIAGNOSIS — J0100 Acute maxillary sinusitis, unspecified: Secondary | ICD-10-CM | POA: Diagnosis not present

## 2020-05-02 DIAGNOSIS — U071 COVID-19: Secondary | ICD-10-CM | POA: Diagnosis not present

## 2020-05-02 DIAGNOSIS — R509 Fever, unspecified: Secondary | ICD-10-CM | POA: Diagnosis not present

## 2020-05-03 ENCOUNTER — Ambulatory Visit: Payer: Medicare Other | Admitting: Cardiovascular Disease

## 2020-05-15 ENCOUNTER — Other Ambulatory Visit: Payer: Self-pay | Admitting: Pharmacist Clinician (PhC)/ Clinical Pharmacy Specialist

## 2020-05-15 MED ORDER — VALSARTAN 80 MG PO TABS: 80.0000 mg | ORAL_TABLET | Freq: Every day | ORAL | 6 refills | Status: DC

## 2020-05-15 NOTE — Telephone Encounter (Signed)
Patient BP not dropping, having diastolic readings 99-278'S recently.  Will start valsartan 80 mg daily and have patient f/u in office in 3-4 weeks.

## 2020-05-22 DIAGNOSIS — G4733 Obstructive sleep apnea (adult) (pediatric): Secondary | ICD-10-CM | POA: Diagnosis not present

## 2020-06-07 ENCOUNTER — Ambulatory Visit (INDEPENDENT_AMBULATORY_CARE_PROVIDER_SITE_OTHER): Payer: Medicare Other | Admitting: Pharmacist Clinician (PhC)/ Clinical Pharmacy Specialist

## 2020-06-07 ENCOUNTER — Other Ambulatory Visit: Payer: Self-pay

## 2020-06-07 ENCOUNTER — Encounter: Payer: Self-pay | Admitting: Pharmacist Clinician (PhC)/ Clinical Pharmacy Specialist

## 2020-06-07 DIAGNOSIS — I10 Essential (primary) hypertension: Secondary | ICD-10-CM

## 2020-06-07 MED ORDER — VALSARTAN 80 MG PO TABS: 120.0000 mg | ORAL_TABLET | Freq: Every day | ORAL | 2 refills | Status: DC
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
## 2020-06-07 NOTE — Patient Instructions (Signed)
   Check your blood pressure at home daily (if able) and keep record of the readings.  Take your BP meds as follows:  Increase valsartan to 120 mg (1.5 tablets) once daily in the evenings  Continue with propranolol as needed.  If you notice your BP increasing or decreasing outside of your normal ranges, please reach out to [place] thru [place].    Bring all of your meds, your BP cuff and your record of home blood pressures to your next appointment.  Exercise as you're able, try to walk approximately 30 minutes per day.  Keep salt intake to a minimum, especially watch canned and prepared boxed foods.  Eat more fresh fruits and vegetables and fewer canned items.  Avoid eating in fast food restaurants.    HOW TO TAKE YOUR BLOOD PRESSURE: . Rest 5 minutes before taking your blood pressure. .  Don't smoke or drink caffeinated beverages for at least 30 minutes before. . Take your blood pressure before (not after) you eat. . Sit comfortably with your back supported and both feet on the floor (don't cross your legs). . Elevate your arm to heart level on a table or a desk. . Use the proper sized cuff. It should fit smoothly and snugly around your bare upper arm. There should be enough room to slip a fingertip under the cuff. The bottom edge of the cuff should be 1 inch above the crease of the elbow. . Ideally, take 3 measurements at one sitting and record the average.

## 2020-06-07 NOTE — Assessment & Plan Note (Signed)
Patient with essential hypertension, noting somewhat increased readings since having COVID earlier this month.  Will have her increase the valsartan to 120 mg once daily and continue with propranolol for POTS.  She will continue to monitor home BP readings and let us know thru MyChart if she develops any further issues with hypotension.  She has not been seen by Dr. Gwenlyn Found in over a year, so will have her follow up with him in 3 months, then we can continue to monitor her as needed after that.

## 2020-07-15 DIAGNOSIS — G4733 Obstructive sleep apnea (adult) (pediatric): Secondary | ICD-10-CM | POA: Diagnosis not present

## 2020-07-18 DIAGNOSIS — I10 Essential (primary) hypertension: Secondary | ICD-10-CM

## 2020-07-20 DIAGNOSIS — G4733 Obstructive sleep apnea (adult) (pediatric): Secondary | ICD-10-CM | POA: Diagnosis not present

## 2020-07-24 DIAGNOSIS — I10 Essential (primary) hypertension: Secondary | ICD-10-CM | POA: Diagnosis not present

## 2020-08-05 DIAGNOSIS — D485 Neoplasm of uncertain behavior of skin: Secondary | ICD-10-CM | POA: Diagnosis not present

## 2020-08-05 DIAGNOSIS — D225 Melanocytic nevi of trunk: Secondary | ICD-10-CM | POA: Diagnosis not present

## 2020-08-05 DIAGNOSIS — L820 Inflamed seborrheic keratosis: Secondary | ICD-10-CM | POA: Diagnosis not present

## 2020-08-05 DIAGNOSIS — L570 Actinic keratosis: Secondary | ICD-10-CM | POA: Diagnosis not present

## 2020-08-05 DIAGNOSIS — D1801 Hemangioma of skin and subcutaneous tissue: Secondary | ICD-10-CM | POA: Diagnosis not present

## 2020-08-05 DIAGNOSIS — Z85828 Personal history of other malignant neoplasm of skin: Secondary | ICD-10-CM | POA: Diagnosis not present

## 2020-08-05 DIAGNOSIS — L814 Other melanin hyperpigmentation: Secondary | ICD-10-CM | POA: Diagnosis not present

## 2020-08-05 DIAGNOSIS — L821 Other seborrheic keratosis: Secondary | ICD-10-CM | POA: Diagnosis not present

## 2020-08-06 DIAGNOSIS — H47323 Drusen of optic disc, bilateral: Secondary | ICD-10-CM | POA: Diagnosis not present

## 2020-08-06 DIAGNOSIS — N3010 Interstitial cystitis (chronic) without hematuria: Secondary | ICD-10-CM | POA: Diagnosis not present

## 2020-08-06 DIAGNOSIS — R310 Gross hematuria: Secondary | ICD-10-CM | POA: Diagnosis not present

## 2020-08-06 DIAGNOSIS — Z961 Presence of intraocular lens: Secondary | ICD-10-CM | POA: Diagnosis not present

## 2020-08-06 DIAGNOSIS — H53143 Visual discomfort, bilateral: Secondary | ICD-10-CM | POA: Diagnosis not present

## 2020-08-06 DIAGNOSIS — H5340 Unspecified visual field defects: Secondary | ICD-10-CM | POA: Diagnosis not present

## 2020-08-06 DIAGNOSIS — H3589 Other specified retinal disorders: Secondary | ICD-10-CM | POA: Diagnosis not present

## 2020-08-16 DIAGNOSIS — M76821 Posterior tibial tendinitis, right leg: Secondary | ICD-10-CM | POA: Diagnosis not present

## 2020-08-16 DIAGNOSIS — M25571 Pain in right ankle and joints of right foot: Secondary | ICD-10-CM | POA: Diagnosis not present

## 2020-08-19 DIAGNOSIS — G4733 Obstructive sleep apnea (adult) (pediatric): Secondary | ICD-10-CM | POA: Diagnosis not present

## 2020-09-03 DIAGNOSIS — M76821 Posterior tibial tendinitis, right leg: Secondary | ICD-10-CM | POA: Diagnosis not present

## 2020-09-06 ENCOUNTER — Encounter: Payer: Self-pay | Admitting: Cardiovascular Disease

## 2020-09-06 ENCOUNTER — Ambulatory Visit: Payer: Medicare Other | Admitting: Cardiovascular Disease

## 2020-09-06 ENCOUNTER — Other Ambulatory Visit: Payer: Self-pay

## 2020-09-06 VITALS — BP 132/64 | HR 68 | Ht 67.5 in | Wt 189.0 lb

## 2020-09-06 DIAGNOSIS — R0789 Other chest pain: Secondary | ICD-10-CM | POA: Diagnosis not present

## 2020-09-06 DIAGNOSIS — E785 Hyperlipidemia, unspecified: Secondary | ICD-10-CM

## 2020-09-06 DIAGNOSIS — I498 Other specified cardiac arrhythmias: Secondary | ICD-10-CM | POA: Diagnosis not present

## 2020-09-06 DIAGNOSIS — I10 Essential (primary) hypertension: Secondary | ICD-10-CM

## 2020-09-06 DIAGNOSIS — G90A Postural orthostatic tachycardia syndrome (POTS): Secondary | ICD-10-CM

## 2020-09-06 NOTE — Assessment & Plan Note (Signed)
History of POTS being taken care of in the past at the POTS clinic at [facility].  Her orthostatic symptoms have significantly improved.

## 2020-09-06 NOTE — Assessment & Plan Note (Signed)
History of dyslipidemia not on statin therapy and intolerant to statin drugs with lipid profile performed 05/02/2019 revealing total cholesterol 186, LDL 110 HDL 47.  She has gone to a "plant-based diet".  We will recheck a fasting lipid liver profile.

## 2020-09-06 NOTE — Patient Instructions (Signed)
Medication Instructions:  Your physician recommends that you continue on your current medications as directed. Please refer to the Current Medication list given to you today.  *If you need a refill on your cardiac medications before your next appointment, please call your pharmacy*   Lab Work: Your physician recommends that you return for lab work in: next 1-2 weeks for fasting lipid/liver profile.  If you have labs (blood work) drawn today and your tests are completely normal, you will receive your results only by: Marland Kitchen MyChart Message (if you have MyChart) OR . A paper copy in the mail If you have any lab test that is abnormal or we need to change your treatment, we will call you to review the results.   Follow-Up: At [facility], you and your health needs are our priority.  As part of our continuing mission to provide you with exceptional heart care, we have created designated Provider Care Teams.  These Care Teams include your primary Cardiologist (physician) and Advanced Practice Providers (APPs -  Physician Assistants and Nurse Practitioners) who all work together to provide you with the care you need, when you need it.  We recommend signing up for the patient portal called "MyChart".  Sign up information is provided on this After Visit Summary.  MyChart is used to connect with patients for Virtual Visits (Telemedicine).  Patients are able to view lab/test results, encounter notes, upcoming appointments, etc.  Non-urgent messages can be sent to your provider as well.   To learn more about what you can do with MyChart, go to [email or web address].    Your next appointment:   12 month(s)  The format for your next appointment:   In Person  Provider:   Quay Burow, MD

## 2020-09-06 NOTE — Assessment & Plan Note (Signed)
History of essential hypertension blood pressure measured today 132/64.  She is on valsartan and Inderal which she takes based on her blood pressure and heart rate.
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
## 2020-09-06 NOTE — Assessment & Plan Note (Signed)
History of atypical chest pain in the past with a normal cardiac catheterization which I performed 12/19/2010

## 2020-09-12 DIAGNOSIS — M25571 Pain in right ankle and joints of right foot: Secondary | ICD-10-CM | POA: Diagnosis not present

## 2020-09-18 DIAGNOSIS — M76821 Posterior tibial tendinitis, right leg: Secondary | ICD-10-CM | POA: Diagnosis not present

## 2020-09-19 DIAGNOSIS — G4733 Obstructive sleep apnea (adult) (pediatric): Secondary | ICD-10-CM | POA: Diagnosis not present

## 2020-10-15 DIAGNOSIS — G4733 Obstructive sleep apnea (adult) (pediatric): Secondary | ICD-10-CM | POA: Diagnosis not present

## 2020-11-18 DIAGNOSIS — M25571 Pain in right ankle and joints of right foot: Secondary | ICD-10-CM | POA: Diagnosis not present

## 2020-11-18 DIAGNOSIS — M25371 Other instability, right ankle: Secondary | ICD-10-CM | POA: Diagnosis not present

## 2020-11-19 DIAGNOSIS — I10 Essential (primary) hypertension: Secondary | ICD-10-CM | POA: Diagnosis not present

## 2020-11-19 DIAGNOSIS — G4733 Obstructive sleep apnea (adult) (pediatric): Secondary | ICD-10-CM | POA: Diagnosis not present

## 2020-11-20 ENCOUNTER — Telehealth: Payer: Self-pay

## 2020-11-20 NOTE — Telephone Encounter (Signed)
   Primary Cardiologist: Quay Burow, MD  Chart reviewed as part of pre-operative protocol coverage. Given past medical history and time since last visit, based on ACC/AHA guidelines, [name] would be at acceptable risk for the planned procedure without further cardiovascular testing.   Her RCRI is a class II risk, 0.9% risk of major cardiac event.  I will route this recommendation to the requesting party via Epic fax function and remove from pre-op pool.  Please call with questions.  Jossie Ng. Kilie Rund NP-C    11/20/2020, 4:41 PM [facility] [address] Office [phone] Fax [phone]

## 2020-11-20 NOTE — Telephone Encounter (Signed)
   [facility] Pre-operative Risk Assessment    Patient Name: [name]  DOB: 05/26/1965 MRN: [id number]   Request for surgical clearance:  What type of surgery is being performed Right ankle scope ligament repair,orifsyndesmosis   When is this surgery scheduled 12/20/20  What type of clearance is required Medical   Are there any medications that need to be held prior to surgery  None  Practice name and name of physician performing surgery [facility]  What is the office phone number  [phone]   7.   What is the office fax number         [phone]  8.   Anesthesia type  Not listed   Kathyrn Lass 11/20/2020, 3:30 PM  _________________________________________________________________   (provider comments below)

## 2020-12-06 DIAGNOSIS — M25571 Pain in right ankle and joints of right foot: Secondary | ICD-10-CM | POA: Diagnosis not present

## 2020-12-06 DIAGNOSIS — M76821 Posterior tibial tendinitis, right leg: Secondary | ICD-10-CM | POA: Diagnosis not present

## 2020-12-09 NOTE — Telephone Encounter (Signed)
  [name] from [place] stated that her office doesnt have access to Epic... needs clearance faxed to [phone]

## 2020-12-10 DIAGNOSIS — I498 Other specified cardiac arrhythmias: Secondary | ICD-10-CM | POA: Diagnosis not present

## 2020-12-10 DIAGNOSIS — I10 Essential (primary) hypertension: Secondary | ICD-10-CM | POA: Diagnosis not present

## 2020-12-13 DIAGNOSIS — Z20822 Contact with and (suspected) exposure to covid-19: Secondary | ICD-10-CM | POA: Diagnosis not present

## 2020-12-13 DIAGNOSIS — Z01812 Encounter for preprocedural laboratory examination: Secondary | ICD-10-CM | POA: Diagnosis not present

## 2020-12-17 DIAGNOSIS — M25571 Pain in right ankle and joints of right foot: Secondary | ICD-10-CM | POA: Diagnosis not present

## 2020-12-17 DIAGNOSIS — M65871 Other synovitis and tenosynovitis, right ankle and foot: Secondary | ICD-10-CM | POA: Diagnosis not present

## 2020-12-17 DIAGNOSIS — Z885 Allergy status to narcotic agent status: Secondary | ICD-10-CM | POA: Diagnosis not present

## 2020-12-17 DIAGNOSIS — S93431A Sprain of tibiofibular ligament of right ankle, initial encounter: Secondary | ICD-10-CM | POA: Diagnosis not present

## 2020-12-17 DIAGNOSIS — G4730 Sleep apnea, unspecified: Secondary | ICD-10-CM | POA: Diagnosis not present

## 2020-12-17 DIAGNOSIS — M25374 Other instability, right foot: Secondary | ICD-10-CM | POA: Diagnosis not present

## 2020-12-17 DIAGNOSIS — M24871 Other specific joint derangements of right ankle, not elsewhere classified: Secondary | ICD-10-CM | POA: Diagnosis not present

## 2020-12-17 DIAGNOSIS — Z888 Allergy status to other drugs, medicaments and biological substances status: Secondary | ICD-10-CM | POA: Diagnosis not present

## 2020-12-17 DIAGNOSIS — I498 Other specified cardiac arrhythmias: Secondary | ICD-10-CM | POA: Diagnosis not present

## 2020-12-17 DIAGNOSIS — M25371 Other instability, right ankle: Secondary | ICD-10-CM | POA: Diagnosis not present

## 2020-12-17 DIAGNOSIS — Z01810 Encounter for preprocedural cardiovascular examination: Secondary | ICD-10-CM | POA: Diagnosis not present

## 2020-12-17 DIAGNOSIS — Z882 Allergy status to sulfonamides status: Secondary | ICD-10-CM | POA: Diagnosis not present

## 2020-12-17 DIAGNOSIS — S93421D Sprain of deltoid ligament of right ankle, subsequent encounter: Secondary | ICD-10-CM | POA: Diagnosis not present

## 2020-12-17 DIAGNOSIS — I10 Essential (primary) hypertension: Secondary | ICD-10-CM | POA: Diagnosis not present

## 2020-12-27 DIAGNOSIS — M25371 Other instability, right ankle: Secondary | ICD-10-CM | POA: Diagnosis not present

## 2020-12-27 DIAGNOSIS — S93431D Sprain of tibiofibular ligament of right ankle, subsequent encounter: Secondary | ICD-10-CM | POA: Diagnosis not present

## 2021-01-01 DIAGNOSIS — M25374 Other instability, right foot: Secondary | ICD-10-CM | POA: Diagnosis not present

## 2021-01-01 DIAGNOSIS — S93421D Sprain of deltoid ligament of right ankle, subsequent encounter: Secondary | ICD-10-CM | POA: Diagnosis not present

## 2021-01-13 DIAGNOSIS — G4733 Obstructive sleep apnea (adult) (pediatric): Secondary | ICD-10-CM | POA: Diagnosis not present

## 2021-01-20 DIAGNOSIS — M6249 Contracture of muscle, multiple sites: Secondary | ICD-10-CM | POA: Diagnosis not present

## 2021-01-20 DIAGNOSIS — M9903 Segmental and somatic dysfunction of lumbar region: Secondary | ICD-10-CM | POA: Diagnosis not present

## 2021-01-20 DIAGNOSIS — M545 Low back pain, unspecified: Secondary | ICD-10-CM | POA: Diagnosis not present

## 2021-01-29 DIAGNOSIS — M6249 Contracture of muscle, multiple sites: Secondary | ICD-10-CM | POA: Diagnosis not present

## 2021-01-29 DIAGNOSIS — M9903 Segmental and somatic dysfunction of lumbar region: Secondary | ICD-10-CM | POA: Diagnosis not present

## 2021-01-29 DIAGNOSIS — M545 Low back pain, unspecified: Secondary | ICD-10-CM | POA: Diagnosis not present

## 2021-01-30 DIAGNOSIS — M25571 Pain in right ankle and joints of right foot: Secondary | ICD-10-CM | POA: Diagnosis not present

## 2021-01-30 DIAGNOSIS — S93431D Sprain of tibiofibular ligament of right ankle, subsequent encounter: Secondary | ICD-10-CM | POA: Diagnosis not present

## 2021-01-30 DIAGNOSIS — M25374 Other instability, right foot: Secondary | ICD-10-CM | POA: Diagnosis not present

## 2021-01-30 DIAGNOSIS — S93421D Sprain of deltoid ligament of right ankle, subsequent encounter: Secondary | ICD-10-CM | POA: Diagnosis not present

## 2021-02-03 DIAGNOSIS — M545 Low back pain, unspecified: Secondary | ICD-10-CM | POA: Diagnosis not present

## 2021-02-03 DIAGNOSIS — M6249 Contracture of muscle, multiple sites: Secondary | ICD-10-CM | POA: Diagnosis not present

## 2021-02-03 DIAGNOSIS — M9903 Segmental and somatic dysfunction of lumbar region: Secondary | ICD-10-CM | POA: Diagnosis not present

## 2021-02-03 DIAGNOSIS — M25571 Pain in right ankle and joints of right foot: Secondary | ICD-10-CM | POA: Diagnosis not present

## 2021-02-17 DIAGNOSIS — M25571 Pain in right ankle and joints of right foot: Secondary | ICD-10-CM | POA: Diagnosis not present

## 2021-02-19 ENCOUNTER — Other Ambulatory Visit: Payer: Self-pay | Admitting: Family Medicine

## 2021-02-19 MED ORDER — ONETOUCH ULTRA VI STRP: ORAL_STRIP | 1 refills | Status: DC

## 2021-02-19 NOTE — Telephone Encounter (Signed)
Sent. Thanks.   

## 2021-02-19 NOTE — Telephone Encounter (Signed)
Refill request for One touch test strips  LOV - 09/21/19 Next OV - not scheduled Last refill - 08/29/19 #100/3

## 2021-02-25 DIAGNOSIS — M25571 Pain in right ankle and joints of right foot: Secondary | ICD-10-CM | POA: Diagnosis not present

## 2021-03-03 DIAGNOSIS — M25571 Pain in right ankle and joints of right foot: Secondary | ICD-10-CM | POA: Diagnosis not present

## 2021-03-04 DIAGNOSIS — M6249 Contracture of muscle, multiple sites: Secondary | ICD-10-CM | POA: Diagnosis not present

## 2021-03-04 DIAGNOSIS — M9903 Segmental and somatic dysfunction of lumbar region: Secondary | ICD-10-CM | POA: Diagnosis not present

## 2021-03-04 DIAGNOSIS — M545 Low back pain, unspecified: Secondary | ICD-10-CM | POA: Diagnosis not present

## 2021-03-10 DIAGNOSIS — M6249 Contracture of muscle, multiple sites: Secondary | ICD-10-CM | POA: Diagnosis not present

## 2021-03-10 DIAGNOSIS — M545 Low back pain, unspecified: Secondary | ICD-10-CM | POA: Diagnosis not present

## 2021-03-10 DIAGNOSIS — M25571 Pain in right ankle and joints of right foot: Secondary | ICD-10-CM | POA: Diagnosis not present

## 2021-03-10 DIAGNOSIS — M9903 Segmental and somatic dysfunction of lumbar region: Secondary | ICD-10-CM | POA: Diagnosis not present

## 2021-03-17 DIAGNOSIS — S93431D Sprain of tibiofibular ligament of right ankle, subsequent encounter: Secondary | ICD-10-CM | POA: Diagnosis not present

## 2021-03-17 DIAGNOSIS — M6249 Contracture of muscle, multiple sites: Secondary | ICD-10-CM | POA: Diagnosis not present

## 2021-03-17 DIAGNOSIS — M545 Low back pain, unspecified: Secondary | ICD-10-CM | POA: Diagnosis not present

## 2021-03-17 DIAGNOSIS — M25374 Other instability, right foot: Secondary | ICD-10-CM | POA: Diagnosis not present

## 2021-03-17 DIAGNOSIS — M9903 Segmental and somatic dysfunction of lumbar region: Secondary | ICD-10-CM | POA: Diagnosis not present

## 2021-03-20 DIAGNOSIS — M25571 Pain in right ankle and joints of right foot: Secondary | ICD-10-CM | POA: Diagnosis not present

## 2021-03-24 DIAGNOSIS — M25571 Pain in right ankle and joints of right foot: Secondary | ICD-10-CM | POA: Diagnosis not present

## 2021-04-01 ENCOUNTER — Other Ambulatory Visit: Payer: Self-pay | Admitting: Family Medicine

## 2021-04-01 NOTE — Telephone Encounter (Signed)
Refill request for one touch delica lancets  LOV - 5/46/27 Next OV - not scheduled Last refill - 08/29/19 #100/3

## 2021-04-03 ENCOUNTER — Other Ambulatory Visit: Payer: Self-pay | Admitting: Family Medicine

## 2021-04-14 ENCOUNTER — Other Ambulatory Visit: Payer: Self-pay | Admitting: Family Medicine

## 2021-04-15 DIAGNOSIS — M25571 Pain in right ankle and joints of right foot: Secondary | ICD-10-CM | POA: Diagnosis not present

## 2021-04-22 DIAGNOSIS — G4733 Obstructive sleep apnea (adult) (pediatric): Secondary | ICD-10-CM | POA: Diagnosis not present

## 2021-04-25 LAB — HM PAP SMEAR

## 2021-04-28 DIAGNOSIS — M25571 Pain in right ankle and joints of right foot: Secondary | ICD-10-CM | POA: Diagnosis not present

## 2021-05-13 ENCOUNTER — Encounter: Payer: Self-pay | Admitting: Pharmacist Clinician (PhC)/ Clinical Pharmacy Specialist

## 2021-06-25 DIAGNOSIS — M545 Low back pain, unspecified: Secondary | ICD-10-CM | POA: Diagnosis not present

## 2021-06-25 DIAGNOSIS — M9903 Segmental and somatic dysfunction of lumbar region: Secondary | ICD-10-CM | POA: Diagnosis not present

## 2021-06-25 DIAGNOSIS — M6249 Contracture of muscle, multiple sites: Secondary | ICD-10-CM | POA: Diagnosis not present

## 2021-06-30 ENCOUNTER — Ambulatory Visit: Payer: Medicare Other | Admitting: Family Medicine

## 2021-06-30 ENCOUNTER — Other Ambulatory Visit: Payer: Self-pay

## 2021-06-30 ENCOUNTER — Ambulatory Visit (INDEPENDENT_AMBULATORY_CARE_PROVIDER_SITE_OTHER): Payer: Medicare Other | Admitting: Family Medicine

## 2021-06-30 ENCOUNTER — Encounter: Payer: Self-pay | Admitting: Family Medicine

## 2021-06-30 DIAGNOSIS — F0781 Postconcussional syndrome: Secondary | ICD-10-CM

## 2021-06-30 DIAGNOSIS — Z0000 Encounter for general adult medical examination without abnormal findings: Secondary | ICD-10-CM

## 2021-06-30 DIAGNOSIS — G901 Familial dysautonomia [Riley-Day]: Secondary | ICD-10-CM | POA: Diagnosis not present

## 2021-06-30 DIAGNOSIS — Z7189 Other specified counseling: Secondary | ICD-10-CM

## 2021-06-30 DIAGNOSIS — K760 Fatty (change of) liver, not elsewhere classified: Secondary | ICD-10-CM | POA: Diagnosis not present

## 2021-06-30 MED ORDER — VALSARTAN 80 MG PO TABS: 40.0000 mg | ORAL_TABLET | Freq: Every day | ORAL | Status: DC

## 2021-06-30 NOTE — Progress Notes (Signed)
This visit occurred during the SARS-CoV-2 public health emergency.  Safety protocols were in place, including screening questions prior to the visit, additional usage of staff PPE, and extensive cleaning of exam room while observing appropriate contact time as indicated for disinfecting solutions. ? ?[name].  Still having persistent photophobia.  Seen in dim light today at OV.  Still using dark glasses.  Symptoms noted since TBI.  She has prev noted improvement in other areas but photophobia and phonophobia persist.  With sig visual/sound stimulation, she'll have overstimulation and balance changes.  Anxiety d/w pt.   ? ?Still followed at [place] re: POTS/hepatology.  Rarely on valsartan, if BP is elevated. Still using CPAP and propranolol.  She had labs done at outside clinic, requesting records.   ? ?Also had R ankle injury and surgery.  Still doing home exercises.   ? ?[name] had a baby son.  Congrats to patient.  ? ?Currently splitting time in [place] and [place].   ? ?Colonoscopy 2016, with plan for 10 year f/u.  ?Breast cancer screening done 04/2020.   ?Living will d/w pt. Husband designated if patient were incapacitated.  ? ?Meds, vitals, and allergies reviewed.  ? ?ROS: Per HPI unless specifically indicated in ROS section  ? ?GEN: nad, alert and oriented, wearing sunglasses. ?HEENT: ncat ?NECK: supple w/o LA ?CV: rrr.  ?PULM: ctab, no inc wob ?ABD: soft, +bs ?EXT: no edema ?SKIN:well perfused.   ? ?27 minutes were devoted to patient care in this encounter (this includes time spent reviewing the patient's file/history, interviewing and examining the patient, counseling/reviewing plan with patient).  ? ?

## 2021-06-30 NOTE — Patient Instructions (Addendum)
Ask the front to get a record release from Dr. Helane Rima.  ?Take care.  Glad to see you. ?Update me as needed.   ?

## 2021-07-01 DIAGNOSIS — K760 Fatty (change of) liver, not elsewhere classified: Secondary | ICD-10-CM | POA: Diagnosis not present

## 2021-07-01 DIAGNOSIS — R748 Abnormal levels of other serum enzymes: Secondary | ICD-10-CM | POA: Diagnosis not present

## 2021-07-02 DIAGNOSIS — Z0000 Encounter for general adult medical examination without abnormal findings: Secondary | ICD-10-CM | POA: Insufficient documentation

## 2021-07-02 NOTE — Assessment & Plan Note (Addendum)
[place] clinic.  I will defer. ?
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
## 2021-07-02 NOTE — Assessment & Plan Note (Signed)
Colonoscopy 2016, with plan for 10 year f/u.  ?Breast cancer screening done 04/2020.   ?Living will d/w pt. Husband designated if patient were incapacitated.  ?

## 2021-07-02 NOTE — Assessment & Plan Note (Signed)
Per [facility] clinic.  I will defer. ?

## 2021-07-03 DIAGNOSIS — S93421A Sprain of deltoid ligament of right ankle, initial encounter: Secondary | ICD-10-CM | POA: Diagnosis not present

## 2021-07-06 NOTE — Addendum Note (Signed)
Addended by: Tonia Ghent on: 07/06/2021 03:29 PM ? ? Modules accepted: Level of Service ? ?

## 2021-07-28 DIAGNOSIS — R6889 Other general symptoms and signs: Secondary | ICD-10-CM | POA: Diagnosis not present

## 2021-07-28 DIAGNOSIS — H905 Unspecified sensorineural hearing loss: Secondary | ICD-10-CM | POA: Diagnosis not present

## 2021-07-28 DIAGNOSIS — H93A3 Pulsatile tinnitus, bilateral: Secondary | ICD-10-CM | POA: Diagnosis not present

## 2021-07-30 DIAGNOSIS — S93421A Sprain of deltoid ligament of right ankle, initial encounter: Secondary | ICD-10-CM | POA: Diagnosis not present

## 2021-07-30 DIAGNOSIS — Z9889 Other specified postprocedural states: Secondary | ICD-10-CM | POA: Diagnosis not present

## 2021-07-30 DIAGNOSIS — M25571 Pain in right ankle and joints of right foot: Secondary | ICD-10-CM | POA: Diagnosis not present

## 2021-07-30 DIAGNOSIS — L905 Scar conditions and fibrosis of skin: Secondary | ICD-10-CM | POA: Diagnosis not present

## 2021-07-31 DIAGNOSIS — S93421D Sprain of deltoid ligament of right ankle, subsequent encounter: Secondary | ICD-10-CM | POA: Diagnosis not present

## 2021-08-07 DIAGNOSIS — G4733 Obstructive sleep apnea (adult) (pediatric): Secondary | ICD-10-CM | POA: Diagnosis not present

## 2021-08-08 DIAGNOSIS — H5340 Unspecified visual field defects: Secondary | ICD-10-CM | POA: Diagnosis not present

## 2021-08-08 DIAGNOSIS — H53453 Other localized visual field defect, bilateral: Secondary | ICD-10-CM | POA: Diagnosis not present

## 2021-08-08 DIAGNOSIS — H47323 Drusen of optic disc, bilateral: Secondary | ICD-10-CM | POA: Diagnosis not present

## 2021-08-08 DIAGNOSIS — G514 Facial myokymia: Secondary | ICD-10-CM | POA: Diagnosis not present

## 2021-08-08 DIAGNOSIS — Z961 Presence of intraocular lens: Secondary | ICD-10-CM | POA: Diagnosis not present

## 2021-08-08 DIAGNOSIS — Z87820 Personal history of traumatic brain injury: Secondary | ICD-10-CM | POA: Diagnosis not present

## 2021-08-25 ENCOUNTER — Telehealth: Payer: Self-pay | Admitting: Family Medicine

## 2021-08-25 NOTE — Telephone Encounter (Signed)
?  Left message for patient to call back and schedule Medicare Annual Wellness Visit (AWV) to be completed by video or phone. ? ?No hx of AWV eligible for AWVI per [facility] as of 05/03/09 ? ?Please schedule at anytime with  ?[place] ?88 Minutes appointment  ? ?Any questions, please call me at [phone]   ?

## 2021-09-02 DIAGNOSIS — L812 Freckles: Secondary | ICD-10-CM | POA: Diagnosis not present

## 2021-09-02 DIAGNOSIS — L821 Other seborrheic keratosis: Secondary | ICD-10-CM | POA: Diagnosis not present

## 2021-09-02 DIAGNOSIS — Z85828 Personal history of other malignant neoplasm of skin: Secondary | ICD-10-CM | POA: Diagnosis not present

## 2021-09-02 DIAGNOSIS — D225 Melanocytic nevi of trunk: Secondary | ICD-10-CM | POA: Diagnosis not present

## 2021-09-02 DIAGNOSIS — D2261 Melanocytic nevi of right upper limb, including shoulder: Secondary | ICD-10-CM | POA: Diagnosis not present

## 2021-09-02 DIAGNOSIS — D1801 Hemangioma of skin and subcutaneous tissue: Secondary | ICD-10-CM | POA: Diagnosis not present

## 2021-10-03 ENCOUNTER — Ambulatory Visit (INDEPENDENT_AMBULATORY_CARE_PROVIDER_SITE_OTHER): Payer: Medicare Other | Admitting: Family Medicine

## 2021-10-03 ENCOUNTER — Encounter: Payer: Self-pay | Admitting: Family Medicine

## 2021-10-03 ENCOUNTER — Other Ambulatory Visit: Payer: Self-pay | Admitting: Family Medicine

## 2021-10-03 DIAGNOSIS — F0781 Postconcussional syndrome: Secondary | ICD-10-CM

## 2021-10-03 MED ORDER — ONETOUCH DELICA PLUS LANCET30G MISC: 3 refills | Status: DC

## 2021-10-03 MED ORDER — ONETOUCH ULTRA VI STRP: ORAL_STRIP | 3 refills | Status: DC

## 2021-11-03 DIAGNOSIS — S93421D Sprain of deltoid ligament of right ankle, subsequent encounter: Secondary | ICD-10-CM | POA: Diagnosis not present

## 2021-11-03 DIAGNOSIS — M25374 Other instability, right foot: Secondary | ICD-10-CM | POA: Diagnosis not present

## 2021-11-06 ENCOUNTER — Ambulatory Visit (INDEPENDENT_AMBULATORY_CARE_PROVIDER_SITE_OTHER): Payer: Medicare Other

## 2021-11-06 VITALS — Ht 67.5 in | Wt 185.0 lb

## 2021-11-06 DIAGNOSIS — Z0000 Encounter for general adult medical examination without abnormal findings: Secondary | ICD-10-CM

## 2021-11-06 NOTE — Patient Instructions (Signed)
[name] , Thank you for taking time to come for your Medicare Wellness Visit. I appreciate your ongoing commitment to your health goals. Please review the following plan we discussed and let me know if I can assist you in the future.   Screening recommendations/referrals: Colonoscopy: completed 11/05/2014, due 11/04/2024 Mammogram: due 2024 Bone Density:  Recommended yearly ophthalmology/optometry visit for glaucoma screening and checkup Recommended yearly dental visit for hygiene and checkup  Vaccinations: Influenza vaccine: decline Pneumococcal vaccine: n/a Tdap vaccine: n/a Shingles vaccine: n/a  Covid-19: n/a  Advanced directives: Please bring a copy of your POA (Power of Attorney) and/or Living Will to your next appointment.   Conditions/risks identified: none  Next appointment: Follow up in one year for your annual wellness visit.   Preventive Care 40-64 Years, Female Preventive care refers to lifestyle choices and visits with your health care provider that can promote health and wellness. What does preventive care include? A yearly physical exam. This is also called an annual well check. Dental exams once or twice a year. Routine eye exams. Ask your health care provider how often you should have your eyes checked. Personal lifestyle choices, including: Daily care of your teeth and gums. Regular physical activity. Eating a healthy diet. Avoiding tobacco and drug use. Limiting alcohol use. Practicing safe sex. Taking low-dose aspirin daily starting at age 73. Taking vitamin and mineral supplements as recommended by your health care provider. What happens during an annual well check? The services and screenings done by your health care provider during your annual well check will depend on your age, overall health, lifestyle risk factors, and family history of disease. Counseling  Your health care provider may ask you questions about your: Alcohol use. Tobacco  use. Drug use. Emotional well-being. Home and relationship well-being. Sexual activity. Eating habits. Work and work Statistician. Method of birth control. Menstrual cycle. Pregnancy history. Screening  You may have the following tests or measurements: Height, weight, and BMI. Blood pressure. Lipid and cholesterol levels. These may be checked every 5 years, or more frequently if you are over 42 years old. Skin check. Lung cancer screening. You may have this screening every year starting at age 29 if you have a 30-pack-year history of smoking and currently smoke or have quit within the past 15 years. Fecal occult blood test (FOBT) of the stool. You may have this test every year starting at age 33. Flexible sigmoidoscopy or colonoscopy. You may have a sigmoidoscopy every 5 years or a colonoscopy every 10 years starting at age 64. Hepatitis C blood test. Hepatitis B blood test. Sexually transmitted disease (STD) testing. Diabetes screening. This is done by checking your blood sugar (glucose) after you have not eaten for a while (fasting). You may have this done every 1-3 years. Mammogram. This may be done every 1-2 years. Talk to your health care provider about when you should start having regular mammograms. This may depend on whether you have a family history of breast cancer. BRCA-related cancer screening. This may be done if you have a family history of breast, ovarian, tubal, or peritoneal cancers. Pelvic exam and Pap test. This may be done every 3 years starting at age 62. Starting at age 79, this may be done every 5 years if you have a Pap test in combination with an HPV test. Bone density scan. This is done to screen for osteoporosis. You may have this scan if you are at high risk for osteoporosis. Discuss your test results, treatment  options, and if necessary, the need for more tests with your health care provider. Vaccines  Your health care provider may recommend certain vaccines,  such as: Influenza vaccine. This is recommended every year. Tetanus, diphtheria, and acellular pertussis (Tdap, Td) vaccine. You may need a Td booster every 10 years. Zoster vaccine. You may need this after age 66. Pneumococcal 13-valent conjugate (PCV13) vaccine. You may need this if you have certain conditions and were not previously vaccinated. Pneumococcal polysaccharide (PPSV23) vaccine. You may need one or two doses if you smoke cigarettes or if you have certain conditions. Talk to your health care provider about which screenings and vaccines you need and how often you need them. This information is not intended to replace advice given to you by your health care provider. Make sure you discuss any questions you have with your health care provider. Document Released: 04/26/2015 Document Revised: 12/18/2015 Document Reviewed: 01/29/2015 Elsevier Interactive Patient Education  2017 [employer] Prevention in the Home Falls can cause injuries. They can happen to people of all ages. There are many things you can do to make your home safe and to help prevent falls. What can I do on the outside of my home? Regularly fix the edges of walkways and driveways and fix any cracks. Remove anything that might make you trip as you walk through a door, such as a raised step or threshold. Trim any bushes or trees on the path to your home. Use bright outdoor lighting. Clear any walking paths of anything that might make someone trip, such as rocks or tools. Regularly check to see if handrails are loose or broken. Make sure that both sides of any steps have handrails. Any raised decks and porches should have guardrails on the edges. Have any leaves, snow, or ice cleared regularly. Use sand or salt on walking paths during winter. Clean up any spills in your garage right away. This includes oil or grease spills. What can I do in the bathroom? Use night lights. Install grab bars by the toilet and  in the tub and shower. Do not use towel bars as grab bars. Use non-skid mats or decals in the tub or shower. If you need to sit down in the shower, use a plastic, non-slip stool. Keep the floor dry. Clean up any water that spills on the floor as soon as it happens. Remove soap buildup in the tub or shower regularly. Attach bath mats securely with double-sided non-slip rug tape. Do not have throw rugs and other things on the floor that can make you trip. What can I do in the bedroom? Use night lights. Make sure that you have a light by your bed that is easy to reach. Do not use any sheets or blankets that are too big for your bed. They should not hang down onto the floor. Have a firm chair that has side arms. You can use this for support while you get dressed. Do not have throw rugs and other things on the floor that can make you trip. What can I do in the kitchen? Clean up any spills right away. Avoid walking on wet floors. Keep items that you use a lot in easy-to-reach places. If you need to reach something above you, use a strong step stool that has a grab bar. Keep electrical cords out of the way. Do not use floor polish or wax that makes floors slippery. If you must use wax, use non-skid floor wax. Do  not have throw rugs and other things on the floor that can make you trip. What can I do with my stairs? Do not leave any items on the stairs. Make sure that there are handrails on both sides of the stairs and use them. Fix handrails that are broken or loose. Make sure that handrails are as long as the stairways. Check any carpeting to make sure that it is firmly attached to the stairs. Fix any carpet that is loose or worn. Avoid having throw rugs at the top or bottom of the stairs. If you do have throw rugs, attach them to the floor with carpet tape. Make sure that you have a light switch at the top of the stairs and the bottom of the stairs. If you do not have them, ask someone to add  them for you. What else can I do to help prevent falls? Wear shoes that: Do not have high heels. Have rubber bottoms. Are comfortable and fit you well. Are closed at the toe. Do not wear sandals. If you use a stepladder: Make sure that it is fully opened. Do not climb a closed stepladder. Make sure that both sides of the stepladder are locked into place. Ask someone to hold it for you, if possible. Clearly mark and make sure that you can see: Any grab bars or handrails. First and last steps. Where the edge of each step is. Use tools that help you move around (mobility aids) if they are needed. These include: Canes. Walkers. Scooters. Crutches. Turn on the lights when you go into a dark area. Replace any light bulbs as soon as they burn out. Set up your furniture so you have a clear path. Avoid moving your furniture around. If any of your floors are uneven, fix them. If there are any pets around you, be aware of where they are. Review your medicines with your doctor. Some medicines can make you feel dizzy. This can increase your chance of falling. Ask your doctor what other things that you can do to help prevent falls. This information is not intended to replace advice given to you by your health care provider. Make sure you discuss any questions you have with your health care provider. Document Released: 01/24/2009 Document Revised: 09/05/2015 Document Reviewed: 05/04/2014 Elsevier Interactive Patient Education  2017 [employer].

## 2021-11-06 NOTE — Progress Notes (Signed)
I connected with [name] today by telephone and verified that I am speaking with the correct person using two identifiers. Location patient: home Location provider: work Persons participating in the virtual visit: Helyne, Genther LPN.   I discussed the limitations, risks, security and privacy concerns of performing an evaluation and management service by telephone and the availability of in person appointments. I also discussed with the patient that there may be a patient responsible charge related to this service. The patient expressed understanding and verbally consented to this telephonic visit.    Interactive audio and video telecommunications were attempted between this provider and patient, however failed, due to patient having technical difficulties OR patient did not have access to video capability.  We continued and completed visit with audio only.     Vital signs may be patient reported or missing.  Subjective:   [name] is a 56 y.o. female who presents for an Initial Medicare Annual Wellness Visit.  Review of Systems     Cardiac Risk Factors include: dyslipidemia;hypertension     Objective:    Today's Vitals   11/06/21 1527  Weight: 185 lb (83.9 kg)  Height: 5' 7.5" (1.715 m)   Body mass index is 28.55 kg/m.     11/06/2021    3:32 PM 05/01/2019   11:50 PM 05/01/2019    8:01 PM 11/05/2014    2:32 PM 04/18/2012    1:48 PM  Advanced Directives  Does Patient Have a Medical Advance Directive? Yes Yes No No Patient does not have advance directive;Patient would not like information  Type of Scientist, forensic Power of [place];Living will [place]     Does patient want to make changes to medical advance directive?  Yes (ED - Information included in AVS)     Copy of [place] in Chart? No - copy requested No - copy requested     Would patient like information on creating a  medical advance directive?    No - patient declined information     Current Medications (verified) Outpatient Encounter Medications as of 11/06/2021  Medication Sig   Barberry-Oreg Grape-Goldenseal (BERBERINE COMPLEX PO) Take 1 capsule by mouth daily.   cholecalciferol (VITAMIN D3) 25 MCG (1000 UNIT) tablet Take 5,000 Units by mouth daily.   Cranberry 1000 MG CAPS Take by mouth daily.   glucose blood (ONETOUCH ULTRA) test strip Use as instructed to check blood sugar once daily   Lancets (ONETOUCH DELICA PLUS [id number]) MISC USE AS INSTRUCTED TO CHECK BLOOD SUGAR ONCE DAILY.   Magnesium 400 MG CAPS Take by mouth.   Methylcobalamin (B12-ACTIVE PO) Take 1 tablet by mouth daily.   propranolol (INDERAL) 40 MG tablet Take 1 tablet (40 mg total) by mouth 2 (two) times daily as needed.   valsartan (DIOVAN) 80 MG tablet Take 0.5 tablets (40 mg total) by mouth daily.   vitamin C (ASCORBIC ACID) 250 MG tablet Take 250 mg by mouth daily.   No facility-administered encounter medications on file as of 11/06/2021.    Allergies (verified) Nitrofurantoin, Advair diskus [fluticasone-salmeterol], Barbiturates, Cefuroxime axetil, Clarithromycin, Codeine, Demerol [meperidine], Food, Gabapentin, and Oxybutynin   History: Past Medical History:  Diagnosis Date   B12 deficiency    Cirrhosis of liver not due to alcohol ([place])    tx at [facility]   Compression fracture of cervical vertebra (HCC)    C5-6   Diabetes mellitus    DIET CONTROLLED  Diverticulitis    04/2008   Dysautonomia ([place])    Dysrhythmia    POTS (postural orthostatic tachycardia syndrome)   Fatty liver    GERD (gastroesophageal reflux disease)    Hyperlipidemia    Hypertension    Hyperthyroidism    f/u testing neg   Kidney cyst, acquired    benign "lesion"   Mononucleosis 2005   NASH (nonalcoholic steatohepatitis)    Neuromuscular disorder ([place])    DR LOVE HAD 1 EPISODE 2010 INVOL MOVEMENT  LT ARM    OSA (obstructive sleep  apnea)    Pernicious anemia    Pneumonia    HX PNEUMONIA    Pneumonia    PONV (postoperative nausea and vomiting)    1994 HYST  WOKE UP     POTS (postural orthostatic tachycardia syndrome)    RLS (restless legs syndrome)    Seizures (HCC)    Sleep apnea    Steatosis of liver    Thyroid nodule    TIA (transient ischemic attack) 04/2019    hospital   Treadmill stress test negative for angina pectoris 01/2000   Past Surgical History:  Procedure Laterality Date   ABDOMINAL HYSTERECTOMY     ANKLE SURGERY Right    12/2020   APPENDECTOMY     BLADDER REPAIR  04/13/2001   CARDIAC CATHETERIZATION     12/2010 with Dr. Gwenlyn Found, normal   CARDIOVASCULAR STRESS TEST  07/13/2006   Negative, (equivocal), increased uptake of radiotracer mid back   CARDIOVASCULAR STRESS TEST     11/2010, no ischemia   CHOLECYSTECTOMY     04/14/12   IN [facility]   COLONOSCOPY WITH PROPOFOL N/A 11/05/2014   Procedure: COLONOSCOPY WITH PROPOFOL;  Surgeon: Lollie Sails, MD;  Location: [facility] ENDOSCOPY;  Service: Endoscopy;  Laterality: N/A;   DOPPLER ECHOCARDIOGRAPHY     small effusion, but normal o/w   ESOPHAGOGASTRODUODENOSCOPY  03/13/2000   Negative   LUMBAR LAMINECTOMY/DECOMPRESSION MICRODISCECTOMY  04/20/2012   Procedure: LUMBAR LAMINECTOMY/DECOMPRESSION MICRODISCECTOMY 1 LEVEL;  Surgeon: W[place]ield Cunas, MD;  Location: [place] [facility]URO ORS;  Service: Neurosurgery;  Laterality: Left;  LEFT Lumbar five-Sacral one diskectomy   pubic vaginal sling     2002, 2006   Stress cardiolite  12/13/2002   Negative   TONSILLECTOMY     TONSILLECTOMY     US abdominal  08/2003 & 01/2005   Fatty liver (both times)   Family History  Problem Relation Age of Onset   Stroke Mother    Hypertension Mother    Heart disease Father        2 MI's   Bladder Cancer Father    Diabetes Other 67   Cancer Other    Hypertension Other    Hypertension Maternal Grandmother    Stroke Maternal Grandmother    Heart disease  Maternal Grandfather    Hypertension Maternal Grandfather    Stroke Maternal Grandfather    Heart disease Paternal Grandfather    Colon cancer Neg Hx    Breast cancer Neg Hx    Social History   Socioeconomic History   Marital status: Married    Spouse name: [name]   Number of children: 2   Years of education: 14   Highest education level: Not on file  Occupational History    Comment: [employer]  Tobacco Use   Smoking status: Never   Smokeless tobacco: Never  Vaping Use   Vaping Use: Never used  Substance and Sexual Activity   Alcohol  use: No    Alcohol/week: 0.0 standard drinks of alcohol   Drug use: No   Sexual activity: Not on file  Other Topics Concern   Not on file  Social History Narrative   Married   No caffeine   Social Determinants of Health   Financial Resource Strain: Low Risk  (11/06/2021)   Overall Financial Resource Strain (CARDIA)    Difficulty of Paying Living Expenses: Not hard at all  Food Insecurity: No Food Insecurity (11/06/2021)   Hunger Vital Sign    Worried About Running Out of Food in the Last Year: Never true    Ran Out of Food in the Last Year: Never true  Transportation Needs: No Transportation Needs (11/06/2021)   PRAPARE - Hydrologist (Medical): No    Lack of Transportation (Non-Medical): No  Physical Activity: Insufficiently Active (11/06/2021)   Exercise Vital Sign    Days of Exercise per Week: 3 days    Minutes of Exercise per Session: 40 min  Stress: No Stress Concern Present (11/06/2021)   [place]    Feeling of Stress : Not at all  Social Connections: Not on file    Tobacco Counseling Counseling given: Not Answered   Clinical Intake:  Pre-visit preparation completed: Yes  Pain : No/denies pain     Nutritional Status: BMI 25 -29 Overweight Nutritional Risks: None Diabetes: yes  How often do you need to  have someone help you when you read instructions, pamphlets, or other written materials from your doctor or pharmacy?: 1 - Never  Diabetic?no  Interpreter Needed?: No  Information entered by :: NAllen LPN   Activities of Daily Living    11/06/2021    3:33 PM  In your present state of health, do you have any difficulty performing the following activities:  Hearing? 0  Vision? 1  Comment has photophobia  Difficulty concentrating or making decisions? 0  Walking or climbing stairs? 1  Comment due POTS  Dressing or bathing? 0  Doing errands, shopping? 1  Preparing Food and eating ? N  Using the Toilet? N  In the past six months, have you accidently leaked urine? Y  Comment chronic cystitis  Do you have problems with loss of bowel control? N  Managing your Medications? N  Managing your Finances? N  Housekeeping or managing your Housekeeping? N    Patient Care Team: Tonia Ghent, MD as PCP - General (Family Medicine) Lorretta Harp, MD as PCP - Cardiology (Cardiology)  Indicate any recent Medical Services you may have received from other than [facility] providers in the past year (date may be approximate).     Assessment:   This is a routine wellness examination for [place].  Hearing/Vision screen Vision Screening - Comments:: Regular eye exams, in [place]  Dietary issues and exercise activities discussed: Current Exercise Habits: Home exercise routine, Type of exercise: walking (core training), Time (Minutes): 40, Frequency (Times/Week): 3, Weekly Exercise (Minutes/Week): 120   Goals Addressed             This Visit's Progress    Patient Stated       11/06/2021, wants to work to be healthier       Depression Screen    11/06/2021    3:33 PM 10/03/2021    8:32 AM  PHQ 2/9 Scores  PHQ - 2 Score 0 0  PHQ- 9 Score  0    Fall Risk  11/06/2021    3:32 PM 03/18/2016   12:50 PM  Fall Risk   Falls in the past year? 1 No  Comment due to brain injury   Number  falls in past yr: 1   Injury with Fall? 0   Risk for fall due to : Medication side effect   Follow up Falls evaluation completed;Education provided;Falls prevention discussed     FALL RISK PREVENTION PERTAINING TO THE HOME:  Any stairs in or around the home? No  If so, are there any without handrails? N/a Home free of loose throw rugs in walkways, pet beds, electrical cords, etc? Yes  Adequate lighting in your home to reduce risk of falls? Yes   ASSISTIVE DEVICES UTILIZED TO PREVENT FALLS:  Life alert? No  Use of a cane, walker or w/c? Yes  Grab bars in the bathroom? Yes  Shower chair or bench in shower? Yes  Elevated toilet seat or a handicapped toilet? No   TIMED UP AND GO:  Was the test performed? No .      Cognitive Function:    06/14/2019    9:04 AM 03/18/2016   12:57 PM  MMSE - Mini Mental State Exam  Orientation to time 4 3  Orientation to Place 5 5  Registration 3 3  Attention/ Calculation 5 5  Recall 2 2  Language- name 2 objects 2 2  Language- repeat 1 0  Language- follow 3 step command 3 3  Language- read & follow direction 1 1  Write a sentence 1 0  Copy design 1 1  Total score 28 25        11/06/2021    3:37 PM  6CIT Screen  What Year? 0 points  What month? 0 points  What time? 0 points  Count back from 20 0 points  Months in reverse 0 points  Repeat phrase 6 points  Total Score 6 points    Immunizations Immunization History  Administered Date(s) Administered   Influenza Whole 01/24/2002   Pneumococcal Polysaccharide-23 01/24/2002   Td 04/13/2006    TDAP status: Due, Education has been provided regarding the importance of this vaccine. Advised may receive this vaccine at local pharmacy or Health Dept. Aware to provide a copy of the vaccination record if obtained from local pharmacy or Health Dept. Verbalized acceptance and understanding.  Flu Vaccine status: Declined, Education has been provided regarding the importance of this vaccine  but patient still declined. Advised may receive this vaccine at local pharmacy or Health Dept. Aware to provide a copy of the vaccination record if obtained from local pharmacy or Health Dept. Verbalized acceptance and understanding.  Pneumococcal vaccine status: Declined,  Education has been provided regarding the importance of this vaccine but patient still declined. Advised may receive this vaccine at local pharmacy or Health Dept. Aware to provide a copy of the vaccination record if obtained from local pharmacy or Health Dept. Verbalized acceptance and understanding.   Covid-19 vaccine status: Declined, Education has been provided regarding the importance of this vaccine but patient still declined. Advised may receive this vaccine at local pharmacy or Health Dept.or vaccine clinic. Aware to provide a copy of the vaccination record if obtained from local pharmacy or Health Dept. Verbalized acceptance and understanding.  Qualifies for Shingles Vaccine? Yes   Zostavax completed No   Shingrix Completed?: No.    Education has been provided regarding the importance of this vaccine. Patient has been advised to call insurance company to determine out of  pocket expense if they have not yet received this vaccine. Advised may also receive vaccine at local pharmacy or Health Dept. Verbalized acceptance and understanding.  Screening Tests Health Maintenance  Topic Date Due   Hepatitis C Screening  Never done   MAMMOGRAM  03/11/2017   INFLUENZA VACCINE  11/11/2021   COLONOSCOPY (Pts 45-73yr Insurance coverage will need to be confirmed)  11/04/2024   HIV Screening  Completed   HPV VACCINES  Aged Out   TETANUS/TDAP  Discontinued   COVID-19 Vaccine  Discontinued   Zoster Vaccines- Shingrix  Discontinued    Health Maintenance  Health Maintenance Due  Topic Date Due   Hepatitis C Screening  Never done   MAMMOGRAM  03/11/2017    Colorectal cancer screening: Type of screening: Colonoscopy. Completed  11/05/2014. Repeat every 10 years  Mammogram status: Completed 2021. Repeat every year  Bone Density status: n/a  Lung Cancer Screening: (Low Dose CT Chest recommended if Age 58-80years, 30 pack-year currently smoking OR have quit w/in 15years.) does not qualify.   Lung Cancer Screening Referral: no  Additional Screening:  Hepatitis C Screening: does qualify;   Vision Screening: Recommended annual ophthalmology exams for early detection of glaucoma and other disorders of the eye. Is the patient up to date with their annual eye exam?  Yes  Who is the provider or what is the name of the office in which the patient attends annual [place] exams[place] a provider, would they like to be referred to a provider to establish care? No .   Dental Screening: Recommended annual dental exams for proper oral hygiene  Community Resource Referral / Chronic Care Management: CRR required this visit?  No   CCM required this visit?  No      Plan:     I have personally reviewed and noted the following in the patient's chart:   Medical and social history Use of alcohol, tobacco or illicit drugs  Current medications and supplements including opioid prescriptions. Patient is not currently taking opioid prescriptions. Functional ability and status Nutritional status Physical activity Advanced directives List of other physicians Hospitalizations, surgeries, and ER visits in previous 12 months Vitals Screenings to include cognitive, depression, and falls Referrals and appointments  In addition, I have reviewed and discussed with patient certain preventive protocols, quality metrics, and best practice recommendations. A written personalized care plan for preventive services as well as general preventive health recommendations were provided to patient.     NKellie Simmering LPN   [id number]  Nurse Notes: none  Due to this being a virtual visit, the after visit  summary with patients personalized plan was offered to patient via mail or my-chart.  Patient would like to access on my-chart

## 2021-11-18 DIAGNOSIS — G4733 Obstructive sleep apnea (adult) (pediatric): Secondary | ICD-10-CM | POA: Diagnosis not present

## 2021-12-08 ENCOUNTER — Encounter: Payer: Self-pay | Admitting: Family Medicine

## 2021-12-09 NOTE — Telephone Encounter (Signed)
Please see scanned form under media.  It was faxed on 10/07/2021.  Please refax and check with patient.  Thanks.

## 2021-12-23 ENCOUNTER — Encounter: Payer: Self-pay | Admitting: Cardiovascular Disease

## 2021-12-23 ENCOUNTER — Ambulatory Visit: Payer: Medicare Other | Attending: Cardiovascular Disease | Admitting: Cardiovascular Disease

## 2021-12-23 VITALS — BP 114/66 | HR 71 | Ht 68.0 in | Wt 196.6 lb

## 2021-12-23 DIAGNOSIS — I10 Essential (primary) hypertension: Secondary | ICD-10-CM | POA: Diagnosis not present

## 2021-12-23 DIAGNOSIS — E785 Hyperlipidemia, unspecified: Secondary | ICD-10-CM | POA: Diagnosis not present

## 2021-12-23 DIAGNOSIS — G90A Postural orthostatic tachycardia syndrome (POTS): Secondary | ICD-10-CM | POA: Diagnosis not present

## 2021-12-23 DIAGNOSIS — G4733 Obstructive sleep apnea (adult) (pediatric): Secondary | ICD-10-CM | POA: Diagnosis not present

## 2021-12-23 NOTE — Assessment & Plan Note (Signed)
History of dyslipidemia not on statin therapy with lipid profile performed 05/02/2019 revealing total cholesterol of 186, LDL 110 and HDL of 47.

## 2021-12-23 NOTE — Assessment & Plan Note (Signed)
History of mild sleep apnea on CPAP.

## 2021-12-23 NOTE — Patient Instructions (Signed)
Medication Instructions:  Your physician recommends that you continue on your current medications as directed. Please refer to the Current Medication list given to you today.  *If you need a refill on your cardiac medications before your next appointment, please call your pharmacy*   Follow-Up: At [facility], you and your health needs are our priority.  As part of our continuing mission to provide you with exceptional heart care, we have created designated Provider Care Teams.  These Care Teams include your primary Cardiologist (physician) and Advanced Practice Providers (APPs -  Physician Assistants and Nurse Practitioners) who all work together to provide you with the care you need, when you need it.  We recommend signing up for the patient portal called "MyChart".  Sign up information is provided on this After Visit Summary.  MyChart is used to connect with patients for Virtual Visits (Telemedicine).  Patients are able to view lab/test results, encounter notes, upcoming appointments, etc.  Non-urgent messages can be sent to your provider as well.   To learn more about what you can do with MyChart, go to [email or web address].    Your next appointment:   12 month(s)  The format for your next appointment:   In Person  Provider:   Quay Burow, MD

## 2021-12-23 NOTE — Assessment & Plan Note (Signed)
History of POTS being managed by Dr. Karn Pickler at [facility] on propranolol.
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
## 2021-12-25 ENCOUNTER — Encounter: Payer: Self-pay | Admitting: Cardiovascular Disease

## 2021-12-31 NOTE — Telephone Encounter (Signed)
Spoke with pt about her mother in law.

## 2022-01-08 DIAGNOSIS — M25374 Other instability, right foot: Secondary | ICD-10-CM | POA: Diagnosis not present

## 2022-01-08 DIAGNOSIS — M25571 Pain in right ankle and joints of right foot: Secondary | ICD-10-CM | POA: Diagnosis not present

## 2022-01-21 ENCOUNTER — Encounter: Payer: Self-pay | Admitting: Family Medicine

## 2022-01-21 DIAGNOSIS — S92341A Displaced fracture of fourth metatarsal bone, right foot, initial encounter for closed fracture: Secondary | ICD-10-CM | POA: Diagnosis not present

## 2022-01-21 DIAGNOSIS — M25374 Other instability, right foot: Secondary | ICD-10-CM | POA: Diagnosis not present

## 2022-01-21 MED ORDER — ONETOUCH ULTRA VI STRP: ORAL_STRIP | 3 refills | Status: DC

## 2022-02-03 DIAGNOSIS — M545 Low back pain, unspecified: Secondary | ICD-10-CM | POA: Diagnosis not present

## 2022-02-03 DIAGNOSIS — M6249 Contracture of muscle, multiple sites: Secondary | ICD-10-CM | POA: Diagnosis not present

## 2022-02-03 DIAGNOSIS — M9903 Segmental and somatic dysfunction of lumbar region: Secondary | ICD-10-CM | POA: Diagnosis not present

## 2022-02-10 DIAGNOSIS — M25561 Pain in right knee: Secondary | ICD-10-CM | POA: Diagnosis not present

## 2022-02-10 DIAGNOSIS — S1989XA Other specified injuries of other specified part of neck, initial encounter: Secondary | ICD-10-CM | POA: Diagnosis not present

## 2022-02-10 DIAGNOSIS — T1490XA Injury, unspecified, initial encounter: Secondary | ICD-10-CM | POA: Diagnosis not present

## 2022-02-10 DIAGNOSIS — S299XXA Unspecified injury of thorax, initial encounter: Secondary | ICD-10-CM | POA: Diagnosis not present

## 2022-02-10 DIAGNOSIS — S8001XA Contusion of right knee, initial encounter: Secondary | ICD-10-CM | POA: Diagnosis not present

## 2022-02-10 DIAGNOSIS — S46012A Strain of muscle(s) and tendon(s) of the rotator cuff of left shoulder, initial encounter: Secondary | ICD-10-CM | POA: Diagnosis not present

## 2022-02-10 DIAGNOSIS — S4982XA Other specified injuries of left shoulder and upper arm, initial encounter: Secondary | ICD-10-CM | POA: Diagnosis not present

## 2022-02-10 DIAGNOSIS — S0990XA Unspecified injury of head, initial encounter: Secondary | ICD-10-CM | POA: Diagnosis not present

## 2022-02-10 DIAGNOSIS — T07XXXA Unspecified multiple injuries, initial encounter: Secondary | ICD-10-CM | POA: Diagnosis not present

## 2022-02-10 DIAGNOSIS — I10 Essential (primary) hypertension: Secondary | ICD-10-CM | POA: Diagnosis not present

## 2022-02-10 DIAGNOSIS — S79812A Other specified injuries of left hip, initial encounter: Secondary | ICD-10-CM | POA: Diagnosis not present

## 2022-02-10 DIAGNOSIS — R519 Headache, unspecified: Secondary | ICD-10-CM | POA: Diagnosis not present

## 2022-02-10 DIAGNOSIS — S199XXA Unspecified injury of neck, initial encounter: Secondary | ICD-10-CM | POA: Diagnosis not present

## 2022-02-10 DIAGNOSIS — M25512 Pain in left shoulder: Secondary | ICD-10-CM | POA: Diagnosis not present

## 2022-02-14 DIAGNOSIS — S8001XA Contusion of right knee, initial encounter: Secondary | ICD-10-CM | POA: Diagnosis not present

## 2022-02-14 DIAGNOSIS — M79671 Pain in right foot: Secondary | ICD-10-CM | POA: Diagnosis not present

## 2022-02-23 ENCOUNTER — Other Ambulatory Visit: Payer: Self-pay | Admitting: Family Medicine

## 2022-02-23 DIAGNOSIS — G4733 Obstructive sleep apnea (adult) (pediatric): Secondary | ICD-10-CM | POA: Diagnosis not present

## 2022-02-24 DIAGNOSIS — M25571 Pain in right ankle and joints of right foot: Secondary | ICD-10-CM | POA: Diagnosis not present

## 2022-02-24 DIAGNOSIS — M25374 Other instability, right foot: Secondary | ICD-10-CM | POA: Diagnosis not present

## 2022-02-24 MED ORDER — VALSARTAN 80 MG PO TABS: 40.0000 mg | ORAL_TABLET | Freq: Every day | ORAL | 1 refills | Status: DC

## 2022-03-16 ENCOUNTER — Encounter: Payer: Self-pay | Admitting: Cardiovascular Disease

## 2022-03-16 MED ORDER — VALSARTAN 80 MG PO TABS: 40.0000 mg | ORAL_TABLET | Freq: Every day | ORAL | 3 refills | Status: DC

## 2022-03-23 DIAGNOSIS — M9908 Segmental and somatic dysfunction of rib cage: Secondary | ICD-10-CM | POA: Diagnosis not present

## 2022-03-23 DIAGNOSIS — M25551 Pain in right hip: Secondary | ICD-10-CM | POA: Diagnosis not present

## 2022-03-23 DIAGNOSIS — M9901 Segmental and somatic dysfunction of cervical region: Secondary | ICD-10-CM | POA: Diagnosis not present

## 2022-03-23 DIAGNOSIS — M9905 Segmental and somatic dysfunction of pelvic region: Secondary | ICD-10-CM | POA: Diagnosis not present

## 2022-03-23 DIAGNOSIS — M9906 Segmental and somatic dysfunction of lower extremity: Secondary | ICD-10-CM | POA: Diagnosis not present

## 2022-03-23 DIAGNOSIS — M9902 Segmental and somatic dysfunction of thoracic region: Secondary | ICD-10-CM | POA: Diagnosis not present

## 2022-03-23 DIAGNOSIS — M546 Pain in thoracic spine: Secondary | ICD-10-CM | POA: Diagnosis not present

## 2022-03-30 DIAGNOSIS — M79671 Pain in right foot: Secondary | ICD-10-CM | POA: Diagnosis not present

## 2022-03-30 DIAGNOSIS — M25374 Other instability, right foot: Secondary | ICD-10-CM | POA: Diagnosis not present

## 2022-03-31 MED ORDER — VALSARTAN 80 MG PO TABS: 80.0000 mg | ORAL_TABLET | Freq: Every day | ORAL | 3 refills | Status: DC

## 2022-03-31 NOTE — Addendum Note (Signed)
Addended by: Caprice Beaver T on: 03/31/2022 08:29 AM   Modules accepted: Orders

## 2022-04-04 ENCOUNTER — Encounter: Payer: Self-pay | Admitting: Cardiovascular Disease

## 2022-04-07 ENCOUNTER — Encounter: Payer: Self-pay | Admitting: Family Medicine

## 2022-04-07 MED ORDER — AMLODIPINE BESYLATE 5 MG PO TABS: 5.0000 mg | ORAL_TABLET | Freq: Every day | ORAL | 3 refills | Status: DC
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
## 2022-04-07 NOTE — Telephone Encounter (Signed)
Patient informed that amlodipine will not affect her liver. Prescription sent to her preferred pharmacy at [facility] in [place], [place]. She will continue with her BP/P diary. Will forward message to PharmD [facility] for 4 week f/u.

## 2022-04-08 NOTE — Telephone Encounter (Signed)
Left message to call back regarding follow up pharmD appointment.

## 2022-04-09 ENCOUNTER — Encounter: Payer: Self-pay | Admitting: Cardiovascular Disease

## 2022-04-16 NOTE — Telephone Encounter (Signed)
Left message for pt to call back  °

## 2022-04-20 ENCOUNTER — Encounter: Payer: Self-pay | Admitting: Family Medicine

## 2022-04-20 ENCOUNTER — Encounter: Payer: Self-pay | Admitting: Cardiovascular Disease

## 2022-04-20 ENCOUNTER — Ambulatory Visit (INDEPENDENT_AMBULATORY_CARE_PROVIDER_SITE_OTHER): Payer: Medicare Other | Admitting: Family Medicine

## 2022-04-20 VITALS — BP 118/82 | HR 73 | Temp 97.6°F | Ht 68.0 in | Wt 198.0 lb

## 2022-04-20 DIAGNOSIS — M542 Cervicalgia: Secondary | ICD-10-CM

## 2022-04-20 DIAGNOSIS — I10 Essential (primary) hypertension: Secondary | ICD-10-CM

## 2022-04-20 DIAGNOSIS — E538 Deficiency of other specified B group vitamins: Secondary | ICD-10-CM | POA: Diagnosis not present

## 2022-04-20 DIAGNOSIS — R300 Dysuria: Secondary | ICD-10-CM | POA: Diagnosis not present

## 2022-04-20 DIAGNOSIS — E559 Vitamin D deficiency, unspecified: Secondary | ICD-10-CM

## 2022-04-20 DIAGNOSIS — K760 Fatty (change of) liver, not elsewhere classified: Secondary | ICD-10-CM | POA: Diagnosis not present

## 2022-04-20 DIAGNOSIS — N3010 Interstitial cystitis (chronic) without hematuria: Secondary | ICD-10-CM | POA: Diagnosis not present

## 2022-04-20 LAB — CBC WITH DIFFERENTIAL/PLATELET
Basophils Absolute: 0 10*3/uL (ref 0.0–0.1)
Basophils Relative: 0.8 % (ref 0.0–3.0)
Eosinophils Absolute: 0.1 10*3/uL (ref 0.0–0.7)
Eosinophils Relative: 2.2 % (ref 0.0–5.0)
HCT: 43.9 % (ref 36.0–46.0)
Hemoglobin: 14.7 g/dL (ref 12.0–15.0)
Lymphocytes Relative: 34.7 % (ref 12.0–46.0)
Lymphs Abs: 1.6 10*3/uL (ref 0.7–4.0)
MCHC: 33.6 g/dL (ref 30.0–36.0)
MCV: 90.3 fl (ref 78.0–100.0)
Monocytes Absolute: 0.3 10*3/uL (ref 0.1–1.0)
Monocytes Relative: 6.3 % (ref 3.0–12.0)
Neutro Abs: 2.6 10*3/uL (ref 1.4–7.7)
Neutrophils Relative %: 56 % (ref 43.0–77.0)
Platelets: 264 10*3/uL (ref 150.0–400.0)
RBC: 4.86 Mil/uL (ref 3.87–5.11)
RDW: 13.1 % (ref 11.5–15.5)
WBC: 4.7 10*3/uL (ref 4.0–10.5)

## 2022-04-20 LAB — COMPREHENSIVE METABOLIC PANEL
ALT: 40 U/L — ABNORMAL HIGH (ref 0–35)
AST: 32 U/L (ref 0–37)
Albumin: 4.6 g/dL (ref 3.5–5.2)
Alkaline Phosphatase: 44 U/L (ref 39–117)
BUN: 13 mg/dL (ref 6–23)
CO2: 26 mEq/L (ref 19–32)
Calcium: 9.9 mg/dL (ref 8.4–10.5)
Chloride: 102 mEq/L (ref 96–112)
Creatinine, Ser: 0.63 mg/dL (ref 0.40–1.20)
GFR: 97.63 mL/min (ref >60.00)
Glucose, Bld: 140 mg/dL — ABNORMAL HIGH (ref 70–99)
Potassium: 4.3 mEq/L (ref 3.5–5.1)
Sodium: 137 mEq/L (ref 135–145)
Total Bilirubin: 1 mg/dL (ref 0.2–1.2)
Total Protein: 7.5 g/dL (ref 6.0–8.3)

## 2022-04-20 LAB — VITAMIN B12: Vitamin B-12: 432 pg/mL (ref 211–911)

## 2022-04-20 LAB — VITAMIN D 25 HYDROXY (VIT D DEFICIENCY, FRACTURES): VITD: 40.92 ng/mL (ref 30.00–100.00)

## 2022-04-20 NOTE — Patient Instructions (Signed)
Hold valsartan if SBP is below 140.  Go to the lab on the way out.   If you have mychart we'll likely use that to update you.    Take care.  Glad to see you. Let me know if you don't get a call about seeing Dr. Christella Noa.  I put in the referral.  You can go ahead and call in the meantime.
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
## 2022-04-21 DIAGNOSIS — L570 Actinic keratosis: Secondary | ICD-10-CM | POA: Diagnosis not present

## 2022-04-21 DIAGNOSIS — Z85828 Personal history of other malignant neoplasm of skin: Secondary | ICD-10-CM | POA: Diagnosis not present

## 2022-04-21 DIAGNOSIS — L821 Other seborrheic keratosis: Secondary | ICD-10-CM | POA: Diagnosis not present

## 2022-04-21 DIAGNOSIS — N644 Mastodynia: Secondary | ICD-10-CM | POA: Diagnosis not present

## 2022-04-22 DIAGNOSIS — M542 Cervicalgia: Secondary | ICD-10-CM | POA: Insufficient documentation

## 2022-04-22 NOTE — Assessment & Plan Note (Signed)
See notes on labs. 

## 2022-04-22 NOTE — Assessment & Plan Note (Signed)
Refer back to Dr. Christella Noa.  See after visit summary.  She can go ahead and call for a visit in the meantime.  I think that makes sense given her most recent imaging.  See scanned reports.  At this point still okay for outpatient follow-up.

## 2022-04-22 NOTE — Assessment & Plan Note (Signed)
She held valsartan the last 2 days due to lower BP.  Still on amlodipine and propranolol.  Some BLE edema on amlodipine.   See notes on labs. Hold valsartan if SBP is below 140.

## 2022-04-23 ENCOUNTER — Encounter: Payer: Self-pay | Admitting: *Deleted

## 2022-04-24 ENCOUNTER — Telehealth: Payer: Medicare Other | Admitting: Physician Assistant

## 2022-04-24 ENCOUNTER — Encounter: Payer: Self-pay | Admitting: Physician Assistant

## 2022-04-24 DIAGNOSIS — J0110 Acute frontal sinusitis, unspecified: Secondary | ICD-10-CM

## 2022-04-24 MED ORDER — FLUTICASONE PROPIONATE 50 MCG/ACT NA SUSP: 2.0000 | Freq: Every day | NASAL | 6 refills | Status: DC

## 2022-04-24 NOTE — Patient Instructions (Signed)
[name], thank you for joining Kennieth Rad, PA-C for today's virtual visit.  While this provider is not your primary care provider (PCP), if your PCP is located in our provider database this encounter information will be shared with them immediately following your visit.   [facility] account gives you access to today's visit and all your visits, tests, and labs performed at [facility] " click here if you don't have a [facility] account or go to [email or web address]  Consent: (Patient) [name] provided verbal consent for this virtual visit at the beginning of the encounter.  Current Medications:  Current Outpatient Medications:    fluticasone (FLONASE) 50 MCG/ACT nasal spray, Place 2 sprays into both nostrils daily., Disp: 16 g, Rfl: 6   amLODipine (NORVASC) 5 MG tablet, Take 1 tablet (5 mg total) by mouth daily., Disp: 90 tablet, Rfl: 3   Barberry-Oreg Grape-Goldenseal (BERBERINE COMPLEX PO), Take 1 capsule by mouth daily., Disp: , Rfl:    cholecalciferol (VITAMIN D3) 25 MCG (1000 UNIT) tablet, Take 5,000 Units by mouth daily., Disp: , Rfl:    Cranberry 1000 MG CAPS, Take by mouth daily., Disp: , Rfl:    glucose blood (ONETOUCH ULTRA) test strip, Use as instructed to check blood sugar twice a day. Dx E11.9, Disp: 200 strip, Rfl: 3   Lancets (ONETOUCH DELICA PLUS [id number]) MISC, USE AS INSTRUCTED TO CHECK BLOOD SUGAR ONCE DAILY., Disp: 100 each, Rfl: 3   Magnesium 400 MG CAPS, Take by mouth., Disp: , Rfl:    Methylcobalamin (B12-ACTIVE PO), Take 1 tablet by mouth daily., Disp: , Rfl:    propranolol (INDERAL) 40 MG tablet, Take 1 tablet (40 mg total) by mouth 2 (two) times daily as needed., Disp: , Rfl:    valsartan (DIOVAN) 80 MG tablet, Take 1 tablet (80 mg total) by mouth daily., Disp: 90 tablet, Rfl: 3   vitamin C (ASCORBIC ACID) 250 MG tablet, Take 250 mg by mouth daily., Disp: , Rfl:    Medications ordered in this  encounter:  Meds ordered this encounter  Medications   fluticasone (FLONASE) 50 MCG/ACT nasal spray    Sig: Place 2 sprays into both nostrils daily.    Dispense:  16 g    Refill:  6    Patient has previously tolerated    Order Specific Question:   Supervising Provider    Answer:   Chase Picket [[id number]]     *If you need refills on other medications prior to your next appointment, please contact your pharmacy*  Follow-Up: Call back or seek an in-person evaluation if the symptoms worsen or if the condition fails to improve as anticipated.  [facility] [phone]  Other Instructions Sinus Infection, Adult A sinus infection, also called sinusitis, is inflammation of your sinuses. Sinuses are hollow spaces in the bones around your face. Your sinuses are located: Around your eyes. In the middle of your forehead. Behind your nose. In your cheekbones. Mucus normally drains out of your sinuses. When your nasal tissues become inflamed or swollen, mucus can become trapped or blocked. This allows bacteria, viruses, and fungi to grow, which leads to infection. Most infections of the sinuses are caused by a virus. A sinus infection can develop quickly. It can last for up to 4 weeks (acute) or for more than 12 weeks (chronic). A sinus infection often develops after a cold. What are the causes? This condition is caused by anything  that creates swelling in the sinuses or stops mucus from draining. This includes: Allergies. Asthma. Infection from bacteria or viruses. Deformities or blockages in your nose or sinuses. Abnormal growths in the nose (nasal polyps). Pollutants, such as chemicals or irritants in the air. Infection from fungi. This is rare. What increases the risk? You are more likely to develop this condition if you: Have a weak body defense system (immune system). Do a lot of swimming or diving. Overuse nasal sprays. Smoke. What are the signs or  symptoms? The main symptoms of this condition are pain and a feeling of pressure around the affected sinuses. Other symptoms include: Stuffy nose or congestion that makes it difficult to breathe through your nose. Thick yellow or greenish drainage from your nose. Tenderness, swelling, and warmth over the affected sinuses. A cough that may get worse at night. Decreased sense of smell and taste. Extra mucus that collects in the throat or the back of the nose (postnasal drip) causing a sore throat or bad breath. Tiredness (fatigue). Fever. How is this diagnosed? This condition is diagnosed based on: Your symptoms. Your medical history. A physical exam. Tests to find out if your condition is acute or chronic. This may include: Checking your nose for nasal polyps. Viewing your sinuses using a device that has a light (endoscope). Testing for allergies or bacteria. Imaging tests, such as an MRI or CT scan. In rare cases, a bone biopsy may be done to rule out more serious types of fungal sinus disease. How is this treated? Treatment for a sinus infection depends on the cause and whether your condition is chronic or acute. If caused by a virus, your symptoms should go away on their own within 10 days. You may be given medicines to relieve symptoms. They include: Medicines that shrink swollen nasal passages (decongestants). A spray that eases inflammation of the nostrils (topical intranasal corticosteroids). Rinses that help get rid of thick mucus in your nose (nasal saline washes). Medicines that treat allergies (antihistamines). Over-the-counter pain relievers. If caused by bacteria, your health care provider may recommend waiting to see if your symptoms improve. Most bacterial infections will get better without antibiotic medicine. You may be given antibiotics if you have: A severe infection. A weak immune system. If caused by narrow nasal passages or nasal polyps, surgery may be  needed. Follow these instructions at home: Medicines Take, use, or apply over-the-counter and prescription medicines only as told by your health care provider. These may include nasal sprays. If you were prescribed an antibiotic medicine, take it as told by your health care provider. Do not stop taking the antibiotic even if you start to feel better. Hydrate and humidify  Drink enough fluid to keep your urine pale yellow. Staying hydrated will help to thin your mucus. Use a cool mist humidifier to keep the humidity level in your home above 50%. Inhale steam for 10-15 minutes, 3-4 times a day, or as told by your health care provider. You can do this in the bathroom while a hot shower is running. Limit your exposure to cool or dry air. Rest Rest as much as possible. Sleep with your head raised (elevated). Make sure you get enough sleep each night. General instructions  Apply a warm, moist washcloth to your face 3-4 times a day or as told by your health care provider. This will help with discomfort. Use nasal saline washes as often as told by your health care provider. Wash your hands often with soap and  water to reduce your exposure to germs. If soap and water are not available, use hand sanitizer. Do not smoke. Avoid being around people who are smoking (secondhand smoke). Keep all follow-up visits. This is important. Contact a health care provider if: You have a fever. Your symptoms get worse. Your symptoms do not improve within 10 days. Get help right away if: You have a severe headache. You have persistent vomiting. You have severe pain or swelling around your face or eyes. You have vision problems. You develop confusion. Your neck is stiff. You have trouble breathing. These symptoms may be an emergency. Get help right away. Call 911. Do not wait to see if the symptoms will go away. Do not drive yourself to the hospital. Summary A sinus infection is soreness and inflammation of  your sinuses. Sinuses are hollow spaces in the bones around your face. This condition is caused by nasal tissues that become inflamed or swollen. The swelling traps or blocks the flow of mucus. This allows bacteria, viruses, and fungi to grow, which leads to infection. If you were prescribed an antibiotic medicine, take it as told by your health care provider. Do not stop taking the antibiotic even if you start to feel better. Keep all follow-up visits. This is important. This information is not intended to replace advice given to you by your health care provider. Make sure you discuss any questions you have with your health care provider. Document Revised: 03/04/2021 Document Reviewed: 03/04/2021 Elsevier Patient Education  [place].    If you have been instructed to have an in-person evaluation today at a local Urgent Care facility, please use the link below. It will take you to a list of all of our available [facility], including address, phone number and hours of operation. Please do not delay care.  [facility]  If you or a family member do not have a primary care provider, use the link below to schedule a visit and establish care. When you choose a [facility] primary care physician or advanced practice provider, you gain a long-term partner in health. Find a Primary Care Provider  Learn more about [facility] in-office and virtual care options: [facility]

## 2022-04-24 NOTE — Progress Notes (Signed)
Virtual Visit Consent   [name], you are scheduled for a virtual visit with a [facility] provider today. Just as with appointments in the office, your consent must be obtained to participate. Your consent will be active for this visit and any virtual visit you may have with one of our providers in the next 365 days. If you have a MyChart account, a copy of this consent can be sent to you electronically.  As this is a virtual visit, video technology does not allow for your provider to perform a traditional examination. This may limit your provider's ability to fully assess your condition. If your provider identifies any concerns that need to be evaluated in person or the need to arrange testing (such as labs, EKG, etc.), we will make arrangements to do so. Although advances in technology are sophisticated, we cannot ensure that it will always work on either your end or our end. If the connection with a video visit is poor, the visit may have to be switched to a telephone visit. With either a video or telephone visit, we are not always able to ensure that we have a secure connection.  By engaging in this virtual visit, you consent to the provision of healthcare and authorize for your insurance to be billed (if applicable) for the services provided during this visit. Depending on your insurance coverage, you may receive a charge related to this service.  I need to obtain your verbal consent now. Are you willing to proceed with your visit today? [name] has provided verbal consent on 04/24/2022 for a virtual visit (video or telephone). Kennieth Rad, PA-C  Date: 04/24/2022 8:29 AM  Virtual Visit via Video Note   I, [place], connected with  [name]  ([id number], 1965-04-17) on 04/24/22 at  8:15 AM EST by a video-enabled telemedicine application and verified that I am speaking with the correct person using two identifiers.  Location: Patient: Virtual  Visit Location Patient: Home Provider: Virtual Visit Location Provider: Home Office   I discussed the limitations of evaluation and management by telemedicine and the availability of in person appointments. The patient expressed understanding and agreed to proceed.    History of Present Illness: [name] is a 57 y.o. who identifies as a female who was assigned female at birth, and is being seen today for possible sinus infection.  States that she woke up with an earache and sore throat on the left side on Tuesday morning, states that she uses a CPAP and thought maybe it had just "dried her out" but states that the sore throat got worse throughout the day.  States that she has been having a brownish blood-tinged nasal discharge, states that it is starting to improve.  States that she has been having fever and chills.  States that she was around 1 person with cold symptoms, but did not have a close contact.  Has not taken a home COVID test.  States that she has nonalcoholic cirrhosis and is mindful about medications that she uses.  States that she has been using a honey cough drops, Chloraseptic, tree tea oil, saline spray, Zarbee's cough medication and oregano with some relief..   Problems:  Patient Active Problem List   Diagnosis Date Noted   Neck pain 04/22/2022   Health care maintenance 07/02/2021   Paresthesia 09/24/2019   TIA (transient ischemic attack) 05/01/2019   Vertigo 06/02/2018   Fatty liver 03/12/2018  Left arm pain 01/28/2017   Dysautonomia ([place]) 01/28/2017   Post concussion syndrome 05/04/2016   Anxiety state 01/27/2016   OSA (obstructive sleep apnea) 10/19/2015   Traumatic brain injury ([place]) 06/11/2015   Advance care planning 10/03/2014   Hyperglycemia 09/05/2014   Atypical chest pain 08/06/2014   Routine general medical examination at a health care facility 03/30/2013   Lower back pain 03/16/2012   POTS (postural orthostatic tachycardia syndrome)  04/08/2011   SOB (shortness of breath) on exertion 12/25/2010   REACTION, ACUTE STRESS W/EMOTIONAL DSTURB 09/09/2006   Essential hypertension 09/09/2006   B12 deficiency 09/08/2006   Dyslipidemia 09/08/2006   SLEEP APNEA, OBSTRUCTIVE, MILD 09/08/2006   MIGRAINE HEADACHE 09/08/2006   GERD 09/08/2006   URINARY INCONTINENCE, MIXED 09/08/2006    Allergies:  Allergies  Allergen Reactions   Nitrofurantoin Rash   Advair Diskus [Fluticasone-Salmeterol]     Intolerant- palpitations.    Barbiturates     Hallucinations   Cefuroxime Axetil     REACTION: GI upset   Clarithromycin     REACTION: nausea   Codeine     Hypersensitive    Demerol [Meperidine] Nausea Only   Food     [place]   Gabapentin     Sedation/depressed mood   Oxybutynin     REACTION: fluid retention, headaches   Medications:  Current Outpatient Medications:    fluticasone (FLONASE) 50 MCG/ACT nasal spray, Place 2 sprays into both nostrils daily., Disp: 16 g, Rfl: 6   amLODipine (NORVASC) 5 MG tablet, Take 1 tablet (5 mg total) by mouth daily., Disp: 90 tablet, Rfl: 3   Barberry-Oreg Grape-Goldenseal (BERBERINE COMPLEX PO), Take 1 capsule by mouth daily., Disp: , Rfl:    cholecalciferol (VITAMIN D3) 25 MCG (1000 UNIT) tablet, Take 5,000 Units by mouth daily., Disp: , Rfl:    Cranberry 1000 MG CAPS, Take by mouth daily., Disp: , Rfl:    glucose blood (ONETOUCH ULTRA) test strip, Use as instructed to check blood sugar twice a day. Dx E11.9, Disp: 200 strip, Rfl: 3   Lancets (ONETOUCH DELICA PLUS [id number]) MISC, USE AS INSTRUCTED TO CHECK BLOOD SUGAR ONCE DAILY., Disp: 100 each, Rfl: 3   Magnesium 400 MG CAPS, Take by mouth., Disp: , Rfl:    Methylcobalamin (B12-ACTIVE PO), Take 1 tablet by mouth daily., Disp: , Rfl:    propranolol (INDERAL) 40 MG tablet, Take 1 tablet (40 mg total) by mouth 2 (two) times daily as needed., Disp: , Rfl:    valsartan (DIOVAN) 80 MG tablet, Take 1 tablet (80 mg total) by mouth daily., Disp:  90 tablet, Rfl: 3   vitamin C (ASCORBIC ACID) 250 MG tablet, Take 250 mg by mouth daily., Disp: , Rfl:   Observations/Objective: Patient is well-developed, well-nourished in no acute distress.  Resting comfortably  at home.  Head is normocephalic, atraumatic.  No labored breathing.  Speech is clear and coherent with logical content.  Patient is alert and oriented at baseline.    Assessment and Plan: 1. Acute non-recurrent frontal sinusitis - fluticasone (FLONASE) 50 MCG/ACT nasal spray; Place 2 sprays into both nostrils daily.  Dispense: 16 g; Refill: 6  Trial Flonase, continue current regimen, patient education given on supportive care.  Red flags given for prompt reevaluation.  Follow Up Instructions: I discussed the assessment and treatment plan with the patient. The patient was provided an opportunity to ask questions and all were answered. The patient agreed with the plan and demonstrated an understanding of the instructions.  A copy of instructions were sent to the patient via MyChart unless otherwise noted below.     The patient was advised to call back or seek an in-person evaluation if the symptoms worsen or if the condition fails to improve as anticipated.  Time:  I spent 12 minutes with the patient via telehealth technology discussing the above problems/concerns.    Loraine Grip Mayers, PA-C

## 2022-04-26 ENCOUNTER — Telehealth: Payer: Medicare Other | Admitting: Family Medicine

## 2022-04-26 DIAGNOSIS — J069 Acute upper respiratory infection, unspecified: Secondary | ICD-10-CM

## 2022-04-26 MED ORDER — PROMETHAZINE-DM 6.25-15 MG/5ML PO SYRP: 5.0000 mL | ORAL_SOLUTION | Freq: Four times a day (QID) | ORAL | 0 refills | Status: AC | PRN

## 2022-04-26 MED ORDER — AZITHROMYCIN 250 MG PO TABS: ORAL_TABLET | ORAL | 0 refills | Status: AC

## 2022-04-26 NOTE — Patient Instructions (Signed)
Acute Bronchitis, Adult  Acute bronchitis is sudden inflammation of the main airways (bronchi) that come off the windpipe (trachea) in the lungs. The swelling causes the airways to get smaller and make more mucus than normal. This can make it hard to breathe and can cause coughing or noisy breathing (wheezing). Acute bronchitis may last several weeks. The cough may last longer. Allergies, asthma, and exposure to smoke may make the condition worse. What are the causes? This condition can be caused by germs and by substances that irritate the lungs, including: Cold and flu viruses. The most common cause of this condition is the virus that causes the common cold. Bacteria. This is less common. Breathing in substances that irritate the lungs, including: Smoke from cigarettes and other forms of tobacco. Dust and pollen. Fumes from household cleaning products, gases, or burned fuel. Indoor or outdoor air pollution. What increases the risk? The following factors may make you more likely to develop this condition: A weak body's defense system, also called the immune system. A condition that affects your lungs and breathing, such as asthma. What are the signs or symptoms? Common symptoms of this condition include: Coughing. This may bring up clear, yellow, or green mucus from your lungs (sputum). Wheezing. Runny or stuffy nose. Having too much mucus in your lungs (chest congestion). Shortness of breath. Aches and pains, including sore throat or chest. How is this diagnosed? This condition is usually diagnosed based on: Your symptoms and medical history. A physical exam. You may also have other tests, including tests to rule out other conditions, such as pneumonia. These tests include: A test of lung function. Test of a mucus sample to look for the presence of bacteria. Tests to check the oxygen level in your blood. Blood tests. Chest X-ray. How is this treated? Most cases of acute  bronchitis clear up over time without treatment. Your health care provider may recommend: Drinking more fluids to help thin your mucus so it is easier to cough up. Taking inhaled medicine (inhaler) to improve air flow in and out of your lungs. Using a vaporizer or a humidifier. These are machines that add water to the air to help you breathe better. Taking a medicine that thins mucus and clears congestion (expectorant). Taking a medicine that prevents or stops coughing (cough suppressant). It is not common to take an antibiotic medicine for this condition. Follow these instructions at home:  Take over-the-counter and prescription medicines only as told by your health care provider. Use an inhaler, vaporizer, or humidifier as told by your health care provider. Take two teaspoons (10 mL) of honey at bedtime to lessen coughing at night. Drink enough fluid to keep your urine pale yellow. Do not use any products that contain nicotine or tobacco. These products include cigarettes, chewing tobacco, and vaping devices, such as e-cigarettes. If you need help quitting, ask your health care provider. Get plenty of rest. Return to your normal activities as told by your health care provider. Ask your health care provider what activities are safe for you. Keep all follow-up visits. This is important. How is this prevented? To lower your risk of getting this condition again: Wash your hands often with soap and water for at least 20 seconds. If soap and water are not available, use hand sanitizer. Avoid contact with people who have cold symptoms. Try not to touch your mouth, nose, or eyes with your hands. Avoid breathing in smoke or chemical fumes. Breathing smoke or chemical fumes will make   your condition worse. Get the flu shot every year. Contact a health care provider if: Your symptoms do not improve after 2 weeks. You have trouble coughing up the mucus. Your cough keeps you awake at night. You have  a fever. Get help right away if you: Cough up blood. Feel pain in your chest. Have severe shortness of breath. Faint or keep feeling like you are going to faint. Have a severe headache. Have a fever or chills that get worse. These symptoms may represent a serious problem that is an emergency. Do not wait to see if the symptoms will go away. Get medical help right away. Call your local emergency services (911 in the [place] Do not drive yourself to the hospital. Summary Acute bronchitis is inflammation of the main airways (bronchi) that come off the windpipe (trachea) in the lungs. The swelling causes the airways to get smaller and make more mucus than normal. Drinking more fluids can help thin your mucus so it is easier to cough up. Take over-the-counter and prescription medicines only as told by your health care provider. Do not use any products that contain nicotine or tobacco. These products include cigarettes, chewing tobacco, and vaping devices, such as e-cigarettes. If you need help quitting, ask your health care provider. Contact a health care provider if your symptoms do not improve after 2 weeks. This information is not intended to replace advice given to you by your health care provider. Make sure you discuss any questions you have with your health care provider. Document Revised: 07/10/2021 Document Reviewed: 07/31/2020 Elsevier Patient Education  [place]. Sinus Infection, Adult A sinus infection, also called sinusitis, is inflammation of your sinuses. Sinuses are hollow spaces in the bones around your face. Your sinuses are located: Around your eyes. In the middle of your forehead. Behind your nose. In your cheekbones. Mucus normally drains out of your sinuses. When your nasal tissues become inflamed or swollen, mucus can become trapped or blocked. This allows bacteria, viruses, and fungi to grow, which leads to infection. Most infections of the sinuses are caused by a  virus. A sinus infection can develop quickly. It can last for up to 4 weeks (acute) or for more than 12 weeks (chronic). A sinus infection often develops after a cold. What are the causes? This condition is caused by anything that creates swelling in the sinuses or stops mucus from draining. This includes: Allergies. Asthma. Infection from bacteria or viruses. Deformities or blockages in your nose or sinuses. Abnormal growths in the nose (nasal polyps). Pollutants, such as chemicals or irritants in the air. Infection from fungi. This is rare. What increases the risk? You are more likely to develop this condition if you: Have a weak body defense system (immune system). Do a lot of swimming or diving. Overuse nasal sprays. Smoke. What are the signs or symptoms? The main symptoms of this condition are pain and a feeling of pressure around the affected sinuses. Other symptoms include: Stuffy nose or congestion that makes it difficult to breathe through your nose. Thick yellow or greenish drainage from your nose. Tenderness, swelling, and warmth over the affected sinuses. A cough that may get worse at night. Decreased sense of smell and taste. Extra mucus that collects in the throat or the back of the nose (postnasal drip) causing a sore throat or bad breath. Tiredness (fatigue). Fever. How is this diagnosed? This condition is diagnosed based on: Your symptoms. Your medical history. A physical exam. Tests to find  out if your condition is acute or chronic. This may include: Checking your nose for nasal polyps. Viewing your sinuses using a device that has a light (endoscope). Testing for allergies or bacteria. Imaging tests, such as an MRI or CT scan. In rare cases, a bone biopsy may be done to rule out more serious types of fungal sinus disease. How is this treated? Treatment for a sinus infection depends on the cause and whether your condition is chronic or acute. If caused by a  virus, your symptoms should go away on their own within 10 days. You may be given medicines to relieve symptoms. They include: Medicines that shrink swollen nasal passages (decongestants). A spray that eases inflammation of the nostrils (topical intranasal corticosteroids). Rinses that help get rid of thick mucus in your nose (nasal saline washes). Medicines that treat allergies (antihistamines). Over-the-counter pain relievers. If caused by bacteria, your health care provider may recommend waiting to see if your symptoms improve. Most bacterial infections will get better without antibiotic medicine. You may be given antibiotics if you have: A severe infection. A weak immune system. If caused by narrow nasal passages or nasal polyps, surgery may be needed. Follow these instructions at home: Medicines Take, use, or apply over-the-counter and prescription medicines only as told by your health care provider. These may include nasal sprays. If you were prescribed an antibiotic medicine, take it as told by your health care provider. Do not stop taking the antibiotic even if you start to feel better. Hydrate and humidify  Drink enough fluid to keep your urine pale yellow. Staying hydrated will help to thin your mucus. Use a cool mist humidifier to keep the humidity level in your home above 50%. Inhale steam for 10-15 minutes, 3-4 times a day, or as told by your health care provider. You can do this in the bathroom while a hot shower is running. Limit your exposure to cool or dry air. Rest Rest as much as possible. Sleep with your head raised (elevated). Make sure you get enough sleep each night. General instructions  Apply a warm, moist washcloth to your face 3-4 times a day or as told by your health care provider. This will help with discomfort. Use nasal saline washes as often as told by your health care provider. Wash your hands often with soap and water to reduce your exposure to germs. If  soap and water are not available, use hand sanitizer. Do not smoke. Avoid being around people who are smoking (secondhand smoke). Keep all follow-up visits. This is important. Contact a health care provider if: You have a fever. Your symptoms get worse. Your symptoms do not improve within 10 days. Get help right away if: You have a severe headache. You have persistent vomiting. You have severe pain or swelling around your face or eyes. You have vision problems. You develop confusion. Your neck is stiff. You have trouble breathing. These symptoms may be an emergency. Get help right away. Call 911. Do not wait to see if the symptoms will go away. Do not drive yourself to the hospital. Summary A sinus infection is soreness and inflammation of your sinuses. Sinuses are hollow spaces in the bones around your face. This condition is caused by nasal tissues that become inflamed or swollen. The swelling traps or blocks the flow of mucus. This allows bacteria, viruses, and fungi to grow, which leads to infection. If you were prescribed an antibiotic medicine, take it as told by your health care provider.  Do not stop taking the antibiotic even if you start to feel better. Keep all follow-up visits. This is important. This information is not intended to replace advice given to you by your health care provider. Make sure you discuss any questions you have with your health care provider. Document Revised: 03/04/2021 Document Reviewed: 03/04/2021 Elsevier Patient Education  [place].

## 2022-04-26 NOTE — Progress Notes (Signed)
Virtual Visit Consent   [name], you are scheduled for a virtual visit with a [facility] provider today. Just as with appointments in the office, your consent must be obtained to participate. Your consent will be active for this visit and any virtual visit you may have with one of our providers in the next 365 days. If you have a MyChart account, a copy of this consent can be sent to you electronically.  As this is a virtual visit, video technology does not allow for your provider to perform a traditional examination. This may limit your provider's ability to fully assess your condition. If your provider identifies any concerns that need to be evaluated in person or the need to arrange testing (such as labs, EKG, etc.), we will make arrangements to do so. Although advances in technology are sophisticated, we cannot ensure that it will always work on either your end or our end. If the connection with a video visit is poor, the visit may have to be switched to a telephone visit. With either a video or telephone visit, we are not always able to ensure that we have a secure connection.  By engaging in this virtual visit, you consent to the provision of healthcare and authorize for your insurance to be billed (if applicable) for the services provided during this visit. Depending on your insurance coverage, you may receive a charge related to this service.  I need to obtain your verbal consent now. Are you willing to proceed with your visit today? [name] has provided verbal consent on 04/26/2022 for a virtual visit (video or telephone). Dellia Nims, FNP  Date: 04/26/2022 9:20 AM  Virtual Visit via Video Note   I, Dellia Nims, connected with  [name]  ([id number], 1966/02/03) on 04/26/22 at  9:15 AM EST by a video-enabled telemedicine application and verified that I am speaking with the correct person using two identifiers.  Location: Patient: Virtual Visit  Location Patient: Home Provider: Virtual Visit Location Provider: Home Office   I discussed the limitations of evaluation and management by telemedicine and the availability of in person appointments. The patient expressed understanding and agreed to proceed.    History of Present Illness: [name] is a 57 y.o. who identifies as a female who was assigned female at birth, and is being seen today for cough for a week with sinus congestion and pressure with drainage. Cough is keeping her up at night. Sx present for a week and no improvement. She says she had an online visit earlier this week and wanted to fight it on her own buts its getting worse. Marland Kitchen  HPI: HPI  Problems:  Patient Active Problem List   Diagnosis Date Noted   Neck pain 04/22/2022   Health care maintenance 07/02/2021   Paresthesia 09/24/2019   TIA (transient ischemic attack) 05/01/2019   Vertigo 06/02/2018   Fatty liver 03/12/2018   Left arm pain 01/28/2017   Dysautonomia ([place]) 01/28/2017   Post concussion syndrome 05/04/2016   Anxiety state 01/27/2016   OSA (obstructive sleep apnea) 10/19/2015   Traumatic brain injury ([place]) 06/11/2015   Advance care planning 10/03/2014   Hyperglycemia 09/05/2014   Atypical chest pain 08/06/2014   Routine general medical examination at a health care facility 03/30/2013   Lower back pain 03/16/2012   POTS (postural orthostatic tachycardia syndrome) 04/08/2011   SOB (shortness of breath) on exertion 12/25/2010   REACTION, ACUTE STRESS W/EMOTIONAL Bari Mantis 09/09/2006  Essential hypertension 09/09/2006   B12 deficiency 09/08/2006   Dyslipidemia 09/08/2006   SLEEP APNEA, OBSTRUCTIVE, MILD 09/08/2006   MIGRAINE HEADACHE 09/08/2006   GERD 09/08/2006   URINARY INCONTINENCE, MIXED 09/08/2006    Allergies:  Allergies  Allergen Reactions   Nitrofurantoin Rash   Advair Diskus [Fluticasone-Salmeterol]     Intolerant- palpitations.    Barbiturates     Hallucinations    Cefuroxime Axetil     REACTION: GI upset   Clarithromycin     REACTION: nausea   Codeine     Hypersensitive    Demerol [Meperidine] Nausea Only   Food     [place]   Gabapentin     Sedation/depressed mood   Oxybutynin     REACTION: fluid retention, headaches   Medications:  Current Outpatient Medications:    amLODipine (NORVASC) 5 MG tablet, Take 1 tablet (5 mg total) by mouth daily., Disp: 90 tablet, Rfl: 3   Barberry-Oreg Grape-Goldenseal (BERBERINE COMPLEX PO), Take 1 capsule by mouth daily., Disp: , Rfl:    cholecalciferol (VITAMIN D3) 25 MCG (1000 UNIT) tablet, Take 5,000 Units by mouth daily., Disp: , Rfl:    Cranberry 1000 MG CAPS, Take by mouth daily., Disp: , Rfl:    fluticasone (FLONASE) 50 MCG/ACT nasal spray, Place 2 sprays into both nostrils daily., Disp: 16 g, Rfl: 6   glucose blood (ONETOUCH ULTRA) test strip, Use as instructed to check blood sugar twice a day. Dx E11.9, Disp: 200 strip, Rfl: 3   Lancets (ONETOUCH DELICA PLUS [id number]) MISC, USE AS INSTRUCTED TO CHECK BLOOD SUGAR ONCE DAILY., Disp: 100 each, Rfl: 3   Magnesium 400 MG CAPS, Take by mouth., Disp: , Rfl:    Methylcobalamin (B12-ACTIVE PO), Take 1 tablet by mouth daily., Disp: , Rfl:    propranolol (INDERAL) 40 MG tablet, Take 1 tablet (40 mg total) by mouth 2 (two) times daily as needed., Disp: , Rfl:    valsartan (DIOVAN) 80 MG tablet, Take 1 tablet (80 mg total) by mouth daily., Disp: 90 tablet, Rfl: 3   vitamin C (ASCORBIC ACID) 250 MG tablet, Take 250 mg by mouth daily., Disp: , Rfl:   Observations/Objective: Patient is well-developed, well-nourished in no acute distress.  Resting comfortably  at home.  Head is normocephalic, atraumatic.  No labored breathing.  Speech is clear and coherent with logical content.  Patient is alert and oriented at baseline.    Assessment and Plan: 1. Upper respiratory tract infection, unspecified type  Increase fluids, humidifier at night, tylenol or ibuprofen,  urgent care if sx worsen. She can take zpack per patient but biaxin causes nausea.  Follow Up Instructions: I discussed the assessment and treatment plan with the patient. The patient was provided an opportunity to ask questions and all were answered. The patient agreed with the plan and demonstrated an understanding of the instructions.  A copy of instructions were sent to the patient via MyChart unless otherwise noted below.     The patient was advised to call back or seek an in-person evaluation if the symptoms worsen or if the condition fails to improve as anticipated.  Time:  I spent 10 minutes with the patient via telehealth technology discussing the above problems/concerns.    Dellia Nims, FNP

## 2022-04-30 DIAGNOSIS — R059 Cough, unspecified: Secondary | ICD-10-CM | POA: Diagnosis not present

## 2022-04-30 DIAGNOSIS — J209 Acute bronchitis, unspecified: Secondary | ICD-10-CM | POA: Diagnosis not present

## 2022-05-01 NOTE — Telephone Encounter (Signed)
Pt scheduled appointment with PharmD. See additional mychart encounter for more details.

## 2022-05-06 ENCOUNTER — Encounter: Payer: Self-pay | Admitting: Cardiovascular Disease

## 2022-05-18 ENCOUNTER — Ambulatory Visit: Payer: Medicare Other

## 2022-05-18 ENCOUNTER — Encounter: Payer: Self-pay | Admitting: Cardiovascular Disease

## 2022-05-25 DIAGNOSIS — M542 Cervicalgia: Secondary | ICD-10-CM | POA: Diagnosis not present

## 2022-05-25 DIAGNOSIS — M5416 Radiculopathy, lumbar region: Secondary | ICD-10-CM | POA: Diagnosis not present

## 2022-05-28 DIAGNOSIS — M545 Low back pain, unspecified: Secondary | ICD-10-CM | POA: Diagnosis not present

## 2022-05-28 DIAGNOSIS — M533 Sacrococcygeal disorders, not elsewhere classified: Secondary | ICD-10-CM | POA: Diagnosis not present

## 2022-05-29 ENCOUNTER — Ambulatory Visit: Payer: Medicare Other

## 2022-06-12 DIAGNOSIS — G4730 Sleep apnea, unspecified: Secondary | ICD-10-CM | POA: Diagnosis not present

## 2022-06-15 DIAGNOSIS — G4733 Obstructive sleep apnea (adult) (pediatric): Secondary | ICD-10-CM | POA: Diagnosis not present

## 2022-06-29 DIAGNOSIS — M9906 Segmental and somatic dysfunction of lower extremity: Secondary | ICD-10-CM | POA: Diagnosis not present

## 2022-06-29 DIAGNOSIS — M9902 Segmental and somatic dysfunction of thoracic region: Secondary | ICD-10-CM | POA: Diagnosis not present

## 2022-06-29 DIAGNOSIS — M25561 Pain in right knee: Secondary | ICD-10-CM | POA: Diagnosis not present

## 2022-06-29 DIAGNOSIS — G43009 Migraine without aura, not intractable, without status migrainosus: Secondary | ICD-10-CM | POA: Diagnosis not present

## 2022-06-29 DIAGNOSIS — M9908 Segmental and somatic dysfunction of rib cage: Secondary | ICD-10-CM | POA: Diagnosis not present

## 2022-06-29 DIAGNOSIS — M25511 Pain in right shoulder: Secondary | ICD-10-CM | POA: Diagnosis not present

## 2022-06-29 DIAGNOSIS — M9907 Segmental and somatic dysfunction of upper extremity: Secondary | ICD-10-CM | POA: Diagnosis not present

## 2022-06-29 DIAGNOSIS — M9901 Segmental and somatic dysfunction of cervical region: Secondary | ICD-10-CM | POA: Diagnosis not present

## 2022-06-30 ENCOUNTER — Ambulatory Visit: Payer: Medicare Other | Attending: Cardiovascular Disease | Admitting: Pharmacist

## 2022-06-30 ENCOUNTER — Encounter: Payer: Self-pay | Admitting: Pharmacist

## 2022-06-30 VITALS — BP 116/77 | HR 76

## 2022-06-30 DIAGNOSIS — K7581 Nonalcoholic steatohepatitis (NASH): Secondary | ICD-10-CM | POA: Insufficient documentation

## 2022-06-30 DIAGNOSIS — R110 Nausea: Secondary | ICD-10-CM | POA: Diagnosis not present

## 2022-06-30 DIAGNOSIS — R748 Abnormal levels of other serum enzymes: Secondary | ICD-10-CM | POA: Diagnosis not present

## 2022-06-30 DIAGNOSIS — H524 Presbyopia: Secondary | ICD-10-CM | POA: Insufficient documentation

## 2022-06-30 DIAGNOSIS — E119 Type 2 diabetes mellitus without complications: Secondary | ICD-10-CM | POA: Insufficient documentation

## 2022-06-30 DIAGNOSIS — I10 Essential (primary) hypertension: Secondary | ICD-10-CM

## 2022-06-30 DIAGNOSIS — R1011 Right upper quadrant pain: Secondary | ICD-10-CM | POA: Diagnosis not present

## 2022-06-30 DIAGNOSIS — Z6830 Body mass index (BMI) 30.0-30.9, adult: Secondary | ICD-10-CM | POA: Insufficient documentation

## 2022-06-30 DIAGNOSIS — K760 Fatty (change of) liver, not elsewhere classified: Secondary | ICD-10-CM | POA: Diagnosis not present

## 2022-06-30 MED ORDER — AMLODIPINE BESYLATE 2.5 MG PO TABS: 2.5000 mg | ORAL_TABLET | Freq: Every day | ORAL | 1 refills | Status: DC

## 2022-06-30 NOTE — Patient Instructions (Addendum)
It was nice meeting you today  We would like your blood pressure to stay less than 130/80  Please continue your amlodipine 2.5mg  daily and your propranolol as needed  Continue healthy eating and trying to be physically active especially as you travel  Please let [place] know if you have any questions  Karren Cobble, PharmD, [place], [place], [place], [place], [place], [address] Phone: [phone], Fax: [phone]

## 2022-06-30 NOTE — Progress Notes (Unsigned)
Patient ID: Karyzma Muehl                 DOB: 07-06-1965                      MRN: [id number]     HPI: [name] is a 57 y.o. female referred by Dr. Gwenlyn Found to HTN clinic. PMH is significant for POTS, HTN, TIA, OSA and hyperglycemia.  Patient presents today with BP log. Has discontinued valsartan and tapered her amlodipine down to 2.5mg  daily.  Home blood pressures typically in 120s/80s. Had two incidents today of dizziness and felt like she was going to fall. This is not unusual for her since her brain injury. Believes it is due to traveling to [place] this morning in the dark and going in and out of medical offices. Wears sunglasses prescribed by a neuro optometrist.  [name] between [place], [place], and [place] either by car or plane so occasionally struggles to eat healthy.  Current HTN meds:  Amlodipine 2.5mg  daily Propranolol ER 80mg  (for POTS)  Previously tried: valsartan 80mg  daily  BP goal: <130/80  Wt Readings from Last 3 Encounters:  04/20/22 198 lb (89.8 kg)  12/23/21 196 lb 9.6 oz (89.2 kg)  11/06/21 185 lb (83.9 kg)   BP Readings from Last 3 Encounters:  04/20/22 118/82  12/23/21 114/66  10/03/21 124/62   Pulse Readings from Last 3 Encounters:  04/20/22 73  12/23/21 71  10/03/21 75    Renal function: CrCl cannot be calculated (Patient's most recent lab result is older than the maximum 21 days allowed.).  Past Medical History:  Diagnosis Date   B12 deficiency    Cirrhosis of liver not due to alcohol ([place])    tx at [facility] hospital   Compression fracture of cervical vertebra ([place])    C5-6   Diabetes mellitus    DIET CONTROLLED   Diverticulitis    04/2008   Dysautonomia ([place])    Dysrhythmia    POTS (postural orthostatic tachycardia syndrome)   Fatty liver    GERD (gastroesophageal reflux disease)    Hyperlipidemia    Hypertension    Hyperthyroidism    f/u testing neg   Kidney cyst, acquired    benign "lesion"   Mononucleosis 2005    NASH (nonalcoholic steatohepatitis)    Neuromuscular disorder ([place])    DR LOVE HAD 1 EPISODE 2010 INVOL MOVEMENT  LT ARM    OSA (obstructive sleep apnea)    Pernicious anemia    Pneumonia    HX PNEUMONIA    Pneumonia    PONV (postoperative nausea and vomiting)    1994 HYST  WOKE UP     POTS (postural orthostatic tachycardia syndrome)    RLS (restless legs syndrome)    Seizures ([facility])    Sleep apnea    Steatosis of liver    Thyroid nodule    TIA (transient ischemic attack) 04/2019   [facility]   Treadmill stress test negative for angina pectoris 01/2000    Current Outpatient Medications on File Prior to Visit  Medication Sig Dispense Refill   albuterol (VENTOLIN HFA) 108 (90 Base) MCG/ACT inhaler Inhale into the lungs.     ibuprofen (ADVIL) 800 MG tablet Take 800 mg by mouth 3 (three) times daily.     propranolol ER (INDERAL LA) 60 MG 24 hr capsule Take 1 capsule by mouth daily.     amLODipine (NORVASC) 5 MG tablet Take 1  tablet (5 mg total) by mouth daily. 90 tablet 3   Barberry-Oreg Grape-Goldenseal (BERBERINE COMPLEX PO) Take 1 capsule by mouth daily.     cholecalciferol (VITAMIN D3) 25 MCG (1000 UNIT) tablet Take 5,000 Units by mouth daily.     Cranberry 1000 MG CAPS Take by mouth daily.     glucose blood (ONETOUCH ULTRA) test strip Use as instructed to check blood sugar twice a day. Dx E11.9 200 strip 3   Lancets (ONETOUCH DELICA PLUS [id number]) MISC USE AS INSTRUCTED TO CHECK BLOOD SUGAR ONCE DAILY. 100 each 3   Magnesium 400 MG CAPS Take by mouth.     Methylcobalamin (B12-ACTIVE PO) Take 1 tablet by mouth daily.     valsartan (DIOVAN) 80 MG tablet Take 1 tablet (80 mg total) by mouth daily. 90 tablet 3   vitamin C (ASCORBIC ACID) 250 MG tablet Take 250 mg by mouth daily.     No current facility-administered medications on file prior to visit.    Allergies  Allergen Reactions   Nitrofurantoin Rash   Advair Diskus [Fluticasone-Salmeterol]     Intolerant-  palpitations.    Barbiturates     Hallucinations   Cefuroxime Axetil     REACTION: GI upset   Clarithromycin     REACTION: nausea   Codeine     Hypersensitive    Demerol [Meperidine] Nausea Only   Food     [place]   Gabapentin     Sedation/depressed mood   Oxybutynin     REACTION: fluid retention, headaches     Assessment/Plan:  1. Hypertension -  Patient BP in room 116/77 which is at goal of <130/80. Patient has been cutting her 5mg  amlodipine tablets in half. Requests Rx of 2.5mg  tablets. Recommended focusing on healthy eating and trying to be physically active despite her long travel trips. Patient voiced understanding.  Recheck as needed.  Continue amlodipine 2.5mg  daily Recheck as needed  Karren Cobble, PharmD, [place], [place], [place], [place], [place], [address] Phone: [phone], Fax: [phone]

## 2022-07-02 ENCOUNTER — Other Ambulatory Visit: Payer: Self-pay | Admitting: Family Medicine

## 2022-07-02 DIAGNOSIS — Z1231 Encounter for screening mammogram for malignant neoplasm of breast: Secondary | ICD-10-CM

## 2022-07-13 ENCOUNTER — Encounter: Payer: Self-pay | Admitting: Family Medicine

## 2022-07-13 ENCOUNTER — Telehealth: Payer: Medicare Other | Admitting: Family Medicine

## 2022-07-13 DIAGNOSIS — R1032 Left lower quadrant pain: Secondary | ICD-10-CM | POA: Diagnosis not present

## 2022-07-13 NOTE — Progress Notes (Signed)
Virtual Visit Consent   [name], you are scheduled for a virtual visit with a [facility] provider today. Just as with appointments in the office, your consent must be obtained to participate. Your consent will be active for this visit and any virtual visit you may have with one of our providers in the next 365 days. If you have a MyChart account, a copy of this consent can be sent to you electronically.  As this is a virtual visit, video technology does not allow for your provider to perform a traditional examination. This may limit your provider's ability to fully assess your condition. If your provider identifies any concerns that need to be evaluated in person or the need to arrange testing (such as labs, EKG, etc.), we will make arrangements to do so. Although advances in technology are sophisticated, we cannot ensure that it will always work on either your end or our end. If the connection with a video visit is poor, the visit may have to be switched to a telephone visit. With either a video or telephone visit, we are not always able to ensure that we have a secure connection.  By engaging in this virtual visit, you consent to the provision of healthcare and authorize for your insurance to be billed (if applicable) for the services provided during this visit. Depending on your insurance coverage, you may receive a charge related to this service.  I need to obtain your verbal consent now. Are you willing to proceed with your visit today? [name] has provided verbal consent on 07/13/2022 for a virtual visit (video or telephone). Perlie Mayo, NP  Date: 07/13/2022 10:11 AM  Virtual Visit via Video Note   I, Perlie Mayo, connected with  [name]  ([id number], [birth year]) on 07/13/22 at 10:00 AM EDT by a video-enabled telemedicine application and verified that I am speaking with the correct person using two identifiers.  Location: Patient: Virtual  Visit Location Patient: Home Provider: Virtual Visit Location Provider: Home Office   I discussed the limitations of evaluation and management by telemedicine and the availability of in person appointments. The patient expressed understanding and agreed to proceed.    History of Present Illness: [name] is a 57 y.o. who identifies as a female who was assigned female at birth, and is being seen today for abdominal pain.  Felt like she might black out and nausea last night. Pain in LLQ that is radiating to the Right side Stool was soft, but painful BM 3 times.  Around 5 am improved then worsened again at 7 am Pain level was 8-9/10 over night, but now is at a 4-5/10.  Liquid diet to see how today goes. History 14 years ago- had a large infection in colon and it was found in colon was found on colonoscopy.  Does not have constipation at this time. Is on a plant based diet. Does not eat a lot of nuts- is sensitive to them, but did eat some pistachios last week.   Denies chest pain, shortness of breath, fevers, chills, n/v (currently), blood in stool  Problems:  Patient Active Problem List   Diagnosis Date Noted   Presbyopia of both eyes 06/30/2022   NASH (nonalcoholic steatohepatitis) 06/30/2022   Diabetes mellitus type 2, diet-controlled 06/30/2022   BMI 30.0-30.9,adult 06/30/2022   Neck pain 04/22/2022   Health care maintenance 07/02/2021   Paresthesia 09/24/2019   TIA (transient ischemic attack) 05/01/2019  Vertigo 06/02/2018   Fatty liver 03/12/2018   Left arm pain 01/28/2017   Dysautonomia 01/28/2017   Post concussion syndrome 05/04/2016   Anxiety state 01/27/2016   OSA (obstructive sleep apnea) 10/19/2015   Traumatic brain injury 06/11/2015   Advance care planning 10/03/2014   Hyperglycemia 09/05/2014   Atypical chest pain 08/06/2014   Routine general medical examination at a health care facility 03/30/2013   Pseudophakia 10/26/2012   Status post laser  cataract surgery 10/26/2012   Optic disc drusen 09/30/2012   Lower back pain 03/16/2012   POTS (postural orthostatic tachycardia syndrome) 04/08/2011   SOB (shortness of breath) on exertion 12/25/2010   REACTION, ACUTE STRESS W/EMOTIONAL DSTURB 09/09/2006   Essential hypertension 09/09/2006   B12 deficiency 09/08/2006   Dyslipidemia 09/08/2006   SLEEP APNEA, OBSTRUCTIVE, MILD 09/08/2006   MIGRAINE HEADACHE 09/08/2006   GERD 09/08/2006   URINARY INCONTINENCE, MIXED 09/08/2006    Allergies:  Allergies  Allergen Reactions   Nitrofurantoin Rash   Advair Diskus [Fluticasone-Salmeterol]     Intolerant- palpitations.    Barbiturates     Hallucinations   Cefuroxime Axetil     REACTION: GI upset   Clarithromycin     REACTION: nausea   Codeine     Hypersensitive    Demerol [Meperidine] Nausea Only   Food     [place]   Gabapentin     Sedation/depressed mood   Oxybutynin     REACTION: fluid retention, headaches   Medications:  Current Outpatient Medications:    albuterol (VENTOLIN HFA) 108 (90 Base) MCG/ACT inhaler, Inhale into the lungs., Disp: , Rfl:    amLODipine (NORVASC) 2.5 MG tablet, Take 1 tablet (2.5 mg total) by mouth daily., Disp: 90 tablet, Rfl: 1   Barberry-Oreg Grape-Goldenseal (BERBERINE COMPLEX PO), Take 1 capsule by mouth daily., Disp: , Rfl:    cholecalciferol (VITAMIN D3) 25 MCG (1000 UNIT) tablet, Take 5,000 Units by mouth daily., Disp: , Rfl:    Cranberry 1000 MG CAPS, Take by mouth daily., Disp: , Rfl:    glucose blood (ONETOUCH ULTRA) test strip, Use as instructed to check blood sugar twice a day. Dx E11.9, Disp: 200 strip, Rfl: 3   ibuprofen (ADVIL) 800 MG tablet, Take 800 mg by mouth 3 (three) times daily., Disp: , Rfl:    Lancets (ONETOUCH DELICA PLUS Q000111Q) MISC, USE AS INSTRUCTED TO CHECK BLOOD SUGAR ONCE DAILY., Disp: 100 each, Rfl: 3   Magnesium 400 MG CAPS, Take by mouth., Disp: , Rfl:    Methylcobalamin (B12-ACTIVE PO), Take 1 tablet by mouth  daily., Disp: , Rfl:    propranolol ER (INDERAL LA) 60 MG 24 hr capsule, Take 1 capsule by mouth daily., Disp: , Rfl:    valsartan (DIOVAN) 80 MG tablet, Take 1 tablet (80 mg total) by mouth daily., Disp: 90 tablet, Rfl: 3   vitamin C (ASCORBIC ACID) 250 MG tablet, Take 250 mg by mouth daily., Disp: , Rfl:   Observations/Objective: Patient is well-developed, well-nourished in no acute distress.  Resting comfortably  at home.  Head is normocephalic, atraumatic.  No labored breathing.  Speech is clear and coherent with logical content.  Patient is alert and oriented at baseline.    Assessment and Plan:   1. Left lower quadrant abdominal pain  She is advised to go be seen in person given history and risk of infection starting.   Reports that most symptoms have improved, would like to try a fluid/liquid diet today before going to be seen  in person.  Patient acknowledged agreement and understanding of the plan.  But wants to wait a bit before going in.  Reviewed side effects, risks and benefits of medication.     Past Medical, Surgical, Social History, Allergies, and Medications have been Reviewed.    Follow Up Instructions: I discussed the assessment and treatment plan with the patient. The patient was provided an opportunity to ask questions and all were answered. The patient agreed with the plan and demonstrated an understanding of the instructions.  A copy of instructions were sent to the patient via MyChart unless otherwise noted below.    The patient was advised to call back or seek an in-person evaluation if the symptoms worsen or if the condition fails to improve as anticipated.  Time:  I spent 10 minutes with the patient via telehealth technology discussing the above problems/concerns.    Perlie Mayo, NP

## 2022-07-13 NOTE — Patient Instructions (Addendum)
  [name], thank you for joining Perlie Mayo, NP for today's virtual visit.  While this provider is not your primary care provider (PCP), if your PCP is located in our provider database this encounter information will be shared with them immediately following your visit.   [facility] account gives you access to today's visit and all your visits, tests, and labs performed at [facility] " click here if you don't have a [facility] account or go to [email or web address]  Consent: (Patient) [name] provided verbal consent for this virtual visit at the beginning of the encounter.  Current Medications:  Current Outpatient Medications:    albuterol (VENTOLIN HFA) 108 (90 Base) MCG/ACT inhaler, Inhale into the lungs., Disp: , Rfl:    amLODipine (NORVASC) 2.5 MG tablet, Take 1 tablet (2.5 mg total) by mouth daily., Disp: 90 tablet, Rfl: 1   Barberry-Oreg Grape-Goldenseal (BERBERINE COMPLEX PO), Take 1 capsule by mouth daily., Disp: , Rfl:    cholecalciferol (VITAMIN D3) 25 MCG (1000 UNIT) tablet, Take 5,000 Units by mouth daily., Disp: , Rfl:    Cranberry 1000 MG CAPS, Take by mouth daily., Disp: , Rfl:    glucose blood (ONETOUCH ULTRA) test strip, Use as instructed to check blood sugar twice a day. Dx E11.9, Disp: 200 strip, Rfl: 3   ibuprofen (ADVIL) 800 MG tablet, Take 800 mg by mouth 3 (three) times daily., Disp: , Rfl:    Lancets (ONETOUCH DELICA PLUS Q000111Q) MISC, USE AS INSTRUCTED TO CHECK BLOOD SUGAR ONCE DAILY., Disp: 100 each, Rfl: 3   Magnesium 400 MG CAPS, Take by mouth., Disp: , Rfl:    Methylcobalamin (B12-ACTIVE PO), Take 1 tablet by mouth daily., Disp: , Rfl:    propranolol ER (INDERAL LA) 60 MG 24 hr capsule, Take 1 capsule by mouth daily., Disp: , Rfl:    valsartan (DIOVAN) 80 MG tablet, Take 1 tablet (80 mg total) by mouth daily., Disp: 90 tablet, Rfl: 3   vitamin C (ASCORBIC ACID) 250 MG tablet, Take 250 mg by  mouth daily., Disp: , Rfl:    Medications ordered in this encounter:  No orders of the defined types were placed in this encounter.    *If you need refills on other medications prior to your next appointment, please contact your pharmacy*  Follow-Up: Call back or seek an in-person evaluation if the symptoms worsen or if the condition fails to improve as anticipated.  [place] [facility] [phone]  Other Instructions  Please go be seen in person given your history and current symptoms    If you have been instructed to have an in-person evaluation today at a local Urgent Care facility, please use the link below. It will take you to a list of all of our available [facility], including address, phone number and hours of operation. Please do not delay care.  [facility]  If you or a family member do not have a primary care provider, use the link below to schedule a visit and establish care. When you choose a [facility] primary care physician or advanced practice provider, you gain a long-term partner in health. Find a Primary Care Provider  Learn more about [facility]'s in-office and virtual care options: [facility]

## 2022-07-16 ENCOUNTER — Other Ambulatory Visit: Payer: Self-pay | Admitting: Family Medicine

## 2022-07-16 DIAGNOSIS — Z1211 Encounter for screening for malignant neoplasm of colon: Secondary | ICD-10-CM

## 2022-07-18 ENCOUNTER — Telehealth: Payer: Medicare Other | Admitting: Nurse Practitioner

## 2022-07-18 DIAGNOSIS — K5732 Diverticulitis of large intestine without perforation or abscess without bleeding: Secondary | ICD-10-CM

## 2022-07-18 MED ORDER — METRONIDAZOLE 500 MG PO TABS: 500.0000 mg | ORAL_TABLET | Freq: Two times a day (BID) | ORAL | 0 refills | Status: DC

## 2022-07-18 NOTE — Patient Instructions (Signed)
[name], thank you for joining Bennie Pierini, FNP for today's virtual visit.  While this provider is not your primary care provider (PCP), if your PCP is located in our provider database this encounter information will be shared with them immediately following your visit.   A [facility] MyChart account gives you access to today's visit and all your visits, tests, and labs performed at [facility] " click here if you don't have a [facility] MyChart account or go to [email or web address]  Consent: (Patient) [name] provided verbal consent for this virtual visit at the beginning of the encounter.  Current Medications:  Current Outpatient Medications:    metroNIDAZOLE (FLAGYL) 500 MG tablet, Take 1 tablet (500 mg total) by mouth 2 (two) times daily., Disp: 14 tablet, Rfl: 0   albuterol (VENTOLIN HFA) 108 (90 Base) MCG/ACT inhaler, Inhale into the lungs., Disp: , Rfl:    amLODipine (NORVASC) 2.5 MG tablet, Take 1 tablet (2.5 mg total) by mouth daily., Disp: 90 tablet, Rfl: 1   Barberry-Oreg Grape-Goldenseal (BERBERINE COMPLEX PO), Take 1 capsule by mouth daily., Disp: , Rfl:    cholecalciferol (VITAMIN D3) 25 MCG (1000 UNIT) tablet, Take 5,000 Units by mouth daily., Disp: , Rfl:    Cranberry 1000 MG CAPS, Take by mouth daily., Disp: , Rfl:    glucose blood (ONETOUCH ULTRA) test strip, Use as instructed to check blood sugar twice a day. Dx E11.9, Disp: 200 strip, Rfl: 3   ibuprofen (ADVIL) 800 MG tablet, Take 800 mg by mouth 3 (three) times daily., Disp: , Rfl:    Lancets (ONETOUCH DELICA PLUS LANCET30G) MISC, USE AS INSTRUCTED TO CHECK BLOOD SUGAR ONCE DAILY., Disp: 100 each, Rfl: 3   Magnesium 400 MG CAPS, Take by mouth., Disp: , Rfl:    Methylcobalamin (B12-ACTIVE PO), Take 1 tablet by mouth daily., Disp: , Rfl:    propranolol ER (INDERAL LA) 60 MG 24 hr capsule, Take 1 capsule by mouth daily., Disp: , Rfl:    valsartan (DIOVAN) 80 MG  tablet, Take 1 tablet (80 mg total) by mouth daily., Disp: 90 tablet, Rfl: 3   vitamin C (ASCORBIC ACID) 250 MG tablet, Take 250 mg by mouth daily., Disp: , Rfl:    Medications ordered in this encounter:  Meds ordered this encounter  Medications   metroNIDAZOLE (FLAGYL) 500 MG tablet    Sig: Take 1 tablet (500 mg total) by mouth 2 (two) times daily.    Dispense:  14 tablet    Refill:  0    Order Specific Question:   Supervising Provider    Answer:   Merrilee Jansky [id number]     *If you need refills on other medications prior to your next appointment, please contact your pharmacy*  Follow-Up: Call back or seek an in-person evaluation if the symptoms worsen or if the condition fails to improve as anticipated.  [facility] [phone]  Other Instructions Watch diet Force fluids Have flagyl on hand in case needed   If you have been instructed to have an in-person evaluation today at a local Urgent Care facility, please use the link below. It will take you to a list of all of our available [facility], including address, phone number and hours of operation. Please do not delay care.  [facility]  If you or a family member do not have a primary care provider, use the link below to schedule a visit and  establish care. When you choose a [facility] primary care physician or advanced practice provider, you gain a long-term partner in health. Find a Primary Care Provider  Learn more about [facility]'s in-office and virtual care options: [facility]

## 2022-07-18 NOTE — Progress Notes (Signed)
Virtual Visit Consent   [name], you are scheduled for a virtual visit with Mary-Margaret Daphine Deutscher, FNP, a [facility] provider, today.     Just as with appointments in the office, your consent must be obtained to participate.  Your consent will be active for this visit and any virtual visit you may have with one of our providers in the next 365 days.     If you have a MyChart account, a copy of this consent can be sent to you electronically.  All virtual visits are billed to your insurance company just like a traditional visit in the office.    As this is a virtual visit, video technology does not allow for your provider to perform a traditional examination.  This may limit your provider's ability to fully assess your condition.  If your provider identifies any concerns that need to be evaluated in person or the need to arrange testing (such as labs, EKG, etc.), we will make arrangements to do so.     Although advances in technology are sophisticated, we cannot ensure that it will always work on either your end or our end.  If the connection with a video visit is poor, the visit may have to be switched to a telephone visit.  With either a video or telephone visit, we are not always able to ensure that we have a secure connection.     I need to obtain your verbal consent now.   Are you willing to proceed with your visit today? YES   [name] has provided verbal consent on 07/18/2022 for a virtual visit (video or telephone).   Mary-Margaret Daphine Deutscher, FNP   Date: 07/18/2022 12:09 PM   Virtual Visit via Video Note   I, Mary-Margaret Daphine Deutscher, connected with [name] ([id number], 06-12-1965) on 07/18/22 at 12:15 PM EDT by a video-enabled telemedicine application and verified that I am speaking with the correct person using two identifiers.  Location: Patient: Virtual Visit Location Patient: Home Provider: Virtual Visit Location Provider: Mobile   I  discussed the limitations of evaluation and management by telemedicine and the availability of in person appointments. The patient expressed understanding and agreed to proceed.    History of Present Illness: [name] is a 57 y.o. who identifies as a female who was assigned female at birth, and is being seen today for diverticulitis .  HPI: Patient has a long history of diverticuitis. She did telehealth visit with another provider. She decided to do diet and see if will clear up. She is due to fly out of town tomorrow and wanted to make sure that it would be ok to fly she really does not want antibiotics if she can go without. Her current pain 1-2/10. Mainly on left side.     Review of Systems  Constitutional:  Negative for chills and fever.  Gastrointestinal:  Positive for abdominal pain. Negative for constipation, diarrhea, nausea and vomiting.    Problems:  Patient Active Problem List   Diagnosis Date Noted   Presbyopia of both eyes 06/30/2022   NASH (nonalcoholic steatohepatitis) 06/30/2022   Diabetes mellitus type 2, diet-controlled 06/30/2022   BMI 30.0-30.9,adult 06/30/2022   Neck pain 04/22/2022   Health care maintenance 07/02/2021   Paresthesia 09/24/2019   TIA (transient ischemic attack) 05/01/2019   Vertigo 06/02/2018   Fatty liver 03/12/2018   Left arm pain 01/28/2017   Dysautonomia 01/28/2017   Post concussion syndrome 05/04/2016  Anxiety state 01/27/2016   OSA (obstructive sleep apnea) 10/19/2015   Traumatic brain injury 06/11/2015   Advance care planning 10/03/2014   Hyperglycemia 09/05/2014   Atypical chest pain 08/06/2014   Routine general medical examination at a health care facility 03/30/2013   Pseudophakia 10/26/2012   Status post laser cataract surgery 10/26/2012   Optic disc drusen 09/30/2012   Lower back pain 03/16/2012   POTS (postural orthostatic tachycardia syndrome) 04/08/2011   SOB (shortness of breath) on exertion 12/25/2010    REACTION, ACUTE STRESS W/EMOTIONAL DSTURB 09/09/2006   Essential hypertension 09/09/2006   B12 deficiency 09/08/2006   Dyslipidemia 09/08/2006   SLEEP APNEA, OBSTRUCTIVE, MILD 09/08/2006   MIGRAINE HEADACHE 09/08/2006   GERD 09/08/2006   URINARY INCONTINENCE, MIXED 09/08/2006    Allergies:  Allergies  Allergen Reactions   Nitrofurantoin Rash   Advair Diskus [Fluticasone-Salmeterol]     Intolerant- palpitations.    Barbiturates     Hallucinations   Cefuroxime Axetil     REACTION: GI upset   Clarithromycin     REACTION: nausea   Codeine     Hypersensitive    Demerol [Meperidine] Nausea Only   Food     [place]   Gabapentin     Sedation/depressed mood   Oxybutynin     REACTION: fluid retention, headaches   Medications:  Current Outpatient Medications:    albuterol (VENTOLIN HFA) 108 (90 Base) MCG/ACT inhaler, Inhale into the lungs., Disp: , Rfl:    amLODipine (NORVASC) 2.5 MG tablet, Take 1 tablet (2.5 mg total) by mouth daily., Disp: 90 tablet, Rfl: 1   Barberry-Oreg Grape-Goldenseal (BERBERINE COMPLEX PO), Take 1 capsule by mouth daily., Disp: , Rfl:    cholecalciferol (VITAMIN D3) 25 MCG (1000 UNIT) tablet, Take 5,000 Units by mouth daily., Disp: , Rfl:    Cranberry 1000 MG CAPS, Take by mouth daily., Disp: , Rfl:    glucose blood (ONETOUCH ULTRA) test strip, Use as instructed to check blood sugar twice a day. Dx E11.9, Disp: 200 strip, Rfl: 3   ibuprofen (ADVIL) 800 MG tablet, Take 800 mg by mouth 3 (three) times daily., Disp: , Rfl:    Lancets (ONETOUCH DELICA PLUS LANCET30G) MISC, USE AS INSTRUCTED TO CHECK BLOOD SUGAR ONCE DAILY., Disp: 100 each, Rfl: 3   Magnesium 400 MG CAPS, Take by mouth., Disp: , Rfl:    Methylcobalamin (B12-ACTIVE PO), Take 1 tablet by mouth daily., Disp: , Rfl:    propranolol ER (INDERAL LA) 60 MG 24 hr capsule, Take 1 capsule by mouth daily., Disp: , Rfl:    valsartan (DIOVAN) 80 MG tablet, Take 1 tablet (80 mg total) by mouth daily., Disp: 90  tablet, Rfl: 3   vitamin C (ASCORBIC ACID) 250 MG tablet, Take 250 mg by mouth daily., Disp: , Rfl:   Observations/Objective: Patient is well-developed, well-nourished in no acute distress.  Resting comfortably  at home.  Head is normocephalic, atraumatic.  No labored breathing.  Speech is clear and coherent with logical content.  Patient is alert and oriented at baseline.  Mild abdominal pain.  Assessment and Plan:  [name] in today with chief complaint of Diverticulitis   1. Diverticulitis of large intestine without perforation or abscess without bleeding Watch diet Rest Will give flagyl 500 to have on hand Should be ok to fly.  Meds ordered this encounter  Medications   metroNIDAZOLE (FLAGYL) 500 MG tablet    Sig: Take 1 tablet (500 mg total) by mouth 2 (two)  times daily.    Dispense:  14 tablet    Refill:  0    Order Specific Question:   Supervising Provider    Answer:   Merrilee JanskyLAMPTEY, PHILIP O [id number]]     Follow Up Instructions: I discussed the assessment and treatment plan with the patient. The patient was provided an opportunity to ask questions and all were answered. The patient agreed with the plan and demonstrated an understanding of the instructions.  A copy of instructions were sent to the patient via MyChart.  The patient was advised to call back or seek an in-person evaluation if the symptoms worsen or if the condition fails to improve as anticipated.  Time:  I spent 10 minutes with the patient via telehealth technology discussing the above problems/concerns.    Mary-Margaret Daphine DeutscherMartin, FNP

## 2022-07-22 NOTE — Telephone Encounter (Signed)
Please see the MyChart message from this patient.  Please either resend the referral or let me know if another referral needs to be entered.  Thanks.

## 2022-07-24 ENCOUNTER — Encounter: Payer: Self-pay | Admitting: Family Medicine

## 2022-07-29 ENCOUNTER — Ambulatory Visit: Admission: RE | Admit: 2022-07-29 | Payer: Medicare Other | Source: Ambulatory Visit

## 2022-07-29 ENCOUNTER — Other Ambulatory Visit: Payer: Self-pay | Admitting: Nurse Practitioner

## 2022-07-29 DIAGNOSIS — R1032 Left lower quadrant pain: Secondary | ICD-10-CM | POA: Diagnosis not present

## 2022-07-29 DIAGNOSIS — M25511 Pain in right shoulder: Secondary | ICD-10-CM | POA: Diagnosis not present

## 2022-07-29 DIAGNOSIS — Z86010 Personal history of colonic polyps: Secondary | ICD-10-CM | POA: Diagnosis not present

## 2022-07-29 DIAGNOSIS — S43101D Unspecified dislocation of right acromioclavicular joint, subsequent encounter: Secondary | ICD-10-CM | POA: Diagnosis not present

## 2022-07-30 ENCOUNTER — Encounter: Payer: Self-pay | Admitting: Nurse Practitioner

## 2022-07-30 ENCOUNTER — Ambulatory Visit
Admission: RE | Admit: 2022-07-30 | Discharge: 2022-07-30 | Disposition: A | Discharge status: Home / Self Care | Payer: Medicare Other | Source: Ambulatory Visit | Attending: Nurse Practitioner | Admitting: Nurse Practitioner

## 2022-07-30 DIAGNOSIS — K760 Fatty (change of) liver, not elsewhere classified: Secondary | ICD-10-CM | POA: Diagnosis not present

## 2022-07-30 DIAGNOSIS — R1032 Left lower quadrant pain: Secondary | ICD-10-CM | POA: Insufficient documentation

## 2022-07-30 DIAGNOSIS — N281 Cyst of kidney, acquired: Secondary | ICD-10-CM | POA: Diagnosis not present

## 2022-07-30 MED ORDER — IOHEXOL 9 MG/ML PO SOLN: 1000.0000 mL | ORAL | Status: AC

## 2022-07-30 MED ORDER — IOHEXOL 300 MG/ML  SOLN
100.0000 mL | Freq: Once | INTRAMUSCULAR | Status: AC | PRN
  Administered 2022-07-30: 100 mL via INTRAVENOUS

## 2022-07-30 MED ORDER — BARIUM SULFATE 2 % PO SUSP: 900.0000 mL | Freq: Once | ORAL | Status: DC

## 2022-08-24 DIAGNOSIS — M7671 Peroneal tendinitis, right leg: Secondary | ICD-10-CM | POA: Diagnosis not present

## 2022-08-24 DIAGNOSIS — M25371 Other instability, right ankle: Secondary | ICD-10-CM | POA: Diagnosis not present

## 2022-08-24 DIAGNOSIS — M25374 Other instability, right foot: Secondary | ICD-10-CM | POA: Diagnosis not present

## 2022-09-08 DIAGNOSIS — G514 Facial myokymia: Secondary | ICD-10-CM | POA: Diagnosis not present

## 2022-09-08 DIAGNOSIS — H47323 Drusen of optic disc, bilateral: Secondary | ICD-10-CM | POA: Diagnosis not present

## 2022-09-08 DIAGNOSIS — H53149 Visual discomfort, unspecified: Secondary | ICD-10-CM | POA: Diagnosis not present

## 2022-09-08 LAB — HM DIABETES EYE EXAM

## 2022-09-09 ENCOUNTER — Ambulatory Visit: Payer: Medicare Other

## 2022-09-09 DIAGNOSIS — Z86010 Personal history of colonic polyps: Secondary | ICD-10-CM | POA: Diagnosis not present

## 2022-09-09 DIAGNOSIS — K5730 Diverticulosis of large intestine without perforation or abscess without bleeding: Secondary | ICD-10-CM | POA: Diagnosis not present

## 2022-09-09 DIAGNOSIS — Z09 Encounter for follow-up examination after completed treatment for conditions other than malignant neoplasm: Secondary | ICD-10-CM | POA: Diagnosis not present

## 2022-09-09 DIAGNOSIS — Z83719 Family history of colon polyps, unspecified: Secondary | ICD-10-CM | POA: Diagnosis not present

## 2022-09-09 DIAGNOSIS — Z1211 Encounter for screening for malignant neoplasm of colon: Secondary | ICD-10-CM | POA: Diagnosis not present

## 2022-10-05 ENCOUNTER — Encounter: Payer: Self-pay | Admitting: Family Medicine

## 2022-10-05 ENCOUNTER — Ambulatory Visit (INDEPENDENT_AMBULATORY_CARE_PROVIDER_SITE_OTHER): Payer: Medicare Other | Admitting: Family Medicine

## 2022-10-05 VITALS — BP 102/70 | HR 66 | Temp 97.9°F | Ht 68.0 in | Wt 185.0 lb

## 2022-10-05 DIAGNOSIS — I10 Essential (primary) hypertension: Secondary | ICD-10-CM

## 2022-10-05 DIAGNOSIS — F0781 Postconcussional syndrome: Secondary | ICD-10-CM | POA: Diagnosis not present

## 2022-10-05 MED ORDER — AMLODIPINE BESYLATE 2.5 MG PO TABS
2.5000 mg | ORAL_TABLET | Freq: Every day | ORAL | Status: DC | PRN
Start: 2022-10-05 — End: 2024-01-20

## 2022-10-05 NOTE — Patient Instructions (Signed)
Thanks for your effort.  I'll work on your forms.   Please have the gynecology clinic send me a copy of your notes/labs.

## 2022-10-05 NOTE — Progress Notes (Unsigned)
Had eye clinic eval about 1 month ago.  Still needing shaded glasses.  Still with photophobia and phonophobia.  She has trouble concentrating when conversation is present in the background. Her balance is still affected. Her migraines are triggered by fluorescent lights.    She only drives locally and not at night or in the rain.  She still has trouble tolerating traffic on her right side.    She has not been able to stand for any length of time at work or walk significant distances at work.  She can sit but is unable to work at a typical workstation due to fluorescent lights.  She has a 20 pound weight carrying restriction.  Balance changes still affect her ability to bend and twist, squat, crawl, climb, balance, reach above head.  She can't balance on a step stool, etc.  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    She is still working on gradually trying to make progress with recovery but may have plateaued or nearly so.  She still has difficultly getting through a trip to the grocery store.    She is going to see gyn tomorrow re: mammogram, etc.    She is going to f/u re: shoulder.    Meds, vitals, and allergies reviewed.   ROS: Per HPI unless specifically indicated in ROS section   GEN: nad, alert and oriented, seen in room with lights lowered due to her photosensitivity HEENT: ncat NECK: supple w/o LA CV: rrr.  PULM: ctab, no inc wob ABD: soft, +bs EXT: no edema SKIN: Well-perfused Speech normal. She is slightly unsteady upon standing but then is able to regain her balance.

## 2022-10-06 ENCOUNTER — Ambulatory Visit: Payer: Medicare Other | Admitting: Family Medicine

## 2022-10-06 DIAGNOSIS — Z1231 Encounter for screening mammogram for malignant neoplasm of breast: Secondary | ICD-10-CM | POA: Diagnosis not present

## 2022-10-06 DIAGNOSIS — D225 Melanocytic nevi of trunk: Secondary | ICD-10-CM | POA: Diagnosis not present

## 2022-10-06 DIAGNOSIS — D2372 Other benign neoplasm of skin of left lower limb, including hip: Secondary | ICD-10-CM | POA: Diagnosis not present

## 2022-10-06 DIAGNOSIS — L820 Inflamed seborrheic keratosis: Secondary | ICD-10-CM | POA: Diagnosis not present

## 2022-10-06 DIAGNOSIS — L812 Freckles: Secondary | ICD-10-CM | POA: Diagnosis not present

## 2022-10-06 DIAGNOSIS — L821 Other seborrheic keratosis: Secondary | ICD-10-CM | POA: Diagnosis not present

## 2022-10-06 DIAGNOSIS — D1801 Hemangioma of skin and subcutaneous tissue: Secondary | ICD-10-CM | POA: Diagnosis not present

## 2022-10-06 DIAGNOSIS — D2261 Melanocytic nevi of right upper limb, including shoulder: Secondary | ICD-10-CM | POA: Diagnosis not present

## 2022-10-06 DIAGNOSIS — Z85828 Personal history of other malignant neoplasm of skin: Secondary | ICD-10-CM | POA: Diagnosis not present

## 2022-10-07 NOTE — Assessment & Plan Note (Signed)
She still has work restrictions due to her symptoms above.  See scanned forms.  See after visit summary. Would continue as is with symptom/exposure modification, i.e. avoiding bright lights, using shade glasses. 25 minutes were devoted to patient care in this encounter (this includes time spent reviewing the patient's file/history, interviewing and examining the patient, counseling/reviewing plan with patient).

## 2022-11-12 ENCOUNTER — Other Ambulatory Visit: Payer: Self-pay | Admitting: Family Medicine

## 2022-11-16 DIAGNOSIS — M9907 Segmental and somatic dysfunction of upper extremity: Secondary | ICD-10-CM | POA: Diagnosis not present

## 2022-11-16 DIAGNOSIS — M9908 Segmental and somatic dysfunction of rib cage: Secondary | ICD-10-CM | POA: Diagnosis not present

## 2022-11-16 DIAGNOSIS — M25511 Pain in right shoulder: Secondary | ICD-10-CM | POA: Diagnosis not present

## 2022-11-16 DIAGNOSIS — M9901 Segmental and somatic dysfunction of cervical region: Secondary | ICD-10-CM | POA: Diagnosis not present

## 2022-11-16 DIAGNOSIS — M9902 Segmental and somatic dysfunction of thoracic region: Secondary | ICD-10-CM | POA: Diagnosis not present

## 2022-11-16 DIAGNOSIS — M546 Pain in thoracic spine: Secondary | ICD-10-CM | POA: Diagnosis not present

## 2022-11-16 DIAGNOSIS — G43009 Migraine without aura, not intractable, without status migrainosus: Secondary | ICD-10-CM | POA: Diagnosis not present

## 2022-11-20 ENCOUNTER — Ambulatory Visit: Payer: Medicare Other

## 2022-11-20 VITALS — Ht 68.0 in | Wt 178.0 lb

## 2022-11-20 DIAGNOSIS — Z0000 Encounter for general adult medical examination without abnormal findings: Secondary | ICD-10-CM | POA: Diagnosis not present

## 2022-11-20 NOTE — Patient Instructions (Addendum)
Ms. Wahneta Bava , Thank you for taking time to come for your Medicare Wellness Visit. I appreciate your ongoing commitment to your health goals. Please review the following plan we discussed and let me know if I can assist you in the future.   Referrals/Orders/Follow-Ups/Clinician Recommendations:   This is a list of the screening recommended for you and due dates:  Health Maintenance  Topic Date Due   Complete foot exam   Never done   Eye exam for diabetics  Never done   Hepatitis C Screening  Never done   DTaP/Tdap/Td vaccine (2 - Tdap) 04/13/2016   Hemoglobin A1C  10/30/2019   Mammogram  11/15/2020   Flu Shot  11/12/2022   Yearly kidney health urinalysis for diabetes  10/05/2027*   Yearly kidney function blood test for diabetes  04/21/2023   Medicare Annual Wellness Visit  11/20/2023   Pap Smear  04/25/2024   Colon Cancer Screening  09/10/2032   HIV Screening  Completed   HPV Vaccine  Aged Out   COVID-19 Vaccine  Discontinued   Zoster (Shingles) Vaccine  Discontinued  *Topic was postponed. The date shown is not the original due date.    Advanced directives: (Copy Requested) Please bring a copy of your health care power of attorney and living will to the office to be added to your chart at your convenience.  Next Medicare Annual Wellness Visit scheduled for next year: Yes  Preventive Care 40-64 Years, Female Preventive care refers to lifestyle choices and visits with your health care provider that can promote health and wellness. What does preventive care include? A yearly physical exam. This is also called an annual well check. Dental exams once or twice a year. Routine eye exams. Ask your health care provider how often you should have your eyes checked. Personal lifestyle choices, including: Daily care of your teeth and gums. Regular physical activity. Eating a healthy diet. Avoiding tobacco and drug use. Limiting alcohol use. Practicing safe sex. Taking low-dose  aspirin daily starting at age 72. Taking vitamin and mineral supplements as recommended by your health care provider. What happens during an annual well check? The services and screenings done by your health care provider during your annual well check will depend on your age, overall health, lifestyle risk factors, and family history of disease. Counseling  Your health care provider may ask you questions about your: Alcohol use. Tobacco use. Drug use. Emotional well-being. Home and relationship well-being. Sexual activity. Eating habits. Work and work [employer]. Method of birth control. Menstrual cycle. Pregnancy history. Screening  You may have the following tests or measurements: Height, weight, and BMI. Blood pressure. Lipid and cholesterol levels. These may be checked every 5 years, or more frequently if you are over 35 years old. Skin check. Lung cancer screening. You may have this screening every year starting at age 53 if you have a 30-pack-year history of smoking and currently smoke or have quit within the past 15 years. Fecal occult blood test (FOBT) of the stool. You may have this test every year starting at age 78. Flexible sigmoidoscopy or colonoscopy. You may have a sigmoidoscopy every 5 years or a colonoscopy every 10 years starting at age 86. Hepatitis C blood test. Hepatitis B blood test. Sexually transmitted disease (STD) testing. Diabetes screening. This is done by checking your blood sugar (glucose) after you have not eaten for a while (fasting). You may have this done every 1-3 years. Mammogram. This may be done every 1-2 years.  Talk to your health care provider about when you should start having regular mammograms. This may depend on whether you have a family history of breast cancer. BRCA-related cancer screening. This may be done if you have a family history of breast, ovarian, tubal, or peritoneal cancers. Pelvic exam and Pap test. This may be done every 3  years starting at age 78. Starting at age 98, this may be done every 5 years if you have a Pap test in combination with an HPV test. Bone density scan. This is done to screen for osteoporosis. You may have this scan if you are at high risk for osteoporosis. Discuss your test results, treatment options, and if necessary, the need for more tests with your health care provider. Vaccines  Your health care provider may recommend certain vaccines, such as: Influenza vaccine. This is recommended every year. Tetanus, diphtheria, and acellular pertussis (Tdap, Td) vaccine. You may need a Td booster every 10 years. Zoster vaccine. You may need this after age 83. Pneumococcal 13-valent conjugate (PCV13) vaccine. You may need this if you have certain conditions and were not previously vaccinated. Pneumococcal polysaccharide (PPSV23) vaccine. You may need one or two doses if you smoke cigarettes or if you have certain conditions. Talk to your health care provider about which screenings and vaccines you need and how often you need them. This information is not intended to replace advice given to you by your health care provider. Make sure you discuss any questions you have with your health care provider. Document Released: 04/26/2015 Document Revised: 12/18/2015 Document Reviewed: 01/29/2015 Elsevier Interactive Patient Education  2017 [employer].    Fall Prevention in the Home Falls can cause injuries. They can happen to people of all ages. There are many things you can do to make your home safe and to help prevent falls. What can I do on the outside of my home? Regularly fix the edges of walkways and driveways and fix any cracks. Remove anything that might make you trip as you walk through a door, such as a raised step or threshold. Trim any bushes or trees on the path to your home. Use bright outdoor lighting. Clear any walking paths of anything that might make someone trip, such as rocks or  tools. Regularly check to see if handrails are loose or broken. Make sure that both sides of any steps have handrails. Any raised decks and porches should have guardrails on the edges. Have any leaves, snow, or ice cleared regularly. Use sand or salt on walking paths during winter. Clean up any spills in your garage right away. This includes oil or grease spills. What can I do in the bathroom? Use night lights. Install grab bars by the toilet and in the tub and shower. Do not use towel bars as grab bars. Use non-skid mats or decals in the tub or shower. If you need to sit down in the shower, use a plastic, non-slip stool. Keep the floor dry. Clean up any water that spills on the floor as soon as it happens. Remove soap buildup in the tub or shower regularly. Attach bath mats securely with double-sided non-slip rug tape. Do not have throw rugs and other things on the floor that can make you trip. What can I do in the bedroom? Use night lights. Make sure that you have a light by your bed that is easy to reach. Do not use any sheets or blankets that are too big for your bed. They should  not hang down onto the floor. Have a firm chair that has side arms. You can use this for support while you get dressed. Do not have throw rugs and other things on the floor that can make you trip. What can I do in the kitchen? Clean up any spills right away. Avoid walking on wet floors. Keep items that you use a lot in easy-to-reach places. If you need to reach something above you, use a strong step stool that has a grab bar. Keep electrical cords out of the way. Do not use floor polish or wax that makes floors slippery. If you must use wax, use non-skid floor wax. Do not have throw rugs and other things on the floor that can make you trip. What can I do with my stairs? Do not leave any items on the stairs. Make sure that there are handrails on both sides of the stairs and use them. Fix handrails that are  broken or loose. Make sure that handrails are as long as the stairways. Check any carpeting to make sure that it is firmly attached to the stairs. Fix any carpet that is loose or worn. Avoid having throw rugs at the top or bottom of the stairs. If you do have throw rugs, attach them to the floor with carpet tape. Make sure that you have a light switch at the top of the stairs and the bottom of the stairs. If you do not have them, ask someone to add them for you. What else can I do to help prevent falls? Wear shoes that: Do not have high heels. Have rubber bottoms. Are comfortable and fit you well. Are closed at the toe. Do not wear sandals. If you use a stepladder: Make sure that it is fully opened. Do not climb a closed stepladder. Make sure that both sides of the stepladder are locked into place. Ask someone to hold it for you, if possible. Clearly mark and make sure that you can see: Any grab bars or handrails. First and last steps. Where the edge of each step is. Use tools that help you move around (mobility aids) if they are needed. These include: Canes. Walkers. Scooters. Crutches. Turn on the lights when you go into a dark area. Replace any light bulbs as soon as they burn out. Set up your furniture so you have a clear path. Avoid moving your furniture around. If any of your floors are uneven, fix them. If there are any pets around you, be aware of where they are. Review your medicines with your doctor. Some medicines can make you feel dizzy. This can increase your chance of falling. Ask your doctor what other things that you can do to help prevent falls. This information is not intended to replace advice given to you by your health care provider. Make sure you discuss any questions you have with your health care provider. Document Released: 01/24/2009 Document Revised: 09/05/2015 Document Reviewed: 05/04/2014 Elsevier Interactive Patient Education  2017 [employer].

## 2022-11-20 NOTE — Progress Notes (Signed)
Subjective:   [name] is a 57 y.o. female who presents for Medicare Annual (Subsequent) preventive examination.  Visit Complete: Virtual  I connected with  Dortha Kern on 11/20/22 by a audio enabled telemedicine application and verified that I am speaking with the correct person using two identifiers.  Patient Location: Home  Provider Location: Home Office  I discussed the limitations of evaluation and management by telemedicine. The patient expressed understanding and agreed to proceed.  Patient Medicare AWV questionnaire was completed by the patient on ; I have confirmed that all information answered by patient is correct and no changes since this date.  Review of Systems    Vital Signs: Unable to obtain new vitals due to this being a telehealth visit.  Cardiac Risk Factors include: advanced age (>46men, >33 women);diabetes mellitus;hypertension     Objective:    Today's Vitals   11/20/22 1537  Weight: 178 lb (80.7 kg)  Height: 5\' 8"  (1.727 m)   Body mass index is 27.06 kg/m.     11/20/2022    3:51 PM 11/06/2021    3:32 PM 05/01/2019   11:50 PM 05/01/2019    8:01 PM 11/05/2014    2:32 PM 04/18/2012    1:48 PM  Advanced Directives  Does Patient Have a Medical Advance Directive? Yes Yes Yes No No Patient does not have advance directive;Patient would not like information  Type of Public librarian Power of [place];Living will Healthcare Power of [place];Living will Healthcare Power of Attorney     Does patient want to make changes to medical advance directive?   Yes (ED - Information included in AVS)     Copy of Healthcare Power of Attorney in Chart? No - copy requested No - copy requested No - copy requested     Would patient like information on creating a medical advance directive?     No - patient declined information     Current Medications (verified) Outpatient Encounter Medications as of 11/20/2022  Medication Sig   amLODipine  (NORVASC) 2.5 MG tablet Take 1 tablet (2.5 mg total) by mouth daily as needed.   Barberry-Oreg Grape-Goldenseal (BERBERINE COMPLEX PO) Take 1 capsule by mouth daily.   cholecalciferol (VITAMIN D3) 25 MCG (1000 UNIT) tablet Take 5,000 Units by mouth daily.   Cranberry 1000 MG CAPS Take by mouth daily.   glucose blood (ONETOUCH ULTRA) test strip USE AS DIRECTED TO CHECK BLOOD SUGAR ONCE DAILY   Lancets (ONETOUCH DELICA PLUS LANCET30G) MISC USE AS INSTRUCTED TO CHECK BLOOD SUGAR ONCE DAILY.   Magnesium 400 MG CAPS Take by mouth.   Methylcobalamin (B12-ACTIVE PO) Take 1 tablet by mouth daily.   vitamin C (ASCORBIC ACID) 250 MG tablet Take 250 mg by mouth daily.   No facility-administered encounter medications on file as of 11/20/2022.    Allergies (verified) Nitrofurantoin, Advair diskus [fluticasone-salmeterol], Barbiturates, Cefuroxime axetil, Clarithromycin, Codeine, Demerol [meperidine], Food, Gabapentin, Mushroom extract complex, and Oxybutynin   History: Past Medical History:  Diagnosis Date   B12 deficiency    Cirrhosis of liver not due to alcohol (HCC)    tx at [facility] hospital   Compression fracture of cervical vertebra (HCC)    C5-6   Diabetes mellitus    DIET CONTROLLED   Diverticulitis    04/2008   Dysautonomia (HCC)    Dysrhythmia    POTS (postural orthostatic tachycardia syndrome)   Fatty liver    GERD (gastroesophageal reflux disease)    Hyperlipidemia  Hypertension    Hyperthyroidism    f/u testing neg   Kidney cyst, acquired    benign "lesion"   Mononucleosis 2005   NASH (nonalcoholic steatohepatitis)    Neuromuscular disorder (HCC)    DR LOVE HAD 1 EPISODE 2010 INVOL MOVEMENT  LT ARM    OSA (obstructive sleep apnea)    Pernicious anemia    Pneumonia    HX PNEUMONIA    Pneumonia    PONV (postoperative nausea and vomiting)    1994 HYST  WOKE UP     POTS (postural orthostatic tachycardia syndrome)    RLS (restless legs syndrome)    Seizures (HCC)    Sleep  apnea    Steatosis of liver    Thyroid nodule    TIA (transient ischemic attack) 04/2019   [place] [facility]   Treadmill stress test negative for angina pectoris 01/2000   Past Surgical History:  Procedure Laterality Date   ABDOMINAL HYSTERECTOMY     ANKLE SURGERY Right    12/2020   APPENDECTOMY     BLADDER REPAIR  04/13/2001   CARDIAC CATHETERIZATION     12/2010 with Dr. Allyson Sabal, normal   CARDIOVASCULAR STRESS TEST  07/13/2006   Negative, (equivocal), increased uptake of radiotracer mid back   CARDIOVASCULAR STRESS TEST     11/2010, no ischemia   CHOLECYSTECTOMY     04/14/12   IN [facility]   COLONOSCOPY WITH PROPOFOL N/A 11/05/2014   Procedure: COLONOSCOPY WITH PROPOFOL;  Surgeon: Christena Deem, MD;  Location: [facility];  Service: Endoscopy;  Laterality: N/A;   DOPPLER ECHOCARDIOGRAPHY     small effusion, but normal o/w   ESOPHAGOGASTRODUODENOSCOPY  03/13/2000   Negative   LUMBAR LAMINECTOMY/DECOMPRESSION MICRODISCECTOMY  04/20/2012   Procedure: LUMBAR LAMINECTOMY/DECOMPRESSION MICRODISCECTOMY 1 LEVEL;  Surgeon: Carmela Hurt, MD;  Location: [facility] NEURO ORS;  Service: Neurosurgery;  Laterality: Left;  LEFT Lumbar five-Sacral one diskectomy   pubic vaginal sling     2002, 2006   Stress cardiolite  12/13/2002   Negative   TONSILLECTOMY     TONSILLECTOMY     US abdominal  08/2003 & 01/2005   Fatty liver (both times)   Family History  Problem Relation Age of Onset   Stroke Mother    Hypertension Mother    Heart disease Father        2 MI's   Bladder Cancer Father    Diabetes Other 87   Cancer Other    Hypertension Other    Hypertension Maternal Grandmother    Stroke Maternal Grandmother    Heart disease Maternal Grandfather    Hypertension Maternal Grandfather    Stroke Maternal Grandfather    Heart disease Paternal Grandfather    Colon cancer Neg Hx    Breast cancer Neg Hx    Social History   Socioeconomic History   Marital status: Married    Spouse  name: Marita [place]   Number of children: 2   Years of education: 14   Highest education level: Tax adviser degree: occupational, Scientist, product/process development, or vocational program  Occupational History    Comment: Insurance account manager  Tobacco Use   Smoking status: Never   Smokeless tobacco: Never  Vaping Use   Vaping status: Never Used  Substance and Sexual Activity   Alcohol use: No    Alcohol/week: 0.0 standard drinks of alcohol   Drug use: No   Sexual activity: Not on file  Other Topics Concern   Not on file  Social History  Narrative   Married   No caffeine   Social Determinants of Health   Financial Resource Strain: Low Risk  (11/20/2022)   Overall Financial Resource Strain (CARDIA)    Difficulty of Paying Living Expenses: Not hard at all  Food Insecurity: No Food Insecurity (11/20/2022)   Hunger Vital Sign    Worried About Running Out of Food in the Last Year: Never true    Ran Out of Food in the Last Year: Never true  Transportation Needs: No Transportation Needs (11/20/2022)   PRAPARE - Administrator, Civil Service (Medical): No    Lack of Transportation (Non-Medical): No  Physical Activity: Insufficiently Active (11/20/2022)   Exercise Vital Sign    Days of Exercise per Week: 4 days    Minutes of Exercise per Session: 30 min  Stress: No Stress Concern Present (11/20/2022)   [employer] of Occupational Health - Occupational Stress Questionnaire    Feeling of Stress : Not at all  Social Connections: Socially Integrated (11/20/2022)   Social Connection and Isolation Panel [NHANES]    Frequency of Communication with Friends and Family: More than three times a week    Frequency of Social Gatherings with Friends and Family: More than three times a week    Attends Religious Services: More than 4 times per year    Active Member of [employer] or Organizations: Yes    Attends Engineer, structural: More than 4 times per year    Marital Status: Married    Tobacco  Counseling Counseling given: Not Answered   Clinical Intake:  Pre-visit preparation completed: No  Pain : No/denies pain     BMI - recorded: 27.06 Nutritional Status: BMI 25 -29 Overweight Nutritional Risks: None Diabetes: Yes CBG done?: Yes (CBG 117 Taken by patient) CBG resulted in Enter/ Edit results?: Yes Did pt. bring in CBG monitor from home?: No  How often do you need to have someone help you when you read instructions, pamphlets, or other written materials from your doctor or pharmacy?: 3 - Sometimes (Husband Assist)  Interpreter Needed?: No  Information entered by :: Theresa Mulligan LPN   Activities of Daily Living    11/20/2022    3:49 PM  In your present state of health, do you have any difficulty performing the following activities:  Hearing? 0  Vision? 0  Difficulty concentrating or making decisions? 0  Walking or climbing stairs? 0  Dressing or bathing? 0  Doing errands, shopping? 0  Preparing Food and eating ? N  Using the Toilet? N  In the past six months, have you accidently leaked urine? Y  Comment Wears pads Followed by Urologist  Do you have problems with loss of bowel control? N  Managing your Medications? N  Managing your Finances? N  Housekeeping or managing your Housekeeping? N    Patient Care Team: Joaquim Nam, MD as PCP - General (Family Medicine) Runell Gess, MD as PCP - Cardiology (Cardiology) Crista Elliot, MD as Consulting Physician (Urology) Marcelle Overlie, MD as Consulting Physician (Obstetrics and Gynecology)  Indicate any recent Medical Services you may have received from other than [facility] providers in the past year (date may be approximate).     Assessment:   This is a routine wellness examination for [place].  Hearing/Vision screen Hearing Screening - Comments:: Denies hearing difficulties   Vision Screening - Comments:: Wears rx glasses - up to date with routine eye exams with  [facility]  Care  Dietary issues and exercise activities discussed:     Goals Addressed               This Visit's Progress     Get healthy and stay healthy. (pt-stated)         Depression Screen    11/20/2022    3:47 PM 10/05/2022    2:10 PM 11/06/2021    3:33 PM 10/03/2021    8:32 AM  PHQ 2/9 Scores  PHQ - 2 Score 0 0 0 0  PHQ- 9 Score 0 4  0    Fall Risk    11/20/2022    3:50 PM 10/05/2022    2:09 PM 11/06/2021    3:32 PM 03/18/2016   12:50 PM  Fall Risk   Falls in the past year? 1 1 1  No  Comment   due to brain injury   Number falls in past yr: 0 1 1   Injury with Fall? 0 0 0   Risk for fall due to : No Fall Risks;Impaired balance/gait No Fall Risks Medication side effect   Follow up Falls prevention discussed Falls evaluation completed Falls evaluation completed;Education provided;Falls prevention discussed     MEDICARE RISK AT HOME:  Medicare Risk at Home - 11/20/22 1557     Any stairs in or around the home? Yes    If so, are there any without handrails? No    Home free of loose throw rugs in walkways, pet beds, electrical cords, etc? Yes    Adequate lighting in your home to reduce risk of falls? Yes    Life alert? No    Use of a cane, walker or w/c? Yes    Grab bars in the bathroom? Yes    Shower chair or bench in shower? Yes    Elevated toilet seat or a handicapped toilet? No             TIMED UP AND GO:  Was the test performed?  No    Cognitive Function:    06/14/2019    9:04 AM 03/18/2016   12:57 PM  MMSE - Mini Mental State Exam  Orientation to time 4 3  Orientation to Place 5 5  Registration 3 3  Attention/ Calculation 5 5  Recall 2 2  Language- name 2 objects 2 2  Language- repeat 1 0  Language- follow 3 step command 3 3  Language- read & follow direction 1 1  Write a sentence 1 0  Copy design 1 1  Total score 28 25        11/20/2022    3:51 PM 11/06/2021    3:37 PM  6CIT Screen  What Year? 0 points 0 points  What month? 0 points 0 points   What time? 0 points 0 points  Count back from 20 0 points 0 points  Months in reverse 0 points 0 points  Repeat phrase 6 points 6 points  Total Score 6 points 6 points    Immunizations Immunization History  Administered Date(s) Administered   Influenza Whole 01/24/2002   Pneumococcal Polysaccharide-23 01/24/2002   Td 04/13/2006    TDAP status: Due, Education has been provided regarding the importance of this vaccine. Advised may receive this vaccine at local pharmacy or Health Dept. Aware to provide a copy of the vaccination record if obtained from local pharmacy or Health Dept. Verbalized acceptance and understanding.  Flu Vaccine status: Declined, Education has been provided regarding the importance of this  vaccine but patient still declined. Advised may receive this vaccine at local pharmacy or Health Dept. Aware to provide a copy of the vaccination record if obtained from local pharmacy or Health Dept. Verbalized acceptance and understanding.     Screening Tests Health Maintenance  Topic Date Due   FOOT EXAM  Never done   OPHTHALMOLOGY EXAM  Never done   Hepatitis C Screening  Never done   DTaP/Tdap/Td (2 - Tdap) 04/13/2016   HEMOGLOBIN A1C  10/30/2019   MAMMOGRAM  11/15/2020   INFLUENZA VACCINE  11/12/2022   Diabetic kidney evaluation - Urine ACR  10/05/2027 (Originally 09/18/2015)   Diabetic kidney evaluation - eGFR measurement  04/21/2023   Medicare Annual Wellness (AWV)  11/20/2023   PAP SMEAR-Modifier  04/25/2024   Colonoscopy  09/10/2032   HIV Screening  Completed   HPV VACCINES  Aged Out   COVID-19 Vaccine  Discontinued   Zoster Vaccines- Shingrix  Discontinued    Health Maintenance  Health Maintenance Due  Topic Date Due   FOOT EXAM  Never done   OPHTHALMOLOGY EXAM  Never done   Hepatitis C Screening  Never done   DTaP/Tdap/Td (2 - Tdap) 04/13/2016   HEMOGLOBIN A1C  10/30/2019   MAMMOGRAM  11/15/2020   INFLUENZA VACCINE  11/12/2022    Colorectal  cancer screening: Type of screening: Colonoscopy. Completed 09/11/22. Repeat every 10 years  Mammogram status: Completed 07/02/22. Repeat every year    Lung Cancer Screening: (Low Dose CT Chest recommended if Age 51-80 years, 20 pack-year currently smoking OR have quit w/in 15years.) does not qualify.     Additional Screening:  Hepatitis C Screening: does qualify;  Deferred  Vision Screening: Recommended annual ophthalmology exams for early detection of glaucoma and other disorders of the eye. Is the patient up to date with their annual eye exam?  Yes  Who is the provider or what is the name of the office in which the patient attends annual eye exams? [facility] If pt is not established with a provider, would they like to be referred to a provider to establish care? No .   Dental Screening: Recommended annual dental exams for proper oral hygiene  Diabetic Foot Exam: Diabetic Foot Exam: Overdue, Pt has been advised about the importance in completing this exam. Pt is scheduled for diabetic foot exam on Followed by PCP.  Community Resource Referral / Chronic Care Management:  CRR required this visit?  No   CCM required this visit?  No     Plan:     I have personally reviewed and noted the following in the patient's chart:   Medical and social history Use of alcohol, tobacco or illicit drugs  Current medications and supplements including opioid prescriptions. Patient is not currently taking opioid prescriptions. Functional ability and status Nutritional status Physical activity Advanced directives List of other physicians Hospitalizations, surgeries, and ER visits in previous 12 months Vitals Screenings to include cognitive, depression, and falls Referrals and appointments  In addition, I have reviewed and discussed with patient certain preventive protocols, quality metrics, and best practice recommendations. A written personalized care plan for preventive  services as well as general preventive health recommendations were provided to patient.     Tillie Rung, LPN   04/17/7827   After Visit Summary: (MyChart) Due to this being a telephonic visit, the after visit summary with patients personalized plan was offered to patient via MyChart   Nurse Notes: Patient due Hemoglobin A1C

## 2022-12-18 ENCOUNTER — Encounter: Payer: Self-pay | Admitting: Family Medicine

## 2022-12-25 ENCOUNTER — Other Ambulatory Visit: Payer: Self-pay | Admitting: Family Medicine

## 2022-12-25 DIAGNOSIS — R739 Hyperglycemia, unspecified: Secondary | ICD-10-CM

## 2022-12-29 DIAGNOSIS — G90A Postural orthostatic tachycardia syndrome (POTS): Secondary | ICD-10-CM | POA: Diagnosis not present

## 2022-12-29 DIAGNOSIS — I10 Essential (primary) hypertension: Secondary | ICD-10-CM | POA: Diagnosis not present

## 2022-12-30 DIAGNOSIS — M25511 Pain in right shoulder: Secondary | ICD-10-CM | POA: Diagnosis not present

## 2022-12-30 DIAGNOSIS — M9904 Segmental and somatic dysfunction of sacral region: Secondary | ICD-10-CM | POA: Diagnosis not present

## 2022-12-30 DIAGNOSIS — M9907 Segmental and somatic dysfunction of upper extremity: Secondary | ICD-10-CM | POA: Diagnosis not present

## 2022-12-30 DIAGNOSIS — M9901 Segmental and somatic dysfunction of cervical region: Secondary | ICD-10-CM | POA: Diagnosis not present

## 2022-12-30 DIAGNOSIS — G4700 Insomnia, unspecified: Secondary | ICD-10-CM | POA: Diagnosis not present

## 2022-12-30 DIAGNOSIS — Z79899 Other long term (current) drug therapy: Secondary | ICD-10-CM | POA: Diagnosis not present

## 2022-12-30 DIAGNOSIS — M79601 Pain in right arm: Secondary | ICD-10-CM | POA: Diagnosis not present

## 2022-12-30 DIAGNOSIS — M9905 Segmental and somatic dysfunction of pelvic region: Secondary | ICD-10-CM | POA: Diagnosis not present

## 2022-12-30 DIAGNOSIS — M9906 Segmental and somatic dysfunction of lower extremity: Secondary | ICD-10-CM | POA: Diagnosis not present

## 2022-12-30 DIAGNOSIS — M9902 Segmental and somatic dysfunction of thoracic region: Secondary | ICD-10-CM | POA: Diagnosis not present

## 2022-12-30 DIAGNOSIS — M9908 Segmental and somatic dysfunction of rib cage: Secondary | ICD-10-CM | POA: Diagnosis not present

## 2022-12-31 ENCOUNTER — Other Ambulatory Visit: Payer: Self-pay | Admitting: Family Medicine

## 2022-12-31 ENCOUNTER — Encounter: Payer: Self-pay | Admitting: Family Medicine

## 2022-12-31 ENCOUNTER — Other Ambulatory Visit: Payer: Medicare Other

## 2022-12-31 DIAGNOSIS — R300 Dysuria: Secondary | ICD-10-CM

## 2022-12-31 NOTE — Telephone Encounter (Signed)
Patient called office back to make sure that message was received. She was seen by urology and had abnormal urine in office yesterday. They requested urine culture. Patient is taking care of mother and is very hard to get to [employer]. Would like to know if we can order it with her labs scheduled tomorrow. Patient was being tested for chronic issues with dysuria

## 2023-01-01 ENCOUNTER — Other Ambulatory Visit (INDEPENDENT_AMBULATORY_CARE_PROVIDER_SITE_OTHER): Payer: Medicare Other

## 2023-01-01 DIAGNOSIS — R300 Dysuria: Secondary | ICD-10-CM | POA: Diagnosis not present

## 2023-01-01 DIAGNOSIS — R739 Hyperglycemia, unspecified: Secondary | ICD-10-CM | POA: Diagnosis not present

## 2023-01-01 LAB — HEMOGLOBIN A1C: Hgb A1c MFr Bld: 6.4 % (ref 4.6–6.5)

## 2023-01-02 LAB — URINE CULTURE
MICRO NUMBER:: 15495449
SPECIMEN QUALITY:: ADEQUATE

## 2023-01-02 LAB — C-PEPTIDE: C-Peptide: 3.44 ng/mL (ref 0.80–3.85)

## 2023-02-03 DIAGNOSIS — N3010 Interstitial cystitis (chronic) without hematuria: Secondary | ICD-10-CM | POA: Diagnosis not present

## 2023-02-09 ENCOUNTER — Other Ambulatory Visit: Payer: Self-pay | Admitting: Family Medicine

## 2023-03-01 DIAGNOSIS — M7742 Metatarsalgia, left foot: Secondary | ICD-10-CM | POA: Diagnosis not present

## 2023-03-01 DIAGNOSIS — M25375 Other instability, left foot: Secondary | ICD-10-CM | POA: Diagnosis not present

## 2023-03-19 NOTE — Telephone Encounter (Signed)
Care team updated and abstracted eye exam notes.

## 2023-04-26 DIAGNOSIS — R7303 Prediabetes: Secondary | ICD-10-CM | POA: Diagnosis not present

## 2023-04-26 DIAGNOSIS — E785 Hyperlipidemia, unspecified: Secondary | ICD-10-CM | POA: Diagnosis not present

## 2023-05-09 ENCOUNTER — Other Ambulatory Visit: Payer: Self-pay | Admitting: Cardiovascular Disease

## 2023-06-18 DIAGNOSIS — M9907 Segmental and somatic dysfunction of upper extremity: Secondary | ICD-10-CM | POA: Diagnosis not present

## 2023-06-18 DIAGNOSIS — M9903 Segmental and somatic dysfunction of lumbar region: Secondary | ICD-10-CM | POA: Diagnosis not present

## 2023-06-18 DIAGNOSIS — M9902 Segmental and somatic dysfunction of thoracic region: Secondary | ICD-10-CM | POA: Diagnosis not present

## 2023-06-18 DIAGNOSIS — G4700 Insomnia, unspecified: Secondary | ICD-10-CM | POA: Diagnosis not present

## 2023-06-18 DIAGNOSIS — M9901 Segmental and somatic dysfunction of cervical region: Secondary | ICD-10-CM | POA: Diagnosis not present

## 2023-06-18 DIAGNOSIS — M542 Cervicalgia: Secondary | ICD-10-CM | POA: Diagnosis not present

## 2023-06-18 DIAGNOSIS — G4733 Obstructive sleep apnea (adult) (pediatric): Secondary | ICD-10-CM | POA: Diagnosis not present

## 2023-06-18 DIAGNOSIS — M25511 Pain in right shoulder: Secondary | ICD-10-CM | POA: Diagnosis not present

## 2023-06-18 DIAGNOSIS — M9908 Segmental and somatic dysfunction of rib cage: Secondary | ICD-10-CM | POA: Diagnosis not present

## 2023-09-27 DIAGNOSIS — M9902 Segmental and somatic dysfunction of thoracic region: Secondary | ICD-10-CM | POA: Diagnosis not present

## 2023-09-27 DIAGNOSIS — M25511 Pain in right shoulder: Secondary | ICD-10-CM | POA: Diagnosis not present

## 2023-09-27 DIAGNOSIS — M9908 Segmental and somatic dysfunction of rib cage: Secondary | ICD-10-CM | POA: Diagnosis not present

## 2023-09-27 DIAGNOSIS — M542 Cervicalgia: Secondary | ICD-10-CM | POA: Diagnosis not present

## 2023-09-27 DIAGNOSIS — H5371 Glare sensitivity: Secondary | ICD-10-CM | POA: Diagnosis not present

## 2023-09-27 DIAGNOSIS — M9906 Segmental and somatic dysfunction of lower extremity: Secondary | ICD-10-CM | POA: Diagnosis not present

## 2023-09-27 DIAGNOSIS — R42 Dizziness and giddiness: Secondary | ICD-10-CM | POA: Diagnosis not present

## 2023-09-27 DIAGNOSIS — M9903 Segmental and somatic dysfunction of lumbar region: Secondary | ICD-10-CM | POA: Diagnosis not present

## 2023-09-27 DIAGNOSIS — M9905 Segmental and somatic dysfunction of pelvic region: Secondary | ICD-10-CM | POA: Diagnosis not present

## 2023-09-27 DIAGNOSIS — G43109 Migraine with aura, not intractable, without status migrainosus: Secondary | ICD-10-CM | POA: Diagnosis not present

## 2023-09-27 DIAGNOSIS — M545 Low back pain, unspecified: Secondary | ICD-10-CM | POA: Diagnosis not present

## 2023-10-05 ENCOUNTER — Telehealth: Payer: Self-pay | Admitting: Family Medicine

## 2023-10-05 NOTE — Telephone Encounter (Signed)
 Copied from CRM [id number]. Topic: General - Other >> Oct 05, 2023 12:48 PM Lavanda D wrote: Reason for CRM: Patient is calling to get long term disability paperwork filled out. Would like to know if new visit is required or if it can be done at the upcoming appt on 7/14. Paperwork needs to be in by 8/20. Please call back with answer.

## 2023-10-05 NOTE — Telephone Encounter (Signed)
 I believe this can wait for her appointment 7/14. Please advise

## 2023-10-06 NOTE — Telephone Encounter (Signed)
 Please have her bring the paperwork to the visit on 7/14.  Thanks.

## 2023-10-06 NOTE — Telephone Encounter (Signed)
 Returned call to patient to advise that she can just bring the paperwork with her to the 7/14 appointment

## 2023-10-25 ENCOUNTER — Ambulatory Visit (INDEPENDENT_AMBULATORY_CARE_PROVIDER_SITE_OTHER): Admitting: Family Medicine

## 2023-10-25 ENCOUNTER — Encounter: Payer: Self-pay | Admitting: Family Medicine

## 2023-10-25 VITALS — BP 112/62 | HR 65 | Temp 97.8°F | Ht 68.0 in | Wt 178.8 lb

## 2023-10-25 DIAGNOSIS — F0781 Postconcussional syndrome: Secondary | ICD-10-CM | POA: Diagnosis not present

## 2023-10-25 DIAGNOSIS — Z85828 Personal history of other malignant neoplasm of skin: Secondary | ICD-10-CM | POA: Diagnosis not present

## 2023-10-25 DIAGNOSIS — L821 Other seborrheic keratosis: Secondary | ICD-10-CM | POA: Diagnosis not present

## 2023-10-25 DIAGNOSIS — G4733 Obstructive sleep apnea (adult) (pediatric): Secondary | ICD-10-CM | POA: Diagnosis not present

## 2023-10-25 DIAGNOSIS — D1801 Hemangioma of skin and subcutaneous tissue: Secondary | ICD-10-CM | POA: Diagnosis not present

## 2023-10-25 DIAGNOSIS — R739 Hyperglycemia, unspecified: Secondary | ICD-10-CM

## 2023-10-25 DIAGNOSIS — K7581 Nonalcoholic steatohepatitis (NASH): Secondary | ICD-10-CM

## 2023-10-25 LAB — LIPID PANEL
Cholesterol: 222 mg/dL — ABNORMAL HIGH (ref 0–200)
HDL: 46 mg/dL (ref >39.00)
LDL Cholesterol: 144 mg/dL — ABNORMAL HIGH (ref 0–99)
NonHDL: 175.54
Total CHOL/HDL Ratio: 5
Triglycerides: 158 mg/dL — ABNORMAL HIGH (ref 0.0–149.0)
VLDL: 31.6 mg/dL (ref 0.0–40.0)

## 2023-10-25 LAB — CBC WITH DIFFERENTIAL/PLATELET
Basophils Absolute: 0 K/uL (ref 0.0–0.1)
Basophils Relative: 0.6 % (ref 0.0–3.0)
Eosinophils Absolute: 0.1 K/uL (ref 0.0–0.7)
Eosinophils Relative: 1.2 % (ref 0.0–5.0)
HCT: 44.5 % (ref 36.0–46.0)
Hemoglobin: 14.7 g/dL (ref 12.0–15.0)
Lymphocytes Relative: 35 % (ref 12.0–46.0)
Lymphs Abs: 1.6 K/uL (ref 0.7–4.0)
MCHC: 33.1 g/dL (ref 30.0–36.0)
MCV: 90.4 fl (ref 78.0–100.0)
Monocytes Absolute: 0.3 K/uL (ref 0.1–1.0)
Monocytes Relative: 6.4 % (ref 3.0–12.0)
Neutro Abs: 2.6 K/uL (ref 1.4–7.7)
Neutrophils Relative %: 56.8 % (ref 43.0–77.0)
Platelets: 241 K/uL (ref 150.0–400.0)
RBC: 4.92 Mil/uL (ref 3.87–5.11)
RDW: 13.8 % (ref 11.5–15.5)
WBC: 4.6 K/uL (ref 4.0–10.5)

## 2023-10-25 LAB — COMPREHENSIVE METABOLIC PANEL WITH GFR
ALT: 17 U/L (ref 0–35)
AST: 17 U/L (ref 0–37)
Albumin: 4.7 g/dL (ref 3.5–5.2)
Alkaline Phosphatase: 46 U/L (ref 39–117)
BUN: 11 mg/dL (ref 6–23)
CO2: 29 meq/L (ref 19–32)
Calcium: 10 mg/dL (ref 8.4–10.5)
Chloride: 102 meq/L (ref 96–112)
Creatinine, Ser: 0.68 mg/dL (ref 0.40–1.20)
GFR: 94.84 mL/min (ref >60.00)
Glucose, Bld: 117 mg/dL — ABNORMAL HIGH (ref 70–99)
Potassium: 4.8 meq/L (ref 3.5–5.1)
Sodium: 138 meq/L (ref 135–145)
Total Bilirubin: 1.4 mg/dL — ABNORMAL HIGH (ref 0.2–1.2)
Total Protein: 7 g/dL (ref 6.0–8.3)

## 2023-10-25 LAB — TSH: TSH: 1.99 u[IU]/mL (ref 0.35–5.50)

## 2023-10-25 LAB — HEMOGLOBIN A1C: Hgb A1c MFr Bld: 6.6 % — ABNORMAL HIGH (ref 4.6–6.5)

## 2023-10-25 MED ORDER — GLUCOSE BLOOD VI STRP: ORAL_STRIP | 12 refills | Status: AC

## 2023-10-25 MED ORDER — CONTOUR NEXT GEN MONITOR W/DEVICE KIT
1.0000 | PACK | Freq: Three times a day (TID) | 0 refills | Status: DC
Start: 2023-10-25 — End: 2023-12-14

## 2023-10-25 NOTE — Patient Instructions (Addendum)
 Labs today.  Take care.  Glad to see you. I'll work on Environmental manager.
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
## 2023-10-26 ENCOUNTER — Other Ambulatory Visit: Payer: Self-pay | Admitting: Family Medicine

## 2023-10-26 DIAGNOSIS — R29898 Other symptoms and signs involving the musculoskeletal system: Secondary | ICD-10-CM | POA: Diagnosis not present

## 2023-10-26 DIAGNOSIS — M25511 Pain in right shoulder: Secondary | ICD-10-CM | POA: Diagnosis not present

## 2023-10-26 DIAGNOSIS — M9901 Segmental and somatic dysfunction of cervical region: Secondary | ICD-10-CM | POA: Diagnosis not present

## 2023-10-26 DIAGNOSIS — M9902 Segmental and somatic dysfunction of thoracic region: Secondary | ICD-10-CM | POA: Diagnosis not present

## 2023-10-26 DIAGNOSIS — M9907 Segmental and somatic dysfunction of upper extremity: Secondary | ICD-10-CM | POA: Diagnosis not present

## 2023-10-26 DIAGNOSIS — M542 Cervicalgia: Secondary | ICD-10-CM | POA: Diagnosis not present

## 2023-10-26 MED ORDER — ONETOUCH DELICA PLUS LANCET30G MISC: 3 refills | Status: AC

## 2023-10-27 ENCOUNTER — Ambulatory Visit: Payer: Self-pay | Admitting: Family Medicine

## 2023-10-27 DIAGNOSIS — E119 Type 2 diabetes mellitus without complications: Secondary | ICD-10-CM

## 2023-10-27 NOTE — Assessment & Plan Note (Signed)
 Continue CPAP.

## 2023-10-27 NOTE — Assessment & Plan Note (Addendum)
 History of hyperglycemia.  Continue work on diet.  See notes on labs.  She is going to follow-up with [facility] later this year.

## 2023-10-27 NOTE — Assessment & Plan Note (Signed)
 See scanned forms for extra details.    Parking form done.  She still has limitations from migraine frequency, anxiety, balance difficulty, and photophobia all since her prior concussion.  Her limitations include limited light exposure, not driving at night or in the rain, and limiting noise exposure.  She needs to limit screen time due to photosensitivity.

## 2023-11-24 ENCOUNTER — Encounter: Payer: Self-pay | Admitting: Family Medicine

## 2023-11-25 ENCOUNTER — Other Ambulatory Visit: Payer: Self-pay

## 2023-11-25 DIAGNOSIS — E119 Type 2 diabetes mellitus without complications: Secondary | ICD-10-CM

## 2023-11-25 MED ORDER — MICROLET LANCETS MISC
1.0000 | Freq: Every day | 1 refills | Status: AC
Start: 2023-11-25 — End: ?

## 2023-11-25 MED ORDER — MICROLET LANCETS MISC
1.0000 | Freq: Every day | 1 refills | Status: DC
Start: 2023-11-25 — End: 2023-11-25

## 2023-12-13 ENCOUNTER — Other Ambulatory Visit: Payer: Self-pay | Admitting: Family Medicine

## 2023-12-13 DIAGNOSIS — R739 Hyperglycemia, unspecified: Secondary | ICD-10-CM

## 2023-12-24 ENCOUNTER — Telehealth: Payer: Self-pay | Admitting: Family Medicine

## 2023-12-24 NOTE — Telephone Encounter (Signed)
 Copied from CRM [id number]. Topic: Medical Record Request - Records Request >> Dec 24, 2023 11:27 AM Harlene ORN wrote: Reason for CRM: Ronnald GLENWOOD Passer Legal Attorney's office  calling in to request medical records for the patient  Phone: [phone] ext [phone]

## 2023-12-28 NOTE — Telephone Encounter (Signed)
 Is there a way to check and see if release had been received by medical records? Or is there a number I can give them to call?

## 2024-01-04 NOTE — Telephone Encounter (Signed)
 Left message for [name] to return call. If she calls back please give her the number to medical records to request patient records

## 2024-01-06 NOTE — Telephone Encounter (Signed)
 Left message for [name] to return call. If she calls back please give her the number to medical records to request patient records

## 2024-01-10 ENCOUNTER — Encounter: Payer: Self-pay | Admitting: Family Medicine

## 2024-01-10 ENCOUNTER — Ambulatory Visit: Admitting: Family Medicine

## 2024-01-10 VITALS — BP 118/80 | HR 71 | Temp 98.2°F | Ht 68.0 in | Wt 176.1 lb

## 2024-01-10 DIAGNOSIS — R300 Dysuria: Secondary | ICD-10-CM | POA: Diagnosis not present

## 2024-01-10 LAB — POC URINALSYSI DIPSTICK (AUTOMATED)
Bilirubin, UA: NEGATIVE
Blood, UA: NEGATIVE
Glucose, UA: NEGATIVE
Ketones, UA: NEGATIVE
Nitrite, UA: NEGATIVE
Protein, UA: NEGATIVE
Spec Grav, UA: <=1.005 — AB (ref 1.010–1.025)
Urobilinogen, UA: 0.2 U/dL
pH, UA: 8.5 — AB (ref 5.0–8.0)

## 2024-01-10 NOTE — Patient Instructions (Signed)
 VISIT SUMMARY: Today, you were seen for a possible flare-up of your chronic interstitial cystitis, which has been causing burning, irritation, and pain in your lower abdomen and pelvic area for the past three to four weeks. You also reported increased urinary urgency and frequency, along with occasional blood in your urine. We discussed your current management strategies and reviewed your antibiotic allergies.  YOUR PLAN: -INTERSTITIAL CYSTITIS WITH URINARY TRACT SYMPTOMS: Interstitial cystitis is a chronic condition causing bladder pressure, bladder pain, and sometimes pelvic pain. You are experiencing a flare-up with symptoms like burning, urgency, frequency, and occasional blood in your urine. We will send a urine culture to check for infection. Continue increasing your fluid intake and taking cranberry supplements. Avoid raw cacao and other dietary irritants. We will adjust your treatment plan based on the urine culture results.  -ANTIBIOTIC ALLERGIES: You have allergies to certain antibiotics, including macrobid, clarithromycin, and cefuroxime, but you can tolerate Keflex. We discussed the importance of avoiding antibiotics as able, specifically those above.  -NASH WITH STAGE 2 CIRRHOSIS (IMPROVED): Non-alcoholic steatohepatitis (NASH) is a liver disease that can lead to cirrhosis. Your condition has improved with lifestyle changes, and your liver function tests are now normal. Continue with your current lifestyle modifications. Congratulations!  INSTRUCTIONS: We will send a urine culture to check for infection. Please continue with your current dietary modifications and avoid raw cacao. If the urine culture indicates an infection, we will consider using Keflex for treatment.

## 2024-01-10 NOTE — Telephone Encounter (Signed)
 Closing encounter. Attempted to reach out to [place] to provide contact number with no success

## 2024-01-10 NOTE — Assessment & Plan Note (Signed)
 Recurrent dysuria with other UTI symptoms for 3-4 weeks in h/o interstitial cystitis - presumed interestitial cystitis flare however given duration of flare, prudent to r/o contributory urinary infection. UA/micro without significant concern for UTI, will send off UCx. She prefers to avoid empiric antibiotic treatment until culture results available.

## 2024-01-10 NOTE — Progress Notes (Signed)
 Ph: [phone] Fax: [phone]   Patient ID: [name], female    DOB: 04-09-1966, 58 y.o.   MRN: [id number]  This visit was conducted in person.  BP 118/80   Pulse 71   Temp 98.2 F (36.8 C) (Oral)   Ht 5' 8 (1.727 m)   Wt 176 lb 2 oz (79.9 kg)   SpO2 95%   BMI 26.78 kg/m    Chief Complaint  Patient presents with   Dysuria    C/o pain/burning when urinating and low abd pressure. H/o interstitial cystitis.     Subjective:   Discussed the use of AI scribe software for clinical note transcription with the patient, who gave verbal consent to proceed.  History of Present Illness   [name] is a 59 year old female with chronic interstitial cystitis who presents with UTI symptoms.  She has experienced a flare-up of interstitial cystitis for the past three to four weeks, with burning, irritation, and pain in the lower abdomen and pelvic area. She reports increased urinary urgency and frequency, along with hematuria. There is no back pain, flank pain, fevers, chills, nausea, or vomiting.  She manages her condition with dietary modifications, avoiding irritants such as meat, dairy, and chocolate. She uses cranberry tablets and increases fluid intake, which she finds effective. She has antibiotic intolerances, including rashes from Macrobid, and prefers to avoid antibiotics unless necessary. She has not had a confirmed UTI in years, attributing her improvement to dietary changes.           Relevant past medical, surgical, family and social history reviewed and updated as indicated. Interim medical history since our last visit reviewed. Allergies and medications reviewed and updated. Outpatient Medications Prior to Visit  Medication Sig Dispense Refill   amLODipine  (NORVASC ) 2.5 MG tablet Take 1 tablet (2.5 mg total) by mouth daily as needed.     Blood Glucose Monitoring Suppl (CONTOUR PLUS BLUE) w/Device KIT 1 EACH BY OTHER ROUTE 3 (THREE)  TIMES DAILY. PROVIDE WITH LANCET TO TEST THREE TIMES DAILY 1 kit 0   cholecalciferol (VITAMIN D3) 25 MCG (1000 UNIT) tablet Take 5,000 Units by mouth daily.     Cranberry 1000 MG CAPS Take by mouth daily as needed.     glucose blood test strip Test three times daily 300 each 12   Lancets (ONETOUCH DELICA PLUS LANCET30G) MISC USE AS INSTRUCTED TO CHECK BLOOD SUGAR AS NEEDED. 300 each 3   Magnesium 400 MG CAPS Take by mouth.     Methylcobalamin (B12-ACTIVE PO) Take 1 tablet by mouth daily.     Microlet Lancets MISC 1 each by Other route daily. 100 each 1   vitamin C (ASCORBIC ACID) 250 MG tablet Take 250 mg by mouth daily.     No facility-administered medications prior to visit.     Per HPI unless specifically indicated in ROS section below Review of Systems  Objective:  BP 118/80   Pulse 71   Temp 98.2 F (36.8 C) (Oral)   Ht 5' 8 (1.727 m)   Wt 176 lb 2 oz (79.9 kg)   SpO2 95%   BMI 26.78 kg/m   Wt Readings from Last 3 Encounters:  01/10/24 176 lb 2 oz (79.9 kg)  10/25/23 178 lb 12.8 oz (81.1 kg)  11/20/22 178 lb (80.7 kg)      Physical Exam           Physical Exam Vitals and nursing note reviewed.  Constitutional:      Appearance: Normal appearance. She is not ill-appearing.  Cardiovascular:     Rate and Rhythm: Normal rate and regular rhythm.     Pulses: Normal pulses.     Heart sounds: Normal heart sounds. No murmur heard. Pulmonary:     Effort: Pulmonary effort is normal. No respiratory distress.     Breath sounds: Normal breath sounds. No wheezing, rhonchi or rales.  Abdominal:     General: Bowel sounds are normal. There is no distension.     Palpations: Abdomen is soft. There is no mass.     Tenderness: There is abdominal tenderness (mild) in the suprapubic area. There is no right CVA tenderness, left CVA tenderness, guarding or rebound. Negative signs include Murphy's sign.     Hernia: No hernia is present.  Skin:    General: Skin is warm and dry.   Neurological:     Mental Status: She is alert.  Psychiatric:        Mood and Affect: Mood normal.        Behavior: Behavior normal.       Results   LABS   Urinalysis: Leukocytes present, no hematuria detected (01/10/2024)       Results for orders placed or performed in visit on 01/10/24  POCT Urinalysis Dipstick (Automated)   Collection Time: 01/10/24  4:35 PM  Result Value Ref Range   Color, UA yellow    Clarity, UA clear    Glucose, UA Negative Negative   Bilirubin, UA negative    Ketones, UA negative    Spec Grav, UA <=1.005 (A) 1.010 - 1.025   Blood, UA negative    pH, UA 8.5 (A) 5.0 - 8.0   Protein, UA Negative Negative   Urobilinogen, UA 0.2 0.2 or 1.0 E.U./dL   Nitrite, UA negative    Leukocytes, UA Small (1+) (A) Negative  Micro: WBC 1-5  RBC 1-5  Bact tr  Casts none  Epi 1-5  UCx sent   Assessment & Plan:      Interstitial cystitis with urinary tract symptoms Chronic interstitial cystitis flare-up with burning, urgency, frequency, suprapubic pain, and occasional hematuria. Urinalysis shows leukocytes without infection. Raw cacao potential trigger. - Send urine culture to confirm or rule out infection. - Continue good water intake and cranberry supplements. - Avoid raw cacao and other dietary irritants. - Await urine culture results and adjust treatment plan if necessary. - Keep planned urology f/u (Dr Carolee at [facility]) in November 2025.   Antibiotic allergies Allergic to macrobid, clarithromycin, and cefuroxime. Tolerated Keflex. Discussed concerns about antibiotic resistance and side effects. - Avoid macrobid, clarithromycin, and cefuroxime. - Consider Keflex if antibiotic treatment is necessary.  NASH with stage 2 cirrhosis (improved) NASH with h/o stage 2 cirrhosis improved with lifestyle changes. Liver function tests normalized, liver fibrosis maybe reversed on last assessment. Avoids NSAIDs and Tylenol . - Continue lifestyle  modifications. - Considering rpt liver biopsy in one year through [facility].       Problem List Items Addressed This Visit     Dysuria - Primary   Recurrent dysuria with other UTI symptoms for 3-4 weeks in h/o interstitial cystitis - presumed interestitial cystitis flare however given duration of flare, prudent to r/o contributory urinary infection. UA/micro without significant concern for UTI, will send off UCx. She prefers to avoid empiric antibiotic treatment until culture results available.       Relevant Orders   POCT Urinalysis Dipstick (Automated) (Completed)  Urine Culture     No orders of the defined types were placed in this encounter.   Orders Placed This Encounter  Procedures   Urine Culture   POCT Urinalysis Dipstick (Automated)    Patient Instructions  VISIT SUMMARY: Today, you were seen for a possible flare-up of your chronic interstitial cystitis, which has been causing burning, irritation, and pain in your lower abdomen and pelvic area for the past three to four weeks. You also reported increased urinary urgency and frequency, along with occasional blood in your urine. We discussed your current management strategies and reviewed your antibiotic allergies.  YOUR PLAN: -INTERSTITIAL CYSTITIS WITH URINARY TRACT SYMPTOMS: Interstitial cystitis is a chronic condition causing bladder pressure, bladder pain, and sometimes pelvic pain. You are experiencing a flare-up with symptoms like burning, urgency, frequency, and occasional blood in your urine. We will send a urine culture to check for infection. Continue increasing your fluid intake and taking cranberry supplements. Avoid raw cacao and other dietary irritants. We will adjust your treatment plan based on the urine culture results.  -ANTIBIOTIC ALLERGIES: You have allergies to certain antibiotics, including macrobid, clarithromycin, and cefuroxime, but you can tolerate Keflex. We discussed the importance of  avoiding antibiotics as able, specifically those above.  -NASH WITH STAGE 2 CIRRHOSIS (IMPROVED): Non-alcoholic steatohepatitis (NASH) is a liver disease that can lead to cirrhosis. Your condition has improved with lifestyle changes, and your liver function tests are now normal. Continue with your current lifestyle modifications. Congratulations!  INSTRUCTIONS: We will send a urine culture to check for infection. Please continue with your current dietary modifications and avoid raw cacao. If the urine culture indicates an infection, we will consider using Keflex for treatment.  Follow up plan: No follow-ups on file.  Anton Blas, MD

## 2024-01-11 LAB — URINE CULTURE
MICRO NUMBER:: 17029841
SPECIMEN QUALITY:: ADEQUATE

## 2024-01-13 ENCOUNTER — Encounter: Payer: Self-pay | Admitting: Family Medicine

## 2024-01-13 ENCOUNTER — Ambulatory Visit

## 2024-01-14 ENCOUNTER — Ambulatory Visit: Payer: Self-pay | Admitting: Family Medicine

## 2024-01-20 ENCOUNTER — Other Ambulatory Visit: Payer: Self-pay | Admitting: Family Medicine

## 2024-01-20 DIAGNOSIS — M9901 Segmental and somatic dysfunction of cervical region: Secondary | ICD-10-CM | POA: Diagnosis not present

## 2024-01-20 DIAGNOSIS — I10 Essential (primary) hypertension: Secondary | ICD-10-CM

## 2024-01-20 DIAGNOSIS — M9908 Segmental and somatic dysfunction of rib cage: Secondary | ICD-10-CM | POA: Diagnosis not present

## 2024-01-20 DIAGNOSIS — M546 Pain in thoracic spine: Secondary | ICD-10-CM | POA: Diagnosis not present

## 2024-01-20 DIAGNOSIS — M9902 Segmental and somatic dysfunction of thoracic region: Secondary | ICD-10-CM | POA: Diagnosis not present

## 2024-01-20 DIAGNOSIS — M542 Cervicalgia: Secondary | ICD-10-CM | POA: Diagnosis not present

## 2024-01-20 DIAGNOSIS — G8929 Other chronic pain: Secondary | ICD-10-CM | POA: Diagnosis not present

## 2024-01-20 DIAGNOSIS — M25511 Pain in right shoulder: Secondary | ICD-10-CM | POA: Diagnosis not present

## 2024-01-20 DIAGNOSIS — M9907 Segmental and somatic dysfunction of upper extremity: Secondary | ICD-10-CM | POA: Diagnosis not present

## 2024-01-20 DIAGNOSIS — M9903 Segmental and somatic dysfunction of lumbar region: Secondary | ICD-10-CM | POA: Diagnosis not present

## 2024-01-20 NOTE — Telephone Encounter (Unsigned)
 Copied from CRM [id number]. Topic: Clinical - Medication Refill >> Jan 20, 2024  9:22 AM Jasmin G wrote: Medication: amLODipine  (NORVASC ) 2.5 MG tablet. Pt states that her cardiologist was handling prescription refill but she left clinic.  Has the patient contacted their pharmacy? No (Agent: If no, request that the patient contact the pharmacy for the refill. If patient does not wish to contact the pharmacy document the reason why and proceed with request.) (Agent: If yes, when and what did the pharmacy advise?)  This is the patient's preferred pharmacy:  [id number] [place], [place] - [phone] [place]  [address] [place]  [address] [place] [address] Phone: [phone] Fax: [phone]  Is this the correct pharmacy for this prescription? Yes If no, delete pharmacy and type the correct one.   Has the prescription been filled recently? No  Is the patient out of the medication? Yes  Has the patient been seen for an appointment in the last year OR does the patient have an upcoming appointment? Yes  Can we respond through MyChart? No  Agent: Please be advised that Rx refills may take up to 3 business days. We ask that you follow-up with your pharmacy.

## 2024-01-21 MED ORDER — AMLODIPINE BESYLATE 2.5 MG PO TABS: 2.5000 mg | ORAL_TABLET | Freq: Every day | ORAL | 3 refills | Status: DC | PRN

## 2024-01-24 DIAGNOSIS — G90A Postural orthostatic tachycardia syndrome (POTS): Secondary | ICD-10-CM | POA: Diagnosis not present

## 2024-03-22 ENCOUNTER — Ambulatory Visit (INDEPENDENT_AMBULATORY_CARE_PROVIDER_SITE_OTHER): Admitting: *Deleted

## 2024-03-22 VITALS — Ht 68.0 in | Wt 176.0 lb

## 2024-03-22 DIAGNOSIS — Z0000 Encounter for general adult medical examination without abnormal findings: Secondary | ICD-10-CM | POA: Diagnosis not present

## 2024-03-22 NOTE — Progress Notes (Signed)
 Chief Complaint  Patient presents with   Medicare Wellness     Subjective:   [name] is a 58 y.o. female who presents for a Medicare Annual Wellness Visit.  No voiced or noted concerns at this time Patient advised to keep follow-up appointment with PCP (03-24-2024)   Visit info / Clinical Intake: Medicare Wellness Visit Type:: Subsequent Annual Wellness Visit Persons participating in visit and providing information:: patient Medicare Wellness Visit Mode:: Telephone If telephone:: video declined Since this visit was completed virtually, some vitals may be partially provided or unavailable. Missing vitals are due to the limitations of the virtual format.: Unable to obtain vitals - no equipment If Telephone or Video please confirm:: I connected with patient using audio/video enable telemedicine. I verified patient identity with two identifiers, discussed telehealth limitations, and patient agreed to proceed. Patient Location:: home Provider Location:: home Interpreter Needed?: No Pre-visit prep was completed: no AWV questionnaire completed by patient prior to visit?: yes Date:: 03/20/24 Living arrangements:: lives with spouse/significant other Patient's Overall Health Status Rating: good Typical amount of pain: none Does pain affect daily life?: no Are you currently prescribed opioids?: no  Dietary Habits and Nutritional Risks How many meals a day?: 3 Eats fruit and vegetables daily?: yes Most meals are obtained by: preparing own meals In the last 2 weeks, have you had any of the following?: none Diabetic:: (!) yes Any non-healing wounds?: no How often do you check your BS?: 2 Would you like to be referred to a Nutritionist or for Diabetic Management? : no  Functional Status Activities of Daily Living (to include ambulation/medication): Independent Ambulation: Independent Medication Administration: Independent Home Management (perform basic housework or  laundry): Independent Manage your own finances?: yes Primary transportation is: driving Concerns about vision?: no *vision screening is required for WTM* Concerns about hearing?: no  Fall Screening Falls in the past year?: 1 Number of falls in past year: 0 Was there an injury with Fall?: 0 Fall Risk Category Calculator: 1 Patient Fall Risk Level: Low Fall Risk  Fall Risk Patient at Risk for Falls Due to: No Fall Risks Fall risk Follow up: Falls evaluation completed; Education provided; Falls prevention discussed  Home and Transportation Safety: All rugs have non-skid backing?: (!) no All stairs or steps have railings?: (!) no Grab bars in the bathtub or shower?: yes Have non-skid surface in bathtub or shower?: yes Good home lighting?: yes Regular seat belt use?: yes Hospital stays in the last year:: no  Cognitive Assessment Difficulty concentrating, remembering, or making decisions? : yes Will 6CIT or Mini Cog be Completed: yes What year is it?: 0 points What month is it?: 0 points Give patient an address phrase to remember (5 components): its very sunny outside today in December About what time is it?: 0 points Count backwards from 20 to 1: 0 points Say the months of the year in reverse: 0 points Repeat the address phrase from earlier: 4 points 6 CIT Score: 4 points  Advance Directives (For Healthcare) Does Patient Have a Medical Advance Directive?: Yes Does patient want to make changes to medical advance directive?: No - Patient declined Type of Advance Directive: Healthcare Power of Attorney Copy of Healthcare Power of Attorney in Chart?: No - copy requested  Reviewed/Updated  Reviewed/Updated: Reviewed All (Medical, Surgical, Family, Medications, Allergies, Care Teams, Patient Goals); Surgical History; Family History; Medications; Care Teams; Allergies; Medical History; Patient Goals    Allergies (verified) Nitrofurantoin, Advair diskus [fluticasone -salmeterol],  Barbiturates, Cefuroxime  axetil, Clarithromycin, Codeine, Demerol [meperidine], Food, Gabapentin, Mushroom extract complex (obsolete), and Oxybutynin   Current Medications (verified) Outpatient Encounter Medications as of 03/22/2024  Medication Sig   amLODipine  (NORVASC ) 2.5 MG tablet Take 1 tablet (2.5 mg total) by mouth daily as needed.   Blood Glucose Monitoring Suppl (CONTOUR PLUS BLUE) w/Device KIT 1 EACH BY OTHER ROUTE 3 (THREE) TIMES DAILY. PROVIDE WITH LANCET TO TEST THREE TIMES DAILY   cholecalciferol (VITAMIN D3) 25 MCG (1000 UNIT) tablet Take 5,000 Units by mouth daily.   Cranberry 1000 MG CAPS Take by mouth daily as needed.   estradiol  (ESTRACE ) 0.01 % CREA vaginal cream Place 1 Applicatorful vaginally at bedtime.   glucose blood test strip Test three times daily   Lancets (ONETOUCH DELICA PLUS LANCET30G) MISC USE AS INSTRUCTED TO CHECK BLOOD SUGAR AS NEEDED.   Magnesium 400 MG CAPS Take by mouth.   Methylcobalamin (B12-ACTIVE PO) Take 1 tablet by mouth daily.   Microlet Lancets MISC 1 each by Other route daily.   vitamin C (ASCORBIC ACID) 250 MG tablet Take 250 mg by mouth daily.   No facility-administered encounter medications on file as of 03/22/2024.    History: Past Medical History:  Diagnosis Date   B12 deficiency    Cirrhosis of liver not due to alcohol (HCC)    tx [facility]   Compression fracture of cervical vertebra (HCC)    C5-6   Diabetes mellitus    DIET CONTROLLED   Diverticulitis    04/2008   Dysautonomia (HCC)    Dysrhythmia    POTS (postural orthostatic tachycardia syndrome)   Fatty liver    GERD (gastroesophageal reflux disease)    Hyperlipidemia    Hypertension    Hyperthyroidism    f/u testing neg   Kidney cyst, acquired    benign lesion   Mononucleosis 2005   NASH (nonalcoholic steatohepatitis)    Neuromuscular disorder (HCC)    DR LOVE HAD 1 EPISODE 2010 INVOL MOVEMENT  LT ARM    OSA (obstructive sleep apnea)    Pernicious  anemia    Pneumonia    HX PNEUMONIA    Pneumonia    PONV (postoperative nausea and vomiting)    1994 HYST  WOKE UP     POTS (postural orthostatic tachycardia syndrome)    RLS (restless legs syndrome)    Seizures (HCC)    Sleep apnea    Steatosis of liver    Thyroid  nodule    TIA (transient ischemic attack) 04/2019   [facility]   Treadmill stress test negative for angina pectoris 01/2000   Past Surgical History:  Procedure Laterality Date   ABDOMINAL HYSTERECTOMY     ANKLE SURGERY Right    12/2020   APPENDECTOMY     BLADDER REPAIR  04/13/2001   CARDIAC CATHETERIZATION     12/2010 with Dr. Court, normal   CARDIOVASCULAR STRESS TEST  07/13/2006   Negative, (equivocal), increased uptake of radiotracer mid back   CARDIOVASCULAR STRESS TEST     11/2010, no ischemia   CHOLECYSTECTOMY     04/14/12   IN [facility]   COLONOSCOPY WITH PROPOFOL  N/A 11/05/2014   Procedure: COLONOSCOPY WITH PROPOFOL ;  Surgeon: Gladis RAYMOND Mariner, MD;  Location: [facility] ENDOSCOPY;  Service: Endoscopy;  Laterality: N/A;   DOPPLER ECHOCARDIOGRAPHY     small effusion, but normal o/w   ESOPHAGOGASTRODUODENOSCOPY  03/13/2000   Negative   LUMBAR LAMINECTOMY/DECOMPRESSION MICRODISCECTOMY  04/20/2012   Procedure: LUMBAR LAMINECTOMY/DECOMPRESSION MICRODISCECTOMY 1  LEVEL;  Surgeon: Rockey LITTIE Peru, MD;  Location: [facility] NEURO ORS;  Service: Neurosurgery;  Laterality: Left;  LEFT Lumbar five-Sacral one diskectomy   pubic vaginal sling     2002, 2006   Stress cardiolite  12/13/2002   Negative   TONSILLECTOMY     TONSILLECTOMY     US  abdominal  08/2003 & 01/2005   Fatty liver (both times)   Family History  Problem Relation Age of Onset   Stroke Mother    Hypertension Mother    Heart disease Father        2 MI's   Bladder Cancer Father    Diabetes Other 49   Cancer Other    Hypertension Other    Hypertension Maternal Grandmother    Stroke Maternal Grandmother    Heart disease Maternal Grandfather     Hypertension Maternal Grandfather    Stroke Maternal Grandfather    Heart disease Paternal Grandfather    Colon cancer Neg Hx    Breast cancer Neg Hx    Social History   Occupational History    Comment: Insurance Account Manager  Tobacco Use   Smoking status: Never   Smokeless tobacco: Never  Vaping Use   Vaping status: Never Used  Substance and Sexual Activity   Alcohol use: No    Alcohol/week: 0.0 standard drinks of alcohol   Drug use: No   Sexual activity: Yes   Tobacco Counseling Counseling given: Not Answered  SDOH Screenings   Food Insecurity: No Food Insecurity (03/22/2024)  Housing: Unknown (03/22/2024)  Transportation Needs: No Transportation Needs (03/22/2024)  Utilities: Not At Risk (03/22/2024)  Alcohol Screen: Low Risk  (11/20/2022)  Depression (PHQ2-9): Low Risk  (03/22/2024)  Financial Resource Strain: Low Risk  (03/20/2024)  Physical Activity: Insufficiently Active (03/22/2024)  Social Connections: Socially Integrated (03/22/2024)  Stress: No Stress Concern Present (03/22/2024)  Tobacco Use: Low Risk  (03/22/2024)  Health Literacy: Adequate Health Literacy (03/22/2024)   See flowsheets for full screening details  Depression Screen PHQ 2 & 9 Depression Scale- Over the past 2 weeks, how often have you been bothered by any of the following problems? Little interest or pleasure in doing things: 0 Feeling down, depressed, or hopeless (PHQ Adolescent also includes...irritable): 0 PHQ-2 Total Score: 0 Trouble falling or staying asleep, or sleeping too much: 0 Feeling tired or having little energy: 0 Poor appetite or overeating (PHQ Adolescent also includes...weight loss): 0 Feeling bad about yourself - or that you are a failure or have let yourself or your family down: 0 Trouble concentrating on things, such as reading the newspaper or watching television (PHQ Adolescent also includes...like school work): 0 Moving or speaking so slowly that other people  could have noticed. Or the opposite - being so fidgety or restless that you have been moving around a lot more than usual: 0 Thoughts that you would be better off dead, or of hurting yourself in some way: 0 PHQ-9 Total Score: 0 If you checked off any problems, how difficult have these problems made it for you to do your work, take care of things at home, or get along with other people?: Not difficult at all     Goals Addressed             This Visit's Progress    Weight (lb) < 200 lb (90.7 kg)   176 lb (79.8 kg)            Objective:    Today's Vitals   03/22/24  1527  Weight: 176 lb (79.8 kg)  Height: 5' 8 (1.727 m)   Body mass index is 26.76 kg/m.  Hearing/Vision screen Hearing Screening - Comments:: No trouble hearing Vision Screening - Comments:: [facility]  [facility] Rx Immunizations and Health Maintenance Health Maintenance  Topic Date Due   FOOT EXAM  Never done   Hepatitis C Screening  Never done   Pneumococcal Vaccine: 50+ Years (2 of 2 - PCV) 01/25/2003   DTaP/Tdap/Td (2 - Tdap) 04/13/2016   Mammogram  11/15/2020   OPHTHALMOLOGY EXAM  09/08/2023   Cervical Cancer Screening (HPV/Pap Cotest)  04/25/2024   Influenza Vaccine  07/11/2024 (Originally 11/12/2023)   Diabetic kidney evaluation - Urine ACR  10/05/2027 (Originally 08/29/1981)   HEMOGLOBIN A1C  04/26/2024   Diabetic kidney evaluation - eGFR measurement  10/24/2024   Medicare Annual Wellness (AWV)  03/22/2025   Colonoscopy  09/10/2032   HIV Screening  Completed   Hepatitis B Vaccines 19-59 Average Risk  Aged Out   HPV VACCINES  Aged Out   Meningococcal B Vaccine  Aged Out   COVID-19 Vaccine  Discontinued   Zoster Vaccines- Shingrix  Discontinued        Assessment/Plan:  This is a routine wellness examination for [employer].  Patient Care Team: Cleatus Arlyss RAMAN, MD as PCP - General (Family Medicine) Court Dorn PARAS, MD as PCP - Cardiology (Cardiology) Carolee Sherwood JONETTA DOUGLAS, MD as  Consulting Physician (Urology) Mat Browning, MD as Consulting Physician (Obstetrics and Gynecology) Emeline Alm Endo, MD (Neuro-Ophthalmology)  I have personally reviewed and noted the following in the patients chart:   Medical and social history Use of alcohol, tobacco or illicit drugs  Current medications and supplements including opioid prescriptions. Functional ability and status Nutritional status Physical activity Advanced directives List of other physicians Hospitalizations, surgeries, and ER visits in previous 12 months Vitals Screenings to include cognitive, depression, and falls Referrals and appointments  No orders of the defined types were placed in this encounter.  In addition, I have reviewed and discussed with patient certain preventive protocols, quality metrics, and best practice recommendations. A written personalized care plan for preventive services as well as general preventive health recommendations were provided to patient.   Mliss Graff, LPN   [id number]   Return in 1 year (on 03/22/2025).  After Visit Summary: (MyChart) Due to this being a telephonic visit, the after visit summary with patients personalized plan was offered to patient via MyChart   Nurse Notes:

## 2024-03-22 NOTE — Patient Instructions (Signed)
 Ms. [name],  Thank you for taking the time for your Medicare Wellness Visit. I appreciate your continued commitment to your health goals. Please review the care plan we discussed, and feel free to reach out if I can assist you further.  Please note that Annual Wellness Visits do not include a physical exam. Some assessments may be limited, especially if the visit was conducted virtually. If needed, we may recommend an in-person follow-up with your provider.  Ongoing Care Seeing your primary care provider every 3 to 6 months helps us  monitor your health and provide consistent, personalized care.  Referrals If a referral was made during today's visit and you haven't received any updates within two weeks, please contact the referred provider directly to check on the status.  Recommended Screenings:  Health Maintenance  Topic Date Due   Complete foot exam   Never done   Hepatitis C Screening  Never done   Pneumococcal Vaccine for age over 29 (2 of 2 - PCV) 01/25/2003   DTaP/Tdap/Td vaccine (2 - Tdap) 04/13/2016   Breast Cancer Screening  11/15/2020   Eye exam for diabetics  09/08/2023   Pap with HPV screening  04/25/2024   Flu Shot  07/11/2024*   Yearly kidney health urinalysis for diabetes  10/05/2027*   Hemoglobin A1C  04/26/2024   Yearly kidney function blood test for diabetes  10/24/2024   Medicare Annual Wellness Visit  03/22/2025   Colon Cancer Screening  09/10/2032   HIV Screening  Completed   Hepatitis B Vaccine  Aged Out   HPV Vaccine  Aged Out   Meningitis B Vaccine  Aged Out   COVID-19 Vaccine  Discontinued   Zoster (Shingles) Vaccine  Discontinued  *Topic was postponed. The date shown is not the original due date.       03/22/2024    3:29 PM  Advanced Directives  Does Patient Have a Medical Advance Directive? Yes  Type of Advance Directive Healthcare Power of Attorney  Does patient want to make changes to medical advance directive? No - Patient declined   Copy of Healthcare Power of Attorney in Chart? No - copy requested    Vision: Annual vision screenings are recommended for early detection of glaucoma, cataracts, and diabetic retinopathy. These exams can also reveal signs of chronic conditions such as diabetes and high blood pressure.  Dental: Annual dental screenings help detect early signs of oral cancer, gum disease, and other conditions linked to overall health, including heart disease and diabetes.  Please see the attached documents for additional preventive care recommendations.     Ms. [name] , Thank you for taking time to come for your Medicare Wellness Visit. I appreciate your ongoing commitment to your health goals. Please review the following plan we discussed and let me know if I can assist you in the future.   Screening recommendations/referrals: Colonoscopy:  Mammogram:  Bone Density: Recommended yearly ophthalmology/optometry visit for glaucoma screening and checkup Recommended yearly dental visit for hygiene and checkup  Vaccinations: Influenza vaccine:  Pneumococcal vaccine:  Tdap vaccine:  Shingles vaccine:        Preventive Care 65 Years and Older, Female Preventive care refers to lifestyle choices and visits with your health care provider that can promote health and wellness. What does preventive care include? A yearly physical exam. This is also called an annual well check. Dental exams once or twice a year. Routine eye exams. Ask your health care provider how often you should have your eyes  checked. Personal lifestyle choices, including: Daily care of your teeth and gums. Regular physical activity. Eating a healthy diet. Avoiding tobacco and drug use. Limiting alcohol use. Practicing safe sex. Taking low-dose aspirin  every day. Taking vitamin and mineral supplements as recommended by your health care provider. What happens during an annual well check? The services and screenings done by  your health care provider during your annual well check will depend on your age, overall health, lifestyle risk factors, and family history of disease. Counseling  Your health care provider may ask you questions about your: Alcohol use. Tobacco use. Drug use. Emotional well-being. Home and relationship well-being. Sexual activity. Eating habits. History of falls. Memory and ability to understand (cognition). Work and work astronomer. Reproductive health. Screening  You may have the following tests or measurements: Height, weight, and BMI. Blood pressure. Lipid and cholesterol levels. These may be checked every 5 years, or more frequently if you are over 80 years old. Skin check. Lung cancer screening. You may have this screening every year starting at age 73 if you have a 30-pack-year history of smoking and currently smoke or have quit within the past 15 years. Fecal occult blood test (FOBT) of the stool. You may have this test every year starting at age 6. Flexible sigmoidoscopy or colonoscopy. You may have a sigmoidoscopy every 5 years or a colonoscopy every 10 years starting at age 68. Hepatitis C blood test. Hepatitis B blood test. Sexually transmitted disease (STD) testing. Diabetes screening. This is done by checking your blood sugar (glucose) after you have not eaten for a while (fasting). You may have this done every 1-3 years. Bone density scan. This is done to screen for osteoporosis. You may have this done starting at age 89. Mammogram. This may be done every 1-2 years. Talk to your health care provider about how often you should have regular mammograms. Talk with your health care provider about your test results, treatment options, and if necessary, the need for more tests. Vaccines  Your health care provider may recommend certain vaccines, such as: Influenza vaccine. This is recommended every year. Tetanus, diphtheria, and acellular pertussis (Tdap, Td) vaccine. You  may need a Td booster every 10 years. Zoster vaccine. You may need this after age 55. Pneumococcal 13-valent conjugate (PCV13) vaccine. One dose is recommended after age 95. Pneumococcal polysaccharide (PPSV23) vaccine. One dose is recommended after age 80. Talk to your health care provider about which screenings and vaccines you need and how often you need them. This information is not intended to replace advice given to you by your health care provider. Make sure you discuss any questions you have with your health care provider. Document Released: 04/26/2015 Document Revised: 12/18/2015 Document Reviewed: 01/29/2015 Elsevier Interactive Patient Education  2017 [employer].  Fall Prevention in the Home Falls can cause injuries. They can happen to people of all ages. There are many things you can do to make your home safe and to help prevent falls. What can I do on the outside of my home? Regularly fix the edges of walkways and driveways and fix any cracks. Remove anything that might make you trip as you walk through a door, such as a raised step or threshold. Trim any bushes or trees on the path to your home. Use bright outdoor lighting. Clear any walking paths of anything that might make someone trip, such as rocks or tools. Regularly check to see if handrails are loose or broken. Make sure that both sides  of any steps have handrails. Any raised decks and porches should have guardrails on the edges. Have any leaves, snow, or ice cleared regularly. Use sand or salt on walking paths during winter. Clean up any spills in your garage right away. This includes oil or grease spills. What can I do in the bathroom? Use night lights. Install grab bars by the toilet and in the tub and shower. Do not use towel bars as grab bars. Use non-skid mats or decals in the tub or shower. If you need to sit down in the shower, use a plastic, non-slip stool. Keep the floor dry. Clean up any water that spills  on the floor as soon as it happens. Remove soap buildup in the tub or shower regularly. Attach bath mats securely with double-sided non-slip rug tape. Do not have throw rugs and other things on the floor that can make you trip. What can I do in the bedroom? Use night lights. Make sure that you have a light by your bed that is easy to reach. Do not use any sheets or blankets that are too big for your bed. They should not hang down onto the floor. Have a firm chair that has side arms. You can use this for support while you get dressed. Do not have throw rugs and other things on the floor that can make you trip. What can I do in the kitchen? Clean up any spills right away. Avoid walking on wet floors. Keep items that you use a lot in easy-to-reach places. If you need to reach something above you, use a strong step stool that has a grab bar. Keep electrical cords out of the way. Do not use floor polish or wax that makes floors slippery. If you must use wax, use non-skid floor wax. Do not have throw rugs and other things on the floor that can make you trip. What can I do with my stairs? Do not leave any items on the stairs. Make sure that there are handrails on both sides of the stairs and use them. Fix handrails that are broken or loose. Make sure that handrails are as long as the stairways. Check any carpeting to make sure that it is firmly attached to the stairs. Fix any carpet that is loose or worn. Avoid having throw rugs at the top or bottom of the stairs. If you do have throw rugs, attach them to the floor with carpet tape. Make sure that you have a light switch at the top of the stairs and the bottom of the stairs. If you do not have them, ask someone to add them for you. What else can I do to help prevent falls? Wear shoes that: Do not have high heels. Have rubber bottoms. Are comfortable and fit you well. Are closed at the toe. Do not wear sandals. If you use a stepladder: Make  sure that it is fully opened. Do not climb a closed stepladder. Make sure that both sides of the stepladder are locked into place. Ask someone to hold it for you, if possible. Clearly mark and make sure that you can see: Any grab bars or handrails. First and last steps. Where the edge of each step is. Use tools that help you move around (mobility aids) if they are needed. These include: Canes. Walkers. Scooters. Crutches. Turn on the lights when you go into a dark area. Replace any light bulbs as soon as they burn out. Set up your furniture so you have  a clear path. Avoid moving your furniture around. If any of your floors are uneven, fix them. If there are any pets around you, be aware of where they are. Review your medicines with your doctor. Some medicines can make you feel dizzy. This can increase your chance of falling. Ask your doctor what other things that you can do to help prevent falls. This information is not intended to replace advice given to you by your health care provider. Make sure you discuss any questions you have with your health care provider. Document Released: 01/24/2009 Document Revised: 09/05/2015 Document Reviewed: 05/04/2014 Elsevier Interactive Patient Education  2017 [employer].

## 2024-03-24 ENCOUNTER — Encounter: Payer: Self-pay | Admitting: Family Medicine

## 2024-03-24 ENCOUNTER — Ambulatory Visit: Admitting: Family Medicine

## 2024-03-24 VITALS — BP 112/82 | HR 60 | Temp 98.1°F | Ht 68.0 in | Wt 169.4 lb

## 2024-03-24 DIAGNOSIS — E119 Type 2 diabetes mellitus without complications: Secondary | ICD-10-CM | POA: Diagnosis not present

## 2024-03-24 DIAGNOSIS — G90A Postural orthostatic tachycardia syndrome (POTS): Secondary | ICD-10-CM | POA: Diagnosis not present

## 2024-03-24 DIAGNOSIS — G901 Familial dysautonomia [Riley-Day]: Secondary | ICD-10-CM

## 2024-03-24 LAB — CBC WITH DIFFERENTIAL/PLATELET
Basophils Absolute: 0 K/uL (ref 0.0–0.1)
Basophils Relative: 0.5 % (ref 0.0–3.0)
Eosinophils Absolute: 0.1 K/uL (ref 0.0–0.7)
Eosinophils Relative: 1.3 % (ref 0.0–5.0)
HCT: 43.3 % (ref 36.0–46.0)
Hemoglobin: 14.8 g/dL (ref 12.0–15.0)
Lymphocytes Relative: 37.5 % (ref 12.0–46.0)
Lymphs Abs: 1.8 K/uL (ref 0.7–4.0)
MCHC: 34.1 g/dL (ref 30.0–36.0)
MCV: 89.4 fl (ref 78.0–100.0)
Monocytes Absolute: 0.3 K/uL (ref 0.1–1.0)
Monocytes Relative: 5.7 % (ref 3.0–12.0)
Neutro Abs: 2.6 K/uL (ref 1.4–7.7)
Neutrophils Relative %: 55 % (ref 43.0–77.0)
Platelets: 250 K/uL (ref 150.0–400.0)
RBC: 4.84 Mil/uL (ref 3.87–5.11)
RDW: 13.1 % (ref 11.5–15.5)
WBC: 4.8 K/uL (ref 4.0–10.5)

## 2024-03-24 LAB — COMPREHENSIVE METABOLIC PANEL WITH GFR
ALT: 15 U/L (ref 0–35)
AST: 19 U/L (ref 0–37)
Albumin: 4.6 g/dL (ref 3.5–5.2)
Alkaline Phosphatase: 56 U/L (ref 39–117)
BUN: 12 mg/dL (ref 6–23)
CO2: 30 meq/L (ref 19–32)
Calcium: 9.7 mg/dL (ref 8.4–10.5)
Chloride: 103 meq/L (ref 96–112)
Creatinine, Ser: 0.63 mg/dL (ref 0.40–1.20)
GFR: 96.32 mL/min (ref >60.00)
Glucose, Bld: 106 mg/dL — ABNORMAL HIGH (ref 70–99)
Potassium: 4.1 meq/L (ref 3.5–5.1)
Sodium: 139 meq/L (ref 135–145)
Total Bilirubin: 1.6 mg/dL — ABNORMAL HIGH (ref 0.2–1.2)
Total Protein: 7.4 g/dL (ref 6.0–8.3)

## 2024-03-24 LAB — HEMOGLOBIN A1C: Hgb A1c MFr Bld: 5.9 % (ref 4.6–6.5)

## 2024-03-24 LAB — LIPID PANEL
Cholesterol: 196 mg/dL (ref 0–200)
HDL: 42.3 mg/dL (ref >39.00)
LDL Cholesterol: 116 mg/dL — ABNORMAL HIGH (ref 0–99)
NonHDL: 153.39
Total CHOL/HDL Ratio: 5
Triglycerides: 188 mg/dL — ABNORMAL HIGH (ref 0.0–149.0)
VLDL: 37.6 mg/dL (ref 0.0–40.0)

## 2024-03-24 MED ORDER — PROPRANOLOL HCL 20 MG PO TABS: 10.0000 mg | ORAL_TABLET | Freq: Every day | ORAL | Status: AC | PRN

## 2024-03-24 MED ORDER — PROPRANOLOL HCL 20 MG PO TABS: 20.0000 mg | ORAL_TABLET | Freq: Every day | ORAL | Status: DC | PRN

## 2024-03-24 NOTE — Patient Instructions (Addendum)
 Go to the lab on the way out.   If you have mychart we'll likely use that to update you.     I would still add some salt to your diet at baseline.   If you have elevated heart rate, then I would take 1/2 tab of propranolol  along with extra salt/fluid that day.   Take care.  Glad to see you.  Refer to cardiology.

## 2024-03-24 NOTE — Progress Notes (Signed)
 HA today, seen in room with light dimmed.   Driving cautions d/w pt.  Only driving locally and not on highways.    She fell last week.  She had lower BP at the time, had been walking on a treadmill at the gym.  Had been on the treadmill for about 20 minutes.  Did not have an event while on the treadmill.  Then was on rowing machine and got more lightheaded.  Went to floor from sitting on the rowing machine.  Was nauseated at the time. [place] and BP was 80/60.  BP cautions d/w pt.    Sometimes her BP is 140/90.  She had taken propranolol  the AM of the event.    She has variable BP readings.  She has a lower salt diet at baseline.  D/w pt about inc salt intake at baseline.    D/w pt about trial of 10mg  propranolol  in case of elevated heart rate, along with inc salt intake.    She isn't taking amlodipine .    She has cardiology f/u pending.  Needs referral.  Will establish in [place] .  Referral placed.  H/o DM.  No meds.  Recheck A1c pending.  Meds, vitals, and allergies reviewed.   ROS: Per HPI unless specifically indicated in ROS section   GEN: nad, alert and oriented HEENT: mucous membranes moist NECK: supple w/o LA CV: rrr PULM: ctab, no inc wob ABD: soft, +bs EXT: no edema SKIN: well perfused.  No bruit on carotids.   35 minutes were devoted to patient care in this encounter (this includes time spent reviewing the patient's file/history, interviewing and examining the patient, counseling/reviewing plan with patient).

## 2024-03-26 NOTE — Assessment & Plan Note (Signed)
History of.  See notes on labs. 

## 2024-03-26 NOTE — Assessment & Plan Note (Signed)
 Refer to cardiology. I think it makes sense to add salt to her diet at baseline. If she has elevated heart rate, then I would take 1/2 tab of propranolol  along with extra salt/fluid that day.  Routine cautions given to patient.  She agrees to plan.

## 2024-04-04 ENCOUNTER — Ambulatory Visit: Payer: Self-pay | Admitting: Family Medicine

## 2024-05-12 ENCOUNTER — Encounter: Payer: Self-pay | Admitting: Family Medicine

## 2024-05-12 ENCOUNTER — Ambulatory Visit: Admitting: Family Medicine

## 2024-05-12 VITALS — BP 118/88 | HR 96 | Temp 98.3°F | Ht 68.0 in | Wt 164.2 lb

## 2024-05-12 DIAGNOSIS — R109 Unspecified abdominal pain: Secondary | ICD-10-CM | POA: Diagnosis not present

## 2024-05-12 NOTE — Patient Instructions (Addendum)
 Go to the lab on the way out.   If you have mychart we'll likely use that to update you.    Clear liquids initially.   Soft diet in the meantime if tolerated.   Keep drinking plenty of water.  Take care.  Glad to see you.

## 2024-05-13 LAB — COMPREHENSIVE METABOLIC PANEL WITH GFR
AG Ratio: 1.9 (calc) (ref 1.0–2.5)
ALT: 13 U/L (ref 6–29)
AST: 14 U/L (ref 10–35)
Albumin: 4.6 g/dL (ref 3.6–5.1)
Alkaline phosphatase (APISO): 46 U/L (ref 37–153)
BUN: 15 mg/dL (ref 7–25)
CO2: 30 mmol/L (ref 20–32)
Calcium: 9.7 mg/dL (ref 8.6–10.4)
Chloride: 102 mmol/L (ref 98–110)
Creat: 0.69 mg/dL (ref 0.50–1.05)
Globulin: 2.4 g/dL (ref 1.9–3.7)
Glucose, Bld: 135 mg/dL — ABNORMAL HIGH (ref 65–99)
Potassium: 4.2 mmol/L (ref 3.5–5.3)
Sodium: 138 mmol/L (ref 135–146)
Total Bilirubin: 1.1 mg/dL (ref 0.2–1.2)
Total Protein: 7 g/dL (ref 6.1–8.1)
eGFR: 99 mL/min/{1.73_m2}

## 2024-05-13 LAB — CBC WITH DIFFERENTIAL/PLATELET
Absolute Lymphocytes: 2187 {cells}/uL (ref 850–3900)
Absolute Monocytes: 373 {cells}/uL (ref 200–950)
Basophils Absolute: 22 {cells}/uL (ref 0–200)
Basophils Relative: 0.4 %
Eosinophils Absolute: 49 {cells}/uL (ref 15–500)
Eosinophils Relative: 0.9 %
HCT: 43.8 % (ref 35.9–46.0)
Hemoglobin: 14.5 g/dL (ref 11.7–15.5)
MCH: 30 pg (ref 27.0–33.0)
MCHC: 33.1 g/dL (ref 31.6–35.4)
MCV: 90.7 fL (ref 81.4–101.7)
MPV: 11.2 fL (ref 7.5–12.5)
Monocytes Relative: 6.9 %
Neutro Abs: 2770 {cells}/uL (ref 1500–7800)
Neutrophils Relative %: 51.3 %
Platelets: 256 10*3/uL (ref 140–400)
RBC: 4.83 Million/uL (ref 3.80–5.10)
RDW: 12.1 % (ref 11.0–15.0)
Total Lymphocyte: 40.5 %
WBC: 5.4 10*3/uL (ref 3.8–10.8)

## 2024-05-13 LAB — LIPASE: Lipase: 25 U/L (ref 7–60)

## 2024-05-15 ENCOUNTER — Ambulatory Visit: Payer: Self-pay | Admitting: Family Medicine

## 2024-05-15 DIAGNOSIS — R109 Unspecified abdominal pain: Secondary | ICD-10-CM | POA: Insufficient documentation

## 2024-05-15 NOTE — Assessment & Plan Note (Signed)
 Improved in the meantime.  See notes on labs.  Clear liquids initially.   Soft diet in the meantime if tolerated.   Keep drinking plenty of water.  Routine cautions given to patient.
